# Patient Record
Sex: Female | Born: 1985
Health system: Southern US, Community
[De-identification: ages and names within clinical notes are randomized; demographics above are authoritative.]

## PROBLEM LIST (undated history)

## (undated) ENCOUNTER — Inpatient Hospital Stay (HOSPITAL_COMMUNITY): Payer: Self-pay

## (undated) DIAGNOSIS — F1010 Alcohol abuse, uncomplicated: Secondary | ICD-10-CM

## (undated) DIAGNOSIS — I10 Essential (primary) hypertension: Secondary | ICD-10-CM

## (undated) DIAGNOSIS — F319 Bipolar disorder, unspecified: Secondary | ICD-10-CM

## (undated) DIAGNOSIS — W3400XA Accidental discharge from unspecified firearms or gun, initial encounter: Secondary | ICD-10-CM

## (undated) DIAGNOSIS — K219 Gastro-esophageal reflux disease without esophagitis: Secondary | ICD-10-CM

## (undated) DIAGNOSIS — R011 Cardiac murmur, unspecified: Secondary | ICD-10-CM

## (undated) DIAGNOSIS — K3184 Gastroparesis: Secondary | ICD-10-CM

## (undated) DIAGNOSIS — J302 Other seasonal allergic rhinitis: Secondary | ICD-10-CM

---
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
## 2005-07-15 ENCOUNTER — Emergency Department (HOSPITAL_COMMUNITY): Admission: EM | Admit: 2005-07-15 | Discharge: 2005-07-15 | Payer: Self-pay | Admitting: Emergency Medicine

## 2007-01-09 ENCOUNTER — Emergency Department (HOSPITAL_COMMUNITY): Admission: EM | Admit: 2007-01-09 | Discharge: 2007-01-09 | Payer: Self-pay | Admitting: Family Medicine

## 2008-03-22 ENCOUNTER — Emergency Department (HOSPITAL_COMMUNITY): Admission: EM | Admit: 2008-03-22 | Discharge: 2008-03-22 | Payer: Self-pay | Admitting: Emergency Medicine

## 2009-04-09 ENCOUNTER — Emergency Department (HOSPITAL_COMMUNITY): Admission: EM | Admit: 2009-04-09 | Discharge: 2009-04-09 | Payer: Self-pay | Admitting: Family Medicine

## 2009-04-09 ENCOUNTER — Emergency Department (HOSPITAL_COMMUNITY): Admission: EM | Admit: 2009-04-09 | Discharge: 2009-04-09 | Payer: Self-pay | Admitting: Emergency Medicine

## 2009-04-11 ENCOUNTER — Emergency Department (HOSPITAL_COMMUNITY): Admission: EM | Admit: 2009-04-11 | Discharge: 2009-04-11 | Payer: Self-pay | Admitting: Emergency Medicine

## 2010-05-25 ENCOUNTER — Emergency Department (HOSPITAL_COMMUNITY): Admission: EM | Admit: 2010-05-25 | Discharge: 2010-05-25 | Payer: Self-pay | Admitting: Family Medicine

## 2011-03-02 LAB — URINALYSIS, ROUTINE W REFLEX MICROSCOPIC
Bilirubin Urine: NEGATIVE
Glucose, UA: NEGATIVE mg/dL
Ketones, ur: 15 mg/dL — AB
Nitrite: NEGATIVE
Protein, ur: NEGATIVE mg/dL
Specific Gravity, Urine: 1.02 (ref 1.005–1.030)
Urobilinogen, UA: 0.2 mg/dL (ref 0.0–1.0)
pH: 5.5 (ref 5.0–8.0)

## 2011-03-02 LAB — COMPREHENSIVE METABOLIC PANEL
Albumin: 4.1 g/dL (ref 3.5–5.2)
CO2: 22 mEq/L (ref 19–32)
CO2: 26 mEq/L (ref 19–32)
Calcium: 9.6 mg/dL (ref 8.4–10.5)
Chloride: 105 mEq/L (ref 96–112)
Creatinine, Ser: 0.84 mg/dL (ref 0.4–1.2)
Creatinine, Ser: 0.84 mg/dL (ref 0.4–1.2)
GFR calc Af Amer: >60 mL/min (ref >60)
GFR calc non Af Amer: >60 mL/min (ref >60)
Glucose, Bld: 120 mg/dL — ABNORMAL HIGH (ref 70–99)
Glucose, Bld: 103 mg/dL — ABNORMAL HIGH (ref 70–99)
Potassium: 3.9 mEq/L (ref 3.5–5.1)
Potassium: 3.5 mEq/L (ref 3.5–5.1)
Sodium: 140 mEq/L (ref 135–145)
Total Protein: 7.8 g/dL (ref 6.0–8.3)

## 2011-03-02 LAB — DIFFERENTIAL
Basophils Absolute: 0 10*3/uL (ref 0.0–0.1)
Basophils Relative: 0 % (ref 0–1)
Eosinophils Absolute: 0 10*3/uL (ref 0.0–0.7)
Eosinophils Relative: 0 % (ref 0–5)
Lymphocytes Relative: 15 % (ref 12–46)
Lymphs Abs: 0.8 10*3/uL (ref 0.7–4.0)
Monocytes Absolute: 0.1 10*3/uL (ref 0.1–1.0)
Monocytes Relative: 1 % — ABNORMAL LOW (ref 3–12)
Monocytes Relative: 5 % (ref 3–12)
Neutrophils Relative %: 80 % — ABNORMAL HIGH (ref 43–77)

## 2011-03-02 LAB — CBC
Platelets: 122 10*3/uL — ABNORMAL LOW (ref 150–400)
Platelets: 126 10*3/uL — ABNORMAL LOW (ref 150–400)
RBC: 4.08 MIL/uL (ref 3.87–5.11)
RDW: 13.6 % (ref 11.5–15.5)
WBC: 10.3 10*3/uL (ref 4.0–10.5)
WBC: 9.2 10*3/uL (ref 4.0–10.5)

## 2011-03-02 LAB — POCT URINALYSIS DIP (DEVICE)
Hgb urine dipstick: NEGATIVE
Protein, ur: 30 mg/dL — AB
Urobilinogen, UA: 1 mg/dL (ref 0.0–1.0)

## 2011-03-02 LAB — WET PREP, GENITAL: Trich, Wet Prep: NONE SEEN

## 2011-03-02 LAB — LIPASE, BLOOD: Lipase: 26 U/L (ref 11–59)

## 2011-03-27 ENCOUNTER — Ambulatory Visit (INDEPENDENT_AMBULATORY_CARE_PROVIDER_SITE_OTHER): Payer: Self-pay

## 2011-03-27 ENCOUNTER — Inpatient Hospital Stay (INDEPENDENT_AMBULATORY_CARE_PROVIDER_SITE_OTHER)
Admission: RE | Admit: 2011-03-27 | Discharge: 2011-03-27 | Disposition: A | Discharge status: Home / Self Care | Payer: Self-pay | Source: Ambulatory Visit | Attending: Family Medicine | Admitting: Family Medicine

## 2011-03-27 DIAGNOSIS — R0789 Other chest pain: Secondary | ICD-10-CM

## 2012-03-31 ENCOUNTER — Emergency Department (INDEPENDENT_AMBULATORY_CARE_PROVIDER_SITE_OTHER)
Admission: EM | Admit: 2012-03-31 | Discharge: 2012-03-31 | Disposition: A | Discharge status: Home / Self Care | Payer: Commercial Indemnity | Source: Home / Self Care | Attending: Emergency Medicine | Admitting: Emergency Medicine

## 2012-03-31 ENCOUNTER — Encounter (HOSPITAL_COMMUNITY): Payer: Self-pay

## 2012-03-31 DIAGNOSIS — K5289 Other specified noninfective gastroenteritis and colitis: Secondary | ICD-10-CM

## 2012-03-31 DIAGNOSIS — K529 Noninfective gastroenteritis and colitis, unspecified: Secondary | ICD-10-CM

## 2012-03-31 HISTORY — DX: Other seasonal allergic rhinitis: J30.2

## 2012-03-31 LAB — POCT URINALYSIS DIP (DEVICE)
Glucose, UA: NEGATIVE mg/dL
Protein, ur: 100 mg/dL — AB

## 2012-03-31 LAB — POCT PREGNANCY, URINE: Preg Test, Ur: NEGATIVE

## 2012-03-31 MED ORDER — ACIDOPHILUS PROBIOTIC BLEND PO CAPS: 2.0000 | ORAL_CAPSULE | Freq: Two times a day (BID) | ORAL | Status: DC

## 2012-03-31 MED ORDER — ONDANSETRON 4 MG PO TBDP
4.0000 mg | ORAL_TABLET | Freq: Once | ORAL | Status: AC
  Administered 2012-03-31: 4 mg via ORAL

## 2012-03-31 MED ORDER — ONDANSETRON HCL 4 MG/2ML IJ SOLN
INTRAMUSCULAR | Status: AC
  Filled 2012-03-31: qty 2

## 2012-03-31 MED ORDER — ONDANSETRON 8 MG PO TBDP: ORAL_TABLET | ORAL | Status: AC

## 2012-03-31 MED ORDER — ONDANSETRON 4 MG PO TBDP
ORAL_TABLET | ORAL | Status: AC
  Filled 2012-03-31: qty 1

## 2012-03-31 MED ORDER — ONDANSETRON HCL 4 MG/2ML IJ SOLN
4.0000 mg | Freq: Once | INTRAMUSCULAR | Status: AC
  Administered 2012-03-31: 4 mg via INTRAMUSCULAR

## 2012-03-31 NOTE — Discharge Instructions (Signed)
Return to the ED for the symptoms we discussed.

## 2012-03-31 NOTE — ED Notes (Signed)
C/o upper abdominal area pain and vomiting since yesterday; some ETOH use (last yesterday)

## 2012-03-31 NOTE — ED Provider Notes (Signed)
History     CSN: [id number]  Arrival date & time 03/31/12  1420   First MD Initiated Contact with Patient 03/31/12 1453      Chief Complaint  Patient presents with  . Abdominal Pain    (Consider location/radiation/quality/duration/timing/severity/associated sxs/prior treatment) HPI Comments: Patient reports having one alcoholic beverage, and eating some questionable potato salad last night . Reports multiple episodes of NBNB emesis about 6 hours later, and some upper crampy abdominal pain.  Not sure if others are ill as well. About 2 episodes of watery, nonbloody diarrhea. No abdominal pain. States that she is unable to tolerate any by mouth. No change in urine output. No dizziness, lightheadedness, presyncope, syncope. No chest pain, shortness of breath. No urinary complaints. No back pain. No aggravating or alleviating factors. She's not tried anything for her symptoms. Had some blood flecked emesis while in department. No history of alcohol abuse, pancreatitis, PUD.  ROS as noted in HPI. All other ROS negative.   Patient is a 26 y.o. female presenting with vomiting. The history is provided by the patient. No language interpreter was used.  Emesis  This is a new problem. The current episode started 6 to 12 hours ago. The problem occurs 5 to 10 times per day. The problem has not changed since onset.The emesis has an appearance of bright red blood and stomach contents. There has been no fever. Associated symptoms include abdominal pain. Pertinent negatives include no arthralgias, no cough, no diarrhea, no fever, no headaches, no myalgias and no URI. Risk factors include suspect food intake.    Past Medical History  Diagnosis Date  . Seasonal allergies     History reviewed. No pertinent past surgical history.  History reviewed. No pertinent family history.  History  Substance Use Topics  . Smoking status: Current Everyday Smoker  . Smokeless tobacco: Not on file  . Alcohol Use:  Yes    OB History    Grav Para Term Preterm Abortions TAB SAB Ect Mult Living                  Review of Systems  Constitutional: Negative for fever.  Respiratory: Negative for cough.   Gastrointestinal: Positive for vomiting and abdominal pain. Negative for diarrhea.  Musculoskeletal: Negative for myalgias and arthralgias.  Neurological: Negative for headaches.    Allergies  Review of patient's allergies indicates no known allergies.  Home Medications   Current Outpatient Rx  Name Route Sig Dispense Refill  . ONDANSETRON 8 MG PO TBDP  1/2- 1 tablet q 8 hr prn nausea, vomiting 20 tablet 0  . ACIDOPHILUS PROBIOTIC BLEND PO CAPS Oral Take 2 capsules by mouth 2 (two) times daily. 60 capsule 0    BP 138/98  Pulse 92  Temp(Src) 98 F (36.7 C) (Oral)  Resp 20  SpO2 97%  LMP 03/19/2012  Filed Vitals:   03/31/12 1600 03/31/12 1625 03/31/12 1626 03/31/12 1627  BP: 146/112 140/90 147/100 138/98  Pulse: 84 65 80 92  Temp:      TempSrc:      Resp:      SpO2:         Physical Exam  Nursing note and vitals reviewed. Constitutional: She is oriented to person, place, and time. She appears well-developed and well-nourished.  HENT:  Head: Normocephalic and atraumatic.  Eyes: Conjunctivae and EOM are normal.  Neck: Normal range of motion.  Cardiovascular: Normal rate, regular rhythm, normal heart sounds and intact distal pulses.   Pulmonary/Chest:  Effort normal and breath sounds normal. She exhibits no tenderness.  Abdominal: Soft. Normal appearance and bowel sounds are normal. She exhibits no distension. There is tenderness. There is no rebound, no guarding and no CVA tenderness.    Musculoskeletal: Normal range of motion. She exhibits no edema and no tenderness.  Neurological: She is alert and oriented to person, place, and time.  Skin: Skin is warm and dry.  Psychiatric: She has a normal mood and affect. Her behavior is normal. Judgment and thought content normal.     ED Course  Procedures (including critical care time)  Labs Reviewed  POCT URINALYSIS DIP (DEVICE) - Abnormal; Notable for the following:    Hgb urine dipstick SMALL (*)    Protein, ur 100 (*)    Leukocytes, UA SMALL (*) Biochemical Testing Only. Please order routine urinalysis from main lab if confirmatory testing is needed.   All other components within normal limits  POCT PREGNANCY, URINE   No results found.   1. Gastroenteritis     Results for orders placed during the hospital encounter of 03/31/12  POCT URINALYSIS DIP (DEVICE)      Component Value Range   Glucose, UA NEGATIVE  NEGATIVE (mg/dL)   Bilirubin Urine NEGATIVE  NEGATIVE    Ketones, ur NEGATIVE  NEGATIVE (mg/dL)   Specific Gravity, Urine >=1.030  1.005 - 1.030    Hgb urine dipstick SMALL (*) NEGATIVE    pH 6.0  5.0 - 8.0    Protein, ur 100 (*) NEGATIVE (mg/dL)   Urobilinogen, UA 1.0  0.0 - 1.0 (mg/dL)   Nitrite NEGATIVE  NEGATIVE    Leukocytes, UA SMALL (*) NEGATIVE   POCT PREGNANCY, URINE      Component Value Range   Preg Test, Ur NEGATIVE  NEGATIVE      MDM   patient much improved after the Zofran IM.  Has received 8 mg of Zofran total. Mild abdominal tenderness left upper quadrant, consistent with a gastritis. Abdomen is otherwise benign. She is currently pain-free. Small amount of blood in emesis while in department. This is most likely from repeated vomiting. Doubt pancreatitis, cholelithiasis, diverticulitis, Mallory-Weiss tear, significant UGI bleed at this time. Will check orthostatics, by mouth challenge. If she is able to tolerate this, will send her home with oral rehydration, and Zofran. Otherwise, transferring to the ED for IV fluids. Patient agrees with plan..   Pt orthostatic, urine concentrated. She drank entire can of ginger ale wile in dept. Repeat abd exam benign. Wants to try oral rehydration at home. Discussed signs and symptoms that should prompt patient's return to the ED. Patient  agrees with plan.  Luiz Blare, MD 03/31/12 2239

## 2012-03-31 NOTE — ED Notes (Signed)
Soon after PO zofran given, pt still vomiting

## 2012-07-31 ENCOUNTER — Emergency Department (HOSPITAL_COMMUNITY)
Admission: EM | Admit: 2012-07-31 | Discharge: 2012-07-31 | Disposition: A | Discharge status: Home / Self Care | Payer: Self-pay | Attending: Emergency Medicine | Admitting: Emergency Medicine

## 2012-07-31 ENCOUNTER — Encounter (HOSPITAL_COMMUNITY): Payer: Self-pay | Admitting: Physical Medicine and Rehabilitation

## 2012-07-31 DIAGNOSIS — Z9109 Other allergy status, other than to drugs and biological substances: Secondary | ICD-10-CM | POA: Insufficient documentation

## 2012-07-31 DIAGNOSIS — F1020 Alcohol dependence, uncomplicated: Secondary | ICD-10-CM | POA: Insufficient documentation

## 2012-07-31 DIAGNOSIS — F17200 Nicotine dependence, unspecified, uncomplicated: Secondary | ICD-10-CM | POA: Insufficient documentation

## 2012-07-31 DIAGNOSIS — F1920 Other psychoactive substance dependence, uncomplicated: Secondary | ICD-10-CM | POA: Insufficient documentation

## 2012-07-31 DIAGNOSIS — F1910 Other psychoactive substance abuse, uncomplicated: Secondary | ICD-10-CM

## 2012-07-31 DIAGNOSIS — R030 Elevated blood-pressure reading, without diagnosis of hypertension: Secondary | ICD-10-CM | POA: Insufficient documentation

## 2012-07-31 LAB — COMPREHENSIVE METABOLIC PANEL
ALT: 13 U/L (ref 0–35)
Alkaline Phosphatase: 74 U/L (ref 39–117)
BUN: 12 mg/dL (ref 6–23)
CO2: 26 mEq/L (ref 19–32)
Calcium: 10.3 mg/dL (ref 8.4–10.5)
GFR calc Af Amer: >90 mL/min (ref >90)
GFR calc non Af Amer: >90 mL/min (ref >90)
Glucose, Bld: 89 mg/dL (ref 70–99)
Sodium: 136 mEq/L (ref 135–145)

## 2012-07-31 LAB — CBC WITH DIFFERENTIAL/PLATELET
Eosinophils Relative: 3 % (ref 0–5)
HCT: 41.3 % (ref 36.0–46.0)
Hemoglobin: 14 g/dL (ref 12.0–15.0)
Lymphocytes Relative: 32 % (ref 12–46)
Lymphs Abs: 3 10*3/uL (ref 0.7–4.0)
MCV: 97.9 fL (ref 78.0–100.0)
Monocytes Relative: 8 % (ref 3–12)
Platelets: 170 10*3/uL (ref 150–400)
RBC: 4.22 MIL/uL (ref 3.87–5.11)
WBC: 9.5 10*3/uL (ref 4.0–10.5)

## 2012-07-31 LAB — RAPID URINE DRUG SCREEN, HOSP PERFORMED: Amphetamines: NOT DETECTED

## 2012-07-31 NOTE — ED Notes (Addendum)
Pt presents to department for evaluation of medical clearance. States she started treatment at [facility] today for detox from ETOH and marijuana. Sent here for possible withdrawal symptoms, states she became nauseated and began vomiting after lunch. Upon arrival pt is alert and oriented x4, skin warm and dry. No tremors noted. Denies SI/HI.

## 2012-07-31 NOTE — ED Provider Notes (Signed)
History  This chart was scribed for Doug Sou, MD by Bennett Scrape. This patient was seen in room TR10C/TR10C and the patient's care was started at 5:44PM.  CSN: [id number]  Arrival date & time 07/31/12  1407   First MD Initiated Contact with Patient 07/31/12 1744      Chief Complaint  Patient presents with  . Medical Clearance     The history is provided by the patient. No language interpreter was used.    Taylor Bartlett is a 26 y.o. female who presents to the Emergency Department from [facility] for medical clearance. She reports that she is being seen at [facility] for EtOH and marijuana. She states that she was sent here from [facility] after vomiting after eating a food that typical upsets her stomach which staff thought was a sign of withdrawal. She denies SI, HI and any other symptoms currently. She does not have a h/o chronic medical conditions. She is a current everyday smoker and occasional alcohol user.  Past Medical History  Diagnosis Date  . Seasonal allergies     No past surgical history on file.  No family history on file.  History  Substance Use Topics  . Smoking status: Current Everyday Smoker  . Smokeless tobacco: Not on file  . Alcohol Use: Yes    No OB history provided.  Review of Systems  Constitutional: Negative.   HENT: Negative.   Respiratory: Negative.   Cardiovascular: Negative.   Gastrointestinal: Negative.   Musculoskeletal: Negative.   Skin: Negative.   Neurological: Negative.   Hematological: Negative.   Psychiatric/Behavioral: Negative.     Allergies  Review of patient's allergies indicates no known allergies.  Home Medications  No current outpatient prescriptions on file.  Triage Vitals: BP 139/96  Pulse 78  Temp 98.5 F (36.9 C) (Oral)  Resp 14  SpO2 100%  Physical Exam  Nursing note and vitals reviewed. Constitutional: She appears well-developed and well-nourished.  HENT:  Head: Normocephalic and atraumatic.    Eyes: Conjunctivae are normal. Pupils are equal, round, and reactive to light.  Neck: Neck supple. No tracheal deviation present. No thyromegaly present.  Cardiovascular: Normal rate and regular rhythm.   No murmur heard. Pulmonary/Chest: Effort normal and breath sounds normal.  Abdominal: Soft. Bowel sounds are normal. She exhibits no distension. There is no tenderness.  Musculoskeletal: Normal range of motion. She exhibits no edema and no tenderness.  Neurological: She is alert. Coordination normal.  Skin: Skin is warm and dry. No rash noted.  Psychiatric: She has a normal mood and affect.    ED Course  Procedures (including critical care time)  DIAGNOSTIC STUDIES: Oxygen Saturation is 100% on room air, normal by my interpretation.    COORDINATION OF CARE: 6:00:PM-Discussed treatment plan with pt at bedside and pt agreed to plan.   Labs Reviewed  URINE RAPID DRUG SCREEN (HOSP PERFORMED) - Abnormal; Notable for the following:    Tetrahydrocannabinol POSITIVE (*)     All other components within normal limits  COMPREHENSIVE METABOLIC PANEL - Abnormal; Notable for the following:    Total Protein 8.5 (*)     All other components within normal limits  CBC WITH DIFFERENTIAL  ETHANOL   No results found.   No diagnosis found.  Results for orders placed during the hospital encounter of 07/31/12  URINE RAPID DRUG SCREEN (HOSP PERFORMED)      Component Value Range   Opiates NONE DETECTED  NONE DETECTED   Cocaine NONE DETECTED  NONE DETECTED  Benzodiazepines NONE DETECTED  NONE DETECTED   Amphetamines NONE DETECTED  NONE DETECTED   Tetrahydrocannabinol POSITIVE (*) NONE DETECTED   Barbiturates NONE DETECTED  NONE DETECTED  CBC WITH DIFFERENTIAL      Component Value Range   WBC 9.5  4.0 - 10.5 K/uL   RBC 4.22  3.87 - 5.11 MIL/uL   Hemoglobin 14.0  12.0 - 15.0 g/dL   HCT 16.1  09.6 - 04.5 %   MCV 97.9  78.0 - 100.0 fL   MCH 33.2  26.0 - 34.0 pg   MCHC 33.9  30.0 - 36.0  g/dL   RDW 40.9  81.1 - 91.4 %   Platelets 170  150 - 400 K/uL   Neutrophils Relative 58  43 - 77 %   Neutro Abs 5.5  1.7 - 7.7 K/uL   Lymphocytes Relative 32  12 - 46 %   Lymphs Abs 3.0  0.7 - 4.0 K/uL   Monocytes Relative 8  3 - 12 %   Monocytes Absolute 0.8  0.1 - 1.0 K/uL   Eosinophils Relative 3  0 - 5 %   Eosinophils Absolute 0.3  0.0 - 0.7 K/uL   Basophils Relative 0  0 - 1 %   Basophils Absolute 0.0  0.0 - 0.1 K/uL  COMPREHENSIVE METABOLIC PANEL      Component Value Range   Sodium 136  135 - 145 mEq/L   Potassium 4.0  3.5 - 5.1 mEq/L   Chloride 98  96 - 112 mEq/L   CO2 26  19 - 32 mEq/L   Glucose, Bld 89  70 - 99 mg/dL   BUN 12  6 - 23 mg/dL   Creatinine, Ser 7.82  0.50 - 1.10 mg/dL   Calcium 95.6  8.4 - 21.3 mg/dL   Total Protein 8.5 (*) 6.0 - 8.3 g/dL   Albumin 4.6  3.5 - 5.2 g/dL   AST 23  0 - 37 U/L   ALT 13  0 - 35 U/L   Alkaline Phosphatase 74  39 - 117 U/L   Total Bilirubin 0.4  0.3 - 1.2 mg/dL   GFR calc non Af Amer >90  >90 mL/min   GFR calc Af Amer >90  >90 mL/min  ETHANOL      Component Value Range   Alcohol, Ethyl (B) <11  0 - 11 mg/dL   No results found.   MDM  Denies any abdominal pain states certain types of food makes her vomit she does not feel that she is in withdrawal from substances and she does not appear to be withdrawn from substances and feels well presently She is stable for treatment at [place]. BP should be rechecked in 3 weeks Diagnosis  #1polysubstance abuse #2 elevated blood pressure       I personally performed the services described in this documentation, which was scribed in my presence. The recorded information has been reviewed and considered.   Doug Sou, MD 07/31/12 [phone]

## 2012-08-04 ENCOUNTER — Encounter (HOSPITAL_BASED_OUTPATIENT_CLINIC_OR_DEPARTMENT_OTHER): Payer: Self-pay | Admitting: Emergency Medicine

## 2012-08-04 ENCOUNTER — Emergency Department (HOSPITAL_BASED_OUTPATIENT_CLINIC_OR_DEPARTMENT_OTHER)
Admission: EM | Admit: 2012-08-04 | Discharge: 2012-08-04 | Disposition: A | Discharge status: Home / Self Care | Payer: Self-pay | Attending: Emergency Medicine | Admitting: Emergency Medicine

## 2012-08-04 DIAGNOSIS — F17200 Nicotine dependence, unspecified, uncomplicated: Secondary | ICD-10-CM | POA: Insufficient documentation

## 2012-08-04 DIAGNOSIS — I10 Essential (primary) hypertension: Secondary | ICD-10-CM | POA: Insufficient documentation

## 2012-08-04 MED ORDER — HYDROCHLOROTHIAZIDE 25 MG PO TABS: 12.5000 mg | ORAL_TABLET | Freq: Every day | ORAL | Status: DC

## 2012-08-04 NOTE — ED Provider Notes (Signed)
History     CSN: [id number]  Arrival date & time 08/04/12  1842   First MD Initiated Contact with Patient 08/04/12 2046      Chief Complaint  Patient presents with  . Hypertension    (Consider location/radiation/quality/duration/timing/severity/associated sxs/prior treatment) Patient is a 26 y.o. female presenting with hypertension. The history is provided by the patient. No language interpreter was used.  Hypertension This is a new problem. The current episode started 1 to 4 weeks ago. The problem occurs constantly. The problem has been unchanged. Nothing aggravates the symptoms. She has tried nothing for the symptoms. The treatment provided no relief.  Pt here from [facility].  Pt has had multiple elevated blood pressures. Pt was seen at [facility] 4 days ago.  Pt was seen at [facility] today and sent here for continued elevated blood pressure  Past Medical History  Diagnosis Date  . Seasonal allergies     History reviewed. No pertinent past surgical history.  No family history on file.  History  Substance Use Topics  . Smoking status: Current Every Day Smoker  . Smokeless tobacco: Not on file  . Alcohol Use: Yes    OB History    Grav Para Term Preterm Abortions TAB SAB Ect Mult Living                  Review of Systems  All other systems reviewed and are negative.    Allergies  Tomato  Home Medications   Current Outpatient Rx  Name Route Sig Dispense Refill  . FLUOXETINE HCL 20 MG PO TABS Oral Take 20 mg by mouth daily.    . TRAZODONE HCL 100 MG PO TABS Oral Take 100 mg by mouth at bedtime.      BP 131/87  Pulse 80  Temp 97.9 F (36.6 C) (Oral)  Resp 18  Ht 5\' 2"  (1.575 m)  Wt 104 lb (47.174 kg)  BMI 19.02 kg/m2  SpO2 100%  Physical Exam  Nursing note and vitals reviewed. Constitutional: She appears well-developed and well-nourished.  HENT:  Head: Normocephalic and atraumatic.  Right Ear: External ear normal.  Left Ear: External ear normal.    Nose: Nose normal.  Mouth/Throat: Oropharynx is clear and moist.  Eyes: Conjunctivae normal and EOM are normal. Pupils are equal, round, and reactive to light.  Neck: Normal range of motion. Neck supple.  Cardiovascular: Normal rate.   Pulmonary/Chest: Effort normal and breath sounds normal.  Abdominal: Soft.  Musculoskeletal: Normal range of motion.  Neurological: She is alert.  Skin: Skin is warm.  Psychiatric: She has a normal mood and affect.    ED Course  Procedures (including critical care time)  Labs Reviewed - No data to display No results found.   1. Hypertension       MDM  hctz        Lonia Skinner [place], [place] 08/04/12 2118

## 2012-08-04 NOTE — ED Notes (Signed)
Pt here for high blood pressure x 1 week. Pt is currently in treatment at [facility] for polysubstance abuse. Last pressure taken at [facility] was 156/112

## 2012-08-04 NOTE — ED Notes (Signed)
Patient ambulates to restroom without difficulty or assistance.  

## 2012-08-05 NOTE — ED Provider Notes (Signed)
Medical screening examination/treatment/procedure(s) were performed by non-physician practitioner and as supervising physician I was immediately available for consultation/collaboration.  Doug Sou, MD 08/05/12 [phone]

## 2012-08-07 ENCOUNTER — Encounter (HOSPITAL_BASED_OUTPATIENT_CLINIC_OR_DEPARTMENT_OTHER): Payer: Self-pay | Admitting: *Deleted

## 2012-08-07 ENCOUNTER — Emergency Department (HOSPITAL_BASED_OUTPATIENT_CLINIC_OR_DEPARTMENT_OTHER)
Admission: EM | Admit: 2012-08-07 | Discharge: 2012-08-07 | Disposition: A | Discharge status: Rehabilitation Facility | Payer: Self-pay | Attending: Emergency Medicine | Admitting: Emergency Medicine

## 2012-08-07 DIAGNOSIS — I10 Essential (primary) hypertension: Secondary | ICD-10-CM | POA: Insufficient documentation

## 2012-08-07 DIAGNOSIS — F17200 Nicotine dependence, unspecified, uncomplicated: Secondary | ICD-10-CM | POA: Insufficient documentation

## 2012-08-07 DIAGNOSIS — K219 Gastro-esophageal reflux disease without esophagitis: Secondary | ICD-10-CM | POA: Insufficient documentation

## 2012-08-07 DIAGNOSIS — N390 Urinary tract infection, site not specified: Secondary | ICD-10-CM

## 2012-08-07 DIAGNOSIS — N949 Unspecified condition associated with female genital organs and menstrual cycle: Secondary | ICD-10-CM | POA: Insufficient documentation

## 2012-08-07 HISTORY — DX: Essential (primary) hypertension: I10

## 2012-08-07 HISTORY — DX: Alcohol abuse, uncomplicated: F10.10

## 2012-08-07 LAB — CBC WITH DIFFERENTIAL/PLATELET
Basophils Absolute: 0 10*3/uL (ref 0.0–0.1)
Basophils Relative: 0 % (ref 0–1)
Eosinophils Relative: 1 % (ref 0–5)
HCT: 42.1 % (ref 36.0–46.0)
MCH: 33 pg (ref 26.0–34.0)
MCHC: 35.4 g/dL (ref 30.0–36.0)
MCV: 93.1 fL (ref 78.0–100.0)
Monocytes Absolute: 0.8 10*3/uL (ref 0.1–1.0)
RDW: 11.6 % (ref 11.5–15.5)

## 2012-08-07 LAB — BASIC METABOLIC PANEL
CO2: 27 mEq/L (ref 19–32)
Calcium: 10.3 mg/dL (ref 8.4–10.5)
Creatinine, Ser: 0.8 mg/dL (ref 0.50–1.10)
GFR calc Af Amer: >90 mL/min (ref >90)

## 2012-08-07 LAB — URINALYSIS, ROUTINE W REFLEX MICROSCOPIC
Glucose, UA: NEGATIVE mg/dL
Hgb urine dipstick: NEGATIVE
Ketones, ur: NEGATIVE mg/dL
Protein, ur: 100 mg/dL — AB

## 2012-08-07 LAB — URINE MICROSCOPIC-ADD ON

## 2012-08-07 MED ORDER — PHENAZOPYRIDINE HCL 200 MG PO TABS: 200.0000 mg | ORAL_TABLET | Freq: Three times a day (TID) | ORAL | Status: DC | PRN

## 2012-08-07 MED ORDER — SULFAMETHOXAZOLE-TRIMETHOPRIM 800-160 MG PO TABS: 1.0000 | ORAL_TABLET | Freq: Two times a day (BID) | ORAL | Status: DC

## 2012-08-07 NOTE — ED Notes (Signed)
Patient states she developed bilateral lower pelvic pain today at 0600.  States she developed nausea and vomiting x 3 with some bloody secretions.  Hx of alcoholism and is currently at [facility].  States she has had bloody emesis when she was drinking heavy.

## 2012-08-07 NOTE — ED Provider Notes (Signed)
History     CSN: [id number]  Arrival date & time 08/07/12  1004   First MD Initiated Contact with Patient 08/07/12 1107      Chief Complaint  Patient presents with  . Pelvic Pain    (Consider location/radiation/quality/duration/timing/severity/associated sxs/prior treatment) HPI Comments: Patient is currently at [facility] for inpatient treatment of alcoholism.  She started this morning with lower abdominal pains.  She tried to go to the bathroom and it seemed to worsen.  She denies any fevers or chills.  She denies urinary complaints.  No vaginal bleeding or discharge.  She is sexually active but with females.    Patient is a 26 y.o. female presenting with pelvic pain. The history is provided by the patient.  Pelvic Pain This is a new problem. Episode onset: this morning. The problem occurs constantly. The problem has been gradually worsening. Associated symptoms include abdominal pain. Nothing aggravates the symptoms. Nothing relieves the symptoms. She has tried nothing for the symptoms.    Past Medical History  Diagnosis Date  . Seasonal allergies   . Alcohol abuse   . Hypertension   . Reflux   . Seizures   . Anxiety     History reviewed. No pertinent past surgical history.  No family history on file.  History  Substance Use Topics  . Smoking status: Current Every Day Smoker    Types: Cigarettes  . Smokeless tobacco: Not on file  . Alcohol Use: Yes     clean 07/31/12    OB History    Grav Para Term Preterm Abortions TAB SAB Ect Mult Living                  Review of Systems  Gastrointestinal: Positive for abdominal pain.  Genitourinary: Positive for pelvic pain.  All other systems reviewed and are negative.    Allergies  Tomato  Home Medications   Current Outpatient Rx  Name Route Sig Dispense Refill  . DIPHENHYDRAMINE HCL (SLEEP) 25 MG PO TABS Oral Take 25 mg by mouth every 6 (six) hours.    . GUAIFENESIN ER 600 MG PO TB12 Oral Take 1,200 mg by mouth 2  (two) times daily.    Marland Kitchen OMEPRAZOLE 20 MG PO CPDR Oral Take 20 mg by mouth daily.    Marland Kitchen FLUOXETINE HCL 20 MG PO TABS Oral Take 20 mg by mouth daily.    Marland Kitchen HYDROCHLOROTHIAZIDE 25 MG PO TABS Oral Take 0.5 tablets (12.5 mg total) by mouth daily. 30 tablet 1  . TRAZODONE HCL 100 MG PO TABS Oral Take 100 mg by mouth at bedtime.      BP 168/103  Pulse 84  Temp 98.3 F (36.8 C) (Oral)  Resp 18  Ht 5\' 2"  (1.575 m)  Wt 103 lb (46.72 kg)  BMI 18.84 kg/m2  SpO2 100%  LMP 07/11/2012  Physical Exam  Nursing note and vitals reviewed. Constitutional: She is oriented to person, place, and time. She appears well-developed and well-nourished. No distress.  HENT:  Head: Normocephalic and atraumatic.  Mouth/Throat: Oropharynx is clear and moist.  Neck: Normal range of motion.  Cardiovascular: Normal rate and regular rhythm.   No murmur heard. Pulmonary/Chest: Effort normal and breath sounds normal.  Abdominal: Soft. Bowel sounds are normal. She exhibits no distension.       There is ttp in the suprapubic region and right lower quadrant with no rebound or guarding.  Bowel sounds are present.  Musculoskeletal: Normal range of motion. She exhibits no edema.  Neurological: She is alert and oriented to person, place, and time.  Skin: Skin is warm and dry. She is not diaphoretic.    ED Course  Procedures (including critical care time)   Labs Reviewed  PREGNANCY, URINE  URINALYSIS, ROUTINE W REFLEX MICROSCOPIC  CBC WITH DIFFERENTIAL  BASIC METABOLIC PANEL   No results found.   No diagnosis found.    MDM  The patient presents here with suprapubic discomfort and a ua that suggests a uti.  There is no elevation of wbc to suggest appendicitis and she denies any vaginal discharge or bleeding.  She will be treated with bactrim and pyridium, to return prn if her symptoms worsen.        Geoffery Lyons, MD 08/07/12 1236

## 2013-03-29 ENCOUNTER — Emergency Department (HOSPITAL_COMMUNITY)
Admission: EM | Admit: 2013-03-29 | Discharge: 2013-03-30 | Disposition: A | Discharge status: Home / Self Care | Payer: 59 | Attending: Emergency Medicine | Admitting: Emergency Medicine

## 2013-03-29 ENCOUNTER — Encounter (HOSPITAL_COMMUNITY): Payer: Self-pay | Admitting: Emergency Medicine

## 2013-03-29 ENCOUNTER — Emergency Department (HOSPITAL_COMMUNITY)
Admission: EM | Admit: 2013-03-29 | Discharge: 2013-03-29 | Disposition: A | Discharge status: Nursing Home (Includes ICF) | Payer: 59 | Source: Home / Self Care | Attending: Family Medicine | Admitting: Family Medicine

## 2013-03-29 DIAGNOSIS — F17200 Nicotine dependence, unspecified, uncomplicated: Secondary | ICD-10-CM | POA: Insufficient documentation

## 2013-03-29 DIAGNOSIS — R112 Nausea with vomiting, unspecified: Secondary | ICD-10-CM | POA: Insufficient documentation

## 2013-03-29 DIAGNOSIS — Z8719 Personal history of other diseases of the digestive system: Secondary | ICD-10-CM | POA: Insufficient documentation

## 2013-03-29 DIAGNOSIS — N390 Urinary tract infection, site not specified: Secondary | ICD-10-CM | POA: Insufficient documentation

## 2013-03-29 DIAGNOSIS — R197 Diarrhea, unspecified: Secondary | ICD-10-CM

## 2013-03-29 DIAGNOSIS — R1084 Generalized abdominal pain: Secondary | ICD-10-CM | POA: Insufficient documentation

## 2013-03-29 DIAGNOSIS — I10 Essential (primary) hypertension: Secondary | ICD-10-CM | POA: Insufficient documentation

## 2013-03-29 DIAGNOSIS — Z8659 Personal history of other mental and behavioral disorders: Secondary | ICD-10-CM | POA: Insufficient documentation

## 2013-03-29 DIAGNOSIS — A0811 Acute gastroenteropathy due to Norwalk agent: Secondary | ICD-10-CM

## 2013-03-29 DIAGNOSIS — Z8669 Personal history of other diseases of the nervous system and sense organs: Secondary | ICD-10-CM | POA: Insufficient documentation

## 2013-03-29 LAB — POCT I-STAT, CHEM 8
BUN: 12 mg/dL (ref 6–23)
Chloride: 109 mEq/L (ref 96–112)
Creatinine, Ser: 0.6 mg/dL (ref 0.50–1.10)
Glucose, Bld: 109 mg/dL — ABNORMAL HIGH (ref 70–99)
Hemoglobin: 13.9 g/dL (ref 12.0–15.0)
Potassium: 3.6 mEq/L (ref 3.5–5.1)

## 2013-03-29 LAB — CBC WITH DIFFERENTIAL/PLATELET
Basophils Relative: 0 % (ref 0–1)
HCT: 36.7 % (ref 36.0–46.0)
Hemoglobin: 13 g/dL (ref 12.0–15.0)
Lymphs Abs: 1.4 10*3/uL (ref 0.7–4.0)
MCHC: 35.4 g/dL (ref 30.0–36.0)
Monocytes Absolute: 0.2 10*3/uL (ref 0.1–1.0)
Monocytes Relative: 2 % — ABNORMAL LOW (ref 3–12)
Neutro Abs: 6.4 10*3/uL (ref 1.7–7.7)

## 2013-03-29 LAB — URINE MICROSCOPIC-ADD ON

## 2013-03-29 LAB — URINALYSIS, ROUTINE W REFLEX MICROSCOPIC
Bilirubin Urine: NEGATIVE
Glucose, UA: NEGATIVE mg/dL
Hgb urine dipstick: NEGATIVE
Specific Gravity, Urine: 1.029 (ref 1.005–1.030)
pH: 6.5 (ref 5.0–8.0)

## 2013-03-29 LAB — PREGNANCY, URINE: Preg Test, Ur: NEGATIVE

## 2013-03-29 MED ORDER — MORPHINE SULFATE 4 MG/ML IJ SOLN
4.0000 mg | Freq: Once | INTRAMUSCULAR | Status: AC
  Administered 2013-03-29: 4 mg via INTRAVENOUS
  Filled 2013-03-29: qty 1

## 2013-03-29 MED ORDER — ONDANSETRON HCL 4 MG/2ML IJ SOLN
4.0000 mg | Freq: Once | INTRAMUSCULAR | Status: AC
  Administered 2013-03-29: 4 mg via INTRAVENOUS
  Filled 2013-03-29: qty 2

## 2013-03-29 MED ORDER — SODIUM CHLORIDE 0.9 % IV BOLUS (SEPSIS)
2000.0000 mL | Freq: Once | INTRAVENOUS | Status: AC
  Administered 2013-03-29: 2000 mL via INTRAVENOUS

## 2013-03-29 MED ORDER — ONDANSETRON HCL 4 MG/2ML IJ SOLN: 4.0000 mg | Freq: Once | INTRAMUSCULAR | Status: DC

## 2013-03-29 MED ORDER — CEPHALEXIN 250 MG PO CAPS
500.0000 mg | ORAL_CAPSULE | Freq: Once | ORAL | Status: AC
  Administered 2013-03-30: 500 mg via ORAL
  Filled 2013-03-29: qty 2

## 2013-03-29 NOTE — ED Notes (Signed)
Tolerating ice chips without problems.

## 2013-03-29 NOTE — ED Notes (Signed)
Pt reports nausea and vomiting since this morning. Chills. Pt denies fever and diarrhea. Pt has not tried any otc meds for symptoms.

## 2013-03-29 NOTE — ED Notes (Signed)
Pt advised that urine sample needed. States she does not feel like she can go to the [facility] right now

## 2013-03-29 NOTE — ED Provider Notes (Signed)
History     CSN: [id number]  Arrival date & time 03/29/13  1945   First MD Initiated Contact with Patient 03/29/13 2008      Chief Complaint  Patient presents with  . Nausea  . Emesis    (Consider location/radiation/quality/duration/timing/severity/associated sxs/prior treatment) HPI Taylor Bartlett is a 27 y.o. female who presents to ED with complaint of nausea, vomiting, diarrhea onset this morning. States started throwing up this morning, not improving. Stats now diffuse abdominal pain. Pain is "crampy" constant, worsened with vomiting. No fever. No urinary symptoms. No vaginal discharge or bleeding. Denies pregnancy. Has not tried any medications for this. Went to [facility] sent here. Denies drugs or alcohol recently. States hx of both.   Past Medical History  Diagnosis Date  . Seasonal allergies   . Alcohol abuse   . Hypertension   . Reflux   . Seizures   . Anxiety     History reviewed. No pertinent past surgical history.  No family history on file.  History  Substance Use Topics  . Smoking status: Current Every Day Smoker    Types: Cigarettes  . Smokeless tobacco: Not on file  . Alcohol Use: Yes     Comment: clean 07/31/12    OB History   Grav Para Term Preterm Abortions TAB SAB Ect Mult Living                  Review of Systems  Constitutional: Negative for fever and chills.  Respiratory: Negative.   Cardiovascular: Negative.   Gastrointestinal: Positive for nausea, vomiting, abdominal pain and diarrhea. Negative for constipation and blood in stool.  Genitourinary: Negative for urgency, frequency, flank pain, vaginal bleeding, vaginal discharge and vaginal pain.  Neurological: Negative for seizures and weakness.  All other systems reviewed and are negative.    Allergies  Tomato  Home Medications  No current outpatient prescriptions on file.  BP 164/81  Pulse 58  Temp(Src) 97.7 F (36.5 C) (Oral)  Ht 5\' 2"  (1.575 m)  Wt 115 lb (52.164 kg)  BMI  21.03 kg/m2  SpO2 100%  LMP 02/27/2013  Physical Exam  Nursing note and vitals reviewed. Constitutional: She appears well-developed and well-nourished. No distress.  Eyes: Conjunctivae are normal.  Neck: Neck supple.  Cardiovascular: Normal rate, regular rhythm and normal heart sounds.   Pulmonary/Chest: Effort normal and breath sounds normal. No respiratory distress. She has no wheezes. She has no rales.  Abdominal: Soft. Bowel sounds are normal. She exhibits no distension. There is tenderness. There is no rebound and no guarding.  Diffuse tenderness over abdomen. No cva tenderness  Neurological: She is alert.  Skin: Skin is warm and dry.    ED Course  Procedures (including critical care time)  Pt with hx of alcohol abuse, here with nausea, vomiting, diarrhea. Diffuse abdominal pain. Labs pending. Will add LFTs and UA, not ordered up front. Fluids ordered. zofran and mrophine for pain. VS normal at this time other than slight htn at 151/88.   Results for orders placed during the hospital encounter of 03/29/13  CBC WITH DIFFERENTIAL      Result Value Range   WBC 7.9  4.0 - 10.5 K/uL   RBC 3.97  3.87 - 5.11 MIL/uL   Hemoglobin 13.0  12.0 - 15.0 g/dL   HCT 40.9  81.1 - 91.4 %   MCV 92.4  78.0 - 100.0 fL   MCH 32.7  26.0 - 34.0 pg   MCHC 35.4  30.0 -  36.0 g/dL   RDW 40.9  81.1 - 91.4 %   Platelets 168  150 - 400 K/uL   Neutrophils Relative 81 (*) 43 - 77 %   Neutro Abs 6.4  1.7 - 7.7 K/uL   Lymphocytes Relative 17  12 - 46 %   Lymphs Abs 1.4  0.7 - 4.0 K/uL   Monocytes Relative 2 (*) 3 - 12 %   Monocytes Absolute 0.2  0.1 - 1.0 K/uL   Eosinophils Relative 0  0 - 5 %   Eosinophils Absolute 0.0  0.0 - 0.7 K/uL   Basophils Relative 0  0 - 1 %   Basophils Absolute 0.0  0.0 - 0.1 K/uL  LIPASE, BLOOD      Result Value Range   Lipase 13  11 - 59 U/L  URINALYSIS, ROUTINE W REFLEX MICROSCOPIC      Result Value Range   Color, Urine YELLOW  YELLOW   APPearance CLOUDY (*) CLEAR    Specific Gravity, Urine 1.029  1.005 - 1.030   pH 6.5  5.0 - 8.0   Glucose, UA NEGATIVE  NEGATIVE mg/dL   Hgb urine dipstick NEGATIVE  NEGATIVE   Bilirubin Urine NEGATIVE  NEGATIVE   Ketones, ur >80 (*) NEGATIVE mg/dL   Protein, ur NEGATIVE  NEGATIVE mg/dL   Urobilinogen, UA 0.2  0.0 - 1.0 mg/dL   Nitrite NEGATIVE  NEGATIVE   Leukocytes, UA MODERATE (*) NEGATIVE  PREGNANCY, URINE      Result Value Range   Preg Test, Ur NEGATIVE  NEGATIVE  HEPATIC FUNCTION PANEL      Result Value Range   Total Protein 6.8  6.0 - 8.3 g/dL   Albumin 3.8  3.5 - 5.2 g/dL   AST 22  0 - 37 U/L   ALT 12  0 - 35 U/L   Alkaline Phosphatase 53  39 - 117 U/L   Total Bilirubin 0.3  0.3 - 1.2 mg/dL   Bilirubin, Direct <7.8  0.0 - 0.3 mg/dL   Indirect Bilirubin NOT CALCULATED  0.3 - 0.9 mg/dL  URINE MICROSCOPIC-ADD ON      Result Value Range   Squamous Epithelial / LPF RARE  RARE   WBC, UA 7-10  <3 WBC/hpf   RBC / HPF 0-2  <3 RBC/hpf   Bacteria, UA FEW (*) RARE   Urine-Other MUCOUS PRESENT    POCT I-STAT, CHEM 8      Result Value Range   Sodium 141  135 - 145 mEq/L   Potassium 3.6  3.5 - 5.1 mEq/L   Chloride 109  96 - 112 mEq/L   BUN 12  6 - 23 mg/dL   Creatinine, Ser 2.95  0.50 - 1.10 mg/dL   Glucose, Bld 621 (*) 70 - 99 mg/dL   Calcium, Ion 3.08  6.57 - 1.23 mmol/L   TCO2 19  0 - 100 mmol/L   Hemoglobin 13.9  12.0 - 15.0 g/dL   HCT 84.6  96.2 - 95.2 %   No results found.   1. Nausea vomiting and diarrhea   2. UTI (lower urinary tract infection)       MDM  Pt with n/v/d, diffuse abdominal pain. Pain resolved with 4mg  morphine IV. Her nausea improved with zofran. She feels much better. Pt given 2L of NS. Her labs did indicate dehydration with >80 ketones. Her CBC and LFTs normal. Her ua shows infection. Pt treated with keflex 500mg  PO in ED. She will be discharged home with  keflex and phenergan for nausea. Her abdomen reassessed and is non tender. She is tolerating PO fluids in ED. She has no  complaints. Suspect most likely viral gastroenteritis. Plan to d/c home with follow up. Return precautions given.    Filed Vitals:   03/29/13 1950 03/29/13 2100  BP: 151/88 164/81  Pulse: 63 58  Temp: 97.7 F (36.5 C)   TempSrc: Oral   Height: 5\' 2"  (1.575 m)   Weight: 115 lb (52.164 kg)   SpO2: 100% 100%        Lottie Mussel, PA-C 03/30/13 0459

## 2013-03-29 NOTE — ED Notes (Signed)
Pt reporting feeling better and  hungry. Per Osie Bond, PA pt can have a few ice chips. Pt given a few ice chips.

## 2013-03-29 NOTE — ED Notes (Addendum)
Patient with nausea and vomiting with diarrhea since this morning.  Patient seen at [facility].  Patient states she feels like she is going to pass out.  Patient actively vomiting in triage.  Patient states that she has been unable to keep food down and vomiting yellow emesis.

## 2013-03-29 NOTE — ED Provider Notes (Signed)
History     CSN: [id number]  Arrival date & time 03/29/13  1816   First MD Initiated Contact with Patient 03/29/13 1930      Chief Complaint  Patient presents with  . Emesis    nausea and vomiting since this morning.     (Consider location/radiation/quality/duration/timing/severity/associated sxs/prior treatment) Patient is a 27 y.o. female presenting with vomiting. The history is provided by the patient.  Emesis Severity:  Moderate Duration:  10 hours Timing:  Constant Quality:  Bilious material Progression:  Unchanged Chronicity:  New Associated symptoms: abdominal pain, chills and diarrhea     Past Medical History  Diagnosis Date  . Seasonal allergies   . Alcohol abuse   . Hypertension   . Reflux   . Seizures   . Anxiety     History reviewed. No pertinent past surgical history.  History reviewed. No pertinent family history.  History  Substance Use Topics  . Smoking status: Current Every Day Smoker    Types: Cigarettes  . Smokeless tobacco: Not on file  . Alcohol Use: Yes     Comment: clean 07/31/12    OB History   Grav Para Term Preterm Abortions TAB SAB Ect Mult Living                  Review of Systems  Constitutional: Positive for chills.  Gastrointestinal: Positive for nausea, vomiting, abdominal pain and diarrhea. Negative for blood in stool.  Skin: Negative.     Allergies  Tomato  Home Medications   Current Outpatient Rx  Name  Route  Sig  Dispense  Refill  . diphenhydrAMINE (SOMINEX) 25 MG tablet   Oral   Take 25 mg by mouth every 6 (six) hours.         Marland Kitchen FLUoxetine (PROZAC) 20 MG tablet   Oral   Take 20 mg by mouth daily.         Marland Kitchen guaiFENesin (MUCINEX) 600 MG 12 hr tablet   Oral   Take 1,200 mg by mouth 2 (two) times daily.         . hydrochlorothiazide (HYDRODIURIL) 25 MG tablet   Oral   Take 0.5 tablets (12.5 mg total) by mouth daily.   30 tablet   1   . omeprazole (PRILOSEC) 20 MG capsule   Oral   Take 20 mg  by mouth daily.         . phenazopyridine (PYRIDIUM) 200 MG tablet   Oral   Take 1 tablet (200 mg total) by mouth 3 (three) times daily as needed for pain.   6 tablet   0   . sulfamethoxazole-trimethoprim (SEPTRA DS) 800-160 MG per tablet   Oral   Take 1 tablet by mouth every 12 (twelve) hours.   10 tablet   0   . traZODone (DESYREL) 100 MG tablet   Oral   Take 100 mg by mouth at bedtime.           BP 169/91  Pulse 55  Temp(Src) 97.4 F (36.3 C) (Oral)  Resp 16  SpO2 100%  LMP 02/27/2013  Physical Exam  Nursing note and vitals reviewed. Constitutional: She is oriented to person, place, and time. She appears well-developed and well-nourished.  HENT:  Head: Normocephalic.  Right Ear: External ear normal.  Left Ear: External ear normal.  Mouth/Throat: Oropharynx is clear and moist.  Abdominal: Soft. Bowel sounds are normal. She exhibits no distension and no mass. There is no hepatosplenomegaly. There is tenderness  in the epigastric area. There is no rebound, no guarding and no CVA tenderness.  Neurological: She is alert and oriented to person, place, and time.  Skin: Skin is warm and dry.    ED Course  Procedures (including critical care time)  Labs Reviewed - No data to display No results found.   1. Gastroenteritis due to norovirus       MDM  Sent for ivf and meds, protracted n/v/d Today.        Linna Hoff, MD 03/29/13 [phone]

## 2013-03-30 LAB — HEPATIC FUNCTION PANEL
ALT: 12 U/L (ref 0–35)
AST: 22 U/L (ref 0–37)
Albumin: 3.8 g/dL (ref 3.5–5.2)
Bilirubin, Direct: <0.1 mg/dL (ref 0.0–0.3)
Total Bilirubin: 0.3 mg/dL (ref 0.3–1.2)

## 2013-03-30 MED ORDER — CEPHALEXIN 500 MG PO CAPS: 500.0000 mg | ORAL_CAPSULE | Freq: Two times a day (BID) | ORAL | Status: DC

## 2013-03-30 MED ORDER — PROMETHAZINE HCL 25 MG PO TABS: 25.0000 mg | ORAL_TABLET | Freq: Four times a day (QID) | ORAL | Status: DC | PRN

## 2013-03-30 NOTE — ED Provider Notes (Signed)
Medical screening examination/treatment/procedure(s) were performed by non-physician practitioner and as supervising physician I was immediately available for consultation/collaboration.   Mina Babula, MD 03/30/13 1729 

## 2013-03-31 LAB — URINE CULTURE

## 2013-05-09 ENCOUNTER — Encounter (HOSPITAL_COMMUNITY): Payer: Self-pay | Admitting: Emergency Medicine

## 2013-05-09 ENCOUNTER — Emergency Department (HOSPITAL_COMMUNITY)
Admission: EM | Admit: 2013-05-09 | Discharge: 2013-05-09 | Disposition: A | Discharge status: Home / Self Care | Payer: 59 | Source: Home / Self Care | Attending: Emergency Medicine | Admitting: Emergency Medicine

## 2013-05-09 ENCOUNTER — Emergency Department (HOSPITAL_COMMUNITY)
Admission: EM | Admit: 2013-05-09 | Discharge: 2013-05-09 | Disposition: A | Discharge status: Home / Self Care | Payer: 59 | Attending: Emergency Medicine | Admitting: Emergency Medicine

## 2013-05-09 DIAGNOSIS — R109 Unspecified abdominal pain: Secondary | ICD-10-CM

## 2013-05-09 DIAGNOSIS — I10 Essential (primary) hypertension: Secondary | ICD-10-CM | POA: Insufficient documentation

## 2013-05-09 DIAGNOSIS — K219 Gastro-esophageal reflux disease without esophagitis: Secondary | ICD-10-CM | POA: Insufficient documentation

## 2013-05-09 DIAGNOSIS — F411 Generalized anxiety disorder: Secondary | ICD-10-CM | POA: Insufficient documentation

## 2013-05-09 DIAGNOSIS — K5289 Other specified noninfective gastroenteritis and colitis: Secondary | ICD-10-CM | POA: Insufficient documentation

## 2013-05-09 DIAGNOSIS — F17200 Nicotine dependence, unspecified, uncomplicated: Secondary | ICD-10-CM | POA: Insufficient documentation

## 2013-05-09 DIAGNOSIS — G40909 Epilepsy, unspecified, not intractable, without status epilepticus: Secondary | ICD-10-CM | POA: Insufficient documentation

## 2013-05-09 DIAGNOSIS — E860 Dehydration: Secondary | ICD-10-CM | POA: Insufficient documentation

## 2013-05-09 DIAGNOSIS — R197 Diarrhea, unspecified: Secondary | ICD-10-CM | POA: Insufficient documentation

## 2013-05-09 DIAGNOSIS — R112 Nausea with vomiting, unspecified: Secondary | ICD-10-CM

## 2013-05-09 DIAGNOSIS — F1010 Alcohol abuse, uncomplicated: Secondary | ICD-10-CM | POA: Insufficient documentation

## 2013-05-09 DIAGNOSIS — K529 Noninfective gastroenteritis and colitis, unspecified: Secondary | ICD-10-CM

## 2013-05-09 LAB — POCT URINALYSIS DIP (DEVICE)
Bilirubin Urine: NEGATIVE
Ketones, ur: NEGATIVE mg/dL
Leukocytes, UA: NEGATIVE
Protein, ur: 30 mg/dL — AB
pH: 6.5 (ref 5.0–8.0)

## 2013-05-09 MED ORDER — SODIUM CHLORIDE 0.9 % IV BOLUS (SEPSIS)
1000.0000 mL | Freq: Once | INTRAVENOUS | Status: AC
  Administered 2013-05-09: 1000 mL via INTRAVENOUS

## 2013-05-09 MED ORDER — FENTANYL CITRATE 0.05 MG/ML IJ SOLN
50.0000 ug | Freq: Once | INTRAMUSCULAR | Status: AC
  Administered 2013-05-09: 50 ug via INTRAVENOUS
  Filled 2013-05-09: qty 2

## 2013-05-09 MED ORDER — ONDANSETRON HCL 4 MG/2ML IJ SOLN
4.0000 mg | Freq: Once | INTRAMUSCULAR | Status: AC
  Administered 2013-05-09: 4 mg via INTRAVENOUS
  Filled 2013-05-09: qty 2

## 2013-05-09 MED ORDER — ONDANSETRON HCL 4 MG/2ML IJ SOLN
4.0000 mg | Freq: Once | INTRAMUSCULAR | Status: AC
  Administered 2013-05-09: 4 mg via INTRAMUSCULAR

## 2013-05-09 MED ORDER — ONDANSETRON HCL 4 MG/2ML IJ SOLN
INTRAMUSCULAR | Status: AC
  Filled 2013-05-09: qty 2

## 2013-05-09 MED ORDER — PROMETHAZINE HCL 25 MG PO TABS: 25.0000 mg | ORAL_TABLET | Freq: Four times a day (QID) | ORAL | Status: DC | PRN

## 2013-05-09 NOTE — ED Provider Notes (Signed)
History     CSN: [id number]  Arrival date & time 05/09/13  1018   First MD Initiated Contact with Patient 05/09/13 1036      Chief Complaint  Patient presents with  . Abdominal Pain    (Consider location/radiation/quality/duration/timing/severity/associated sxs/prior treatment) HPI Comments: Patient presents urgent care complaining of severe abdominal pain nausea vomiting and diarrhea. describes that she has vomited about 5-7  times since this early morning and have had about the same amount of liquidy diarrhea is denies any blood in her stools. Pain is worse ( points to epigastric and bilateral upper abdomen). She is unable to drink any fluids. When asked she describes her last drink was 3 days ago. She described herself as a social drinker t weeks without drinking. Does admit that she smokes Marijuana Patient denies any fevers, recent international trips.  Patient describes that last night she ate a hamburger and suspect that she might have gotten food poisoning      Patient is a 27 y.o. female presenting with abdominal pain. The history is provided by the patient.  Abdominal Pain This is a recurrent problem. The current episode started 6 to 12 hours ago. The problem occurs constantly. The problem has not changed since onset.Associated symptoms include abdominal pain. Pertinent negatives include no headaches and no shortness of breath. Nothing relieves the symptoms. The treatment provided no relief.    Past Medical History  Diagnosis Date  . Seasonal allergies   . Alcohol abuse   . Hypertension   . Reflux   . Seizures   . Anxiety     History reviewed. No pertinent past surgical history.  History reviewed. No pertinent family history.  History  Substance Use Topics  . Smoking status: Current Every Day Smoker    Types: Cigarettes  . Smokeless tobacco: Not on file  . Alcohol Use: Yes     Comment: clean 07/31/12    OB History   Grav Para Term Preterm Abortions TAB SAB  Ect Mult Living                  Review of Systems  Constitutional: Positive for appetite change. Negative for fever, diaphoresis and fatigue.  Respiratory: Negative for cough and shortness of breath.   Gastrointestinal: Positive for nausea, vomiting, abdominal pain and diarrhea. Negative for abdominal distention and rectal pain.  Endocrine: Negative for polydipsia, polyphagia and polyuria.  Genitourinary: Negative for dysuria, flank pain and pelvic pain.  Skin: Negative for color change and pallor.  Neurological: Negative for headaches.    Allergies  Tomato  Home Medications   Current Outpatient Rx  Name  Route  Sig  Dispense  Refill  . cephALEXin (KEFLEX) 500 MG capsule   Oral   Take 1 capsule (500 mg total) by mouth 2 (two) times daily.   14 capsule   0   . promethazine (PHENERGAN) 25 MG tablet   Oral   Take 1 tablet (25 mg total) by mouth every 6 (six) hours as needed for nausea.   20 tablet   0     BP 161/126  Pulse 73  Temp(Src) 97.9 F (36.6 C) (Oral)  Resp 20  SpO2 100%  LMP 05/09/2013  Physical Exam  Nursing note and vitals reviewed. Constitutional: She has a sickly appearance. She appears distressed.  HENT:  Mouth/Throat: No oropharyngeal exudate.  Eyes: Scleral icterus is present.  Neck: Neck supple.  Cardiovascular: Normal rate.  Exam reveals no gallop and no friction rub.  Abdominal: Normal appearance and bowel sounds are normal. She exhibits no mass. There is no hepatosplenomegaly, splenomegaly or hepatomegaly. There is tenderness in the right upper quadrant, right lower quadrant and epigastric area. There is guarding and positive Murphy's sign. There is no rigidity, no rebound, no CVA tenderness and no tenderness at McBurney's point. No hernia. Hernia confirmed negative in the ventral area.    Neurological: She is alert.  Skin: No erythema.    ED Course  Procedures (including critical care time)  Labs Reviewed  POCT URINALYSIS DIP  (DEVICE) - Abnormal; Notable for the following:    Hgb urine dipstick MODERATE (*)    Protein, ur 30 (*)    All other components within normal limits  URINALYSIS, DIPSTICK ONLY  POCT PREGNANCY, URINE   No results found.   1. Abdominal pain   2. Nausea, vomiting, and diarrhea       MDM  Patient with sudden onset of gastrointestinal symptoms. Not tolerating oral fluids. Exam somewhat concerning to her right upper abdomen. We'll transfer patient to the emergency department for IV support symptom management and concentration home on differential diagnoses would include pancreatitis.       Jimmie Molly, MD 05/09/13 [phone]

## 2013-05-09 NOTE — ED Provider Notes (Signed)
History     CSN: [id number]  Arrival date & time 05/09/13  1137   First MD Initiated Contact with Patient 05/09/13 1149      Chief Complaint  Patient presents with  . Nausea  . Diarrhea    (Consider location/radiation/quality/duration/timing/severity/associated sxs/prior treatment) HPI..... nausea, vomiting, diarrhea since 0500 today.  Patient reports approximately 5 episodes of each.  Nothing makes symptoms better or worse. Severity is mild to moderate. No blood or mucus in stool. She feels slightly dehydrated. No abdominal pain, fever, chills, dysuria  Past Medical History  Diagnosis Date  . Seasonal allergies   . Alcohol abuse   . Hypertension   . Reflux   . Seizures   . Anxiety     History reviewed. No pertinent past surgical history.  No family history on file.  History  Substance Use Topics  . Smoking status: Current Every Day Smoker    Types: Cigarettes  . Smokeless tobacco: Not on file  . Alcohol Use: Yes     Comment: clean 07/31/12    OB History   Grav Para Term Preterm Abortions TAB SAB Ect Mult Living                  Review of Systems  All other systems reviewed and are negative.    Allergies  Tomato  Home Medications   Current Outpatient Rx  Name  Route  Sig  Dispense  Refill  . promethazine (PHENERGAN) 25 MG tablet   Oral   Take 1 tablet (25 mg total) by mouth every 6 (six) hours as needed for nausea.   15 tablet   0     BP 119/67  Pulse 54  Temp(Src) 97.7 F (36.5 C) (Oral)  Resp 18  SpO2 100%  LMP 05/06/2013  Physical Exam  Nursing note and vitals reviewed. Constitutional: She is oriented to person, place, and time.  Slightly dehydrated  HENT:  Head: Normocephalic and atraumatic.  Eyes: Conjunctivae and EOM are normal. Pupils are equal, round, and reactive to light.  Neck: Normal range of motion. Neck supple.  Cardiovascular: Normal rate, regular rhythm and normal heart sounds.   Pulmonary/Chest: Effort normal and  breath sounds normal.  Abdominal: Soft. Bowel sounds are normal.  Musculoskeletal: Normal range of motion.  Neurological: She is alert and oriented to person, place, and time.  Skin: Skin is warm and dry.  Psychiatric: She has a normal mood and affect.    ED Course  Procedures (including critical care time)  Labs Reviewed - No data to display No results found.   1. Gastroenteritis       MDM  Patient feels much better after IV fluids and Zofran. No acute abdomen. Discharge meds Phenergan 25 mg #15        Donnetta Hutching, MD 05/09/13 1657

## 2013-05-09 NOTE — ED Notes (Addendum)
Pt c/o abd pain onset 0500 today... Reports the pain woke her up and since then she has not stopped vomiting Also c/o diarrhea, chills... Reports she was seen here about a month ago and sent to [facility] ED for similar sxs... Dx also w/UTI She believes it might have been caused by [employer] meat that she ate last night. No PCP... BP today is 161/126... Does not remember the name of her BP meds.  She is alert and oriented w/no signs of acute distress.

## 2013-05-09 NOTE — ED Notes (Signed)
Pt. Stated, I'm having Nausea vomiting and diarrhea since 0500 this morning

## 2013-05-09 NOTE — ED Notes (Signed)
Pt has vomited x4 in the exam room.

## 2013-05-09 NOTE — ED Notes (Signed)
MD at bedside. 

## 2013-05-15 ENCOUNTER — Emergency Department (INDEPENDENT_AMBULATORY_CARE_PROVIDER_SITE_OTHER)
Admission: EM | Admit: 2013-05-15 | Discharge: 2013-05-15 | Disposition: A | Discharge status: Nursing Home (Includes ICF) | Payer: 59 | Source: Home / Self Care

## 2013-05-15 ENCOUNTER — Emergency Department (HOSPITAL_COMMUNITY)
Admission: EM | Admit: 2013-05-15 | Discharge: 2013-05-16 | Disposition: A | Discharge status: Home / Self Care | Payer: 59 | Attending: Emergency Medicine | Admitting: Emergency Medicine

## 2013-05-15 ENCOUNTER — Encounter (HOSPITAL_COMMUNITY): Payer: Self-pay | Admitting: Emergency Medicine

## 2013-05-15 ENCOUNTER — Encounter (HOSPITAL_COMMUNITY): Payer: Self-pay | Admitting: *Deleted

## 2013-05-15 DIAGNOSIS — R1084 Generalized abdominal pain: Secondary | ICD-10-CM

## 2013-05-15 DIAGNOSIS — I10 Essential (primary) hypertension: Secondary | ICD-10-CM | POA: Insufficient documentation

## 2013-05-15 DIAGNOSIS — R197 Diarrhea, unspecified: Secondary | ICD-10-CM | POA: Insufficient documentation

## 2013-05-15 DIAGNOSIS — F17200 Nicotine dependence, unspecified, uncomplicated: Secondary | ICD-10-CM | POA: Insufficient documentation

## 2013-05-15 DIAGNOSIS — R6883 Chills (without fever): Secondary | ICD-10-CM | POA: Insufficient documentation

## 2013-05-15 DIAGNOSIS — Z8669 Personal history of other diseases of the nervous system and sense organs: Secondary | ICD-10-CM | POA: Insufficient documentation

## 2013-05-15 DIAGNOSIS — R112 Nausea with vomiting, unspecified: Secondary | ICD-10-CM | POA: Insufficient documentation

## 2013-05-15 DIAGNOSIS — R52 Pain, unspecified: Secondary | ICD-10-CM

## 2013-05-15 DIAGNOSIS — Z8659 Personal history of other mental and behavioral disorders: Secondary | ICD-10-CM | POA: Insufficient documentation

## 2013-05-15 DIAGNOSIS — Z3202 Encounter for pregnancy test, result negative: Secondary | ICD-10-CM | POA: Insufficient documentation

## 2013-05-15 DIAGNOSIS — E860 Dehydration: Secondary | ICD-10-CM

## 2013-05-15 DIAGNOSIS — F1010 Alcohol abuse, uncomplicated: Secondary | ICD-10-CM | POA: Insufficient documentation

## 2013-05-15 DIAGNOSIS — R109 Unspecified abdominal pain: Secondary | ICD-10-CM

## 2013-05-15 DIAGNOSIS — R1110 Vomiting, unspecified: Secondary | ICD-10-CM

## 2013-05-15 DIAGNOSIS — Z8719 Personal history of other diseases of the digestive system: Secondary | ICD-10-CM | POA: Insufficient documentation

## 2013-05-15 DIAGNOSIS — Z8709 Personal history of other diseases of the respiratory system: Secondary | ICD-10-CM | POA: Insufficient documentation

## 2013-05-15 LAB — CBC WITH DIFFERENTIAL/PLATELET
Basophils Relative: 0 % (ref 0–1)
Eosinophils Absolute: 0 10*3/uL (ref 0.0–0.7)
Eosinophils Relative: 0 % (ref 0–5)
HCT: 39.6 % (ref 36.0–46.0)
Hemoglobin: 13.7 g/dL (ref 12.0–15.0)
Lymphs Abs: 1.7 10*3/uL (ref 0.7–4.0)
MCH: 32.9 pg (ref 26.0–34.0)
MCHC: 34.6 g/dL (ref 30.0–36.0)
MCV: 95 fL (ref 78.0–100.0)
Monocytes Absolute: 0.3 10*3/uL (ref 0.1–1.0)
Monocytes Relative: 3 % (ref 3–12)
Neutrophils Relative %: 80 % — ABNORMAL HIGH (ref 43–77)
RBC: 4.17 MIL/uL (ref 3.87–5.11)

## 2013-05-15 LAB — COMPREHENSIVE METABOLIC PANEL
Albumin: 4.8 g/dL (ref 3.5–5.2)
Alkaline Phosphatase: 75 U/L (ref 39–117)
BUN: 8 mg/dL (ref 6–23)
Creatinine, Ser: 0.71 mg/dL (ref 0.50–1.10)
GFR calc Af Amer: >90 mL/min (ref >90)
Glucose, Bld: 129 mg/dL — ABNORMAL HIGH (ref 70–99)
Potassium: 4.2 mEq/L (ref 3.5–5.1)
Total Protein: 8.4 g/dL — ABNORMAL HIGH (ref 6.0–8.3)

## 2013-05-15 LAB — URINALYSIS, ROUTINE W REFLEX MICROSCOPIC
Bilirubin Urine: NEGATIVE
Ketones, ur: 40 mg/dL — AB
Nitrite: NEGATIVE
Specific Gravity, Urine: 1.022 (ref 1.005–1.030)
Urobilinogen, UA: 1 mg/dL (ref 0.0–1.0)

## 2013-05-15 LAB — URINE MICROSCOPIC-ADD ON

## 2013-05-15 LAB — LIPASE, BLOOD: Lipase: 25 U/L (ref 11–59)

## 2013-05-15 MED ORDER — GI COCKTAIL ~~LOC~~
30.0000 mL | Freq: Once | ORAL | Status: AC
  Administered 2013-05-15: 30 mL via ORAL
  Filled 2013-05-15: qty 30

## 2013-05-15 MED ORDER — ONDANSETRON HCL 4 MG/2ML IJ SOLN
4.0000 mg | Freq: Once | INTRAMUSCULAR | Status: AC
  Administered 2013-05-15: 4 mg via INTRAVENOUS
  Filled 2013-05-15: qty 2

## 2013-05-15 MED ORDER — METOCLOPRAMIDE HCL 10 MG PO TABS: 10.0000 mg | ORAL_TABLET | Freq: Four times a day (QID) | ORAL | Status: DC

## 2013-05-15 MED ORDER — MORPHINE SULFATE 4 MG/ML IJ SOLN
4.0000 mg | Freq: Once | INTRAMUSCULAR | Status: AC
  Administered 2013-05-15: 4 mg via INTRAVENOUS
  Filled 2013-05-15: qty 1

## 2013-05-15 MED ORDER — SODIUM CHLORIDE 0.9 % IV BOLUS (SEPSIS)
2000.0000 mL | Freq: Once | INTRAVENOUS | Status: AC
  Administered 2013-05-15: 2000 mL via INTRAVENOUS

## 2013-05-15 MED ORDER — METOCLOPRAMIDE HCL 5 MG/ML IJ SOLN
10.0000 mg | Freq: Once | INTRAMUSCULAR | Status: AC
  Administered 2013-05-15: 10 mg via INTRAVENOUS
  Filled 2013-05-15: qty 2

## 2013-05-15 MED ORDER — ONDANSETRON 4 MG PO TBDP
ORAL_TABLET | ORAL | Status: AC
  Filled 2013-05-15: qty 2

## 2013-05-15 MED ORDER — ONDANSETRON 4 MG PO TBDP
8.0000 mg | ORAL_TABLET | Freq: Once | ORAL | Status: AC
  Administered 2013-05-15: 8 mg via ORAL

## 2013-05-15 NOTE — ED Notes (Signed)
Pt sent from urgent care with lower abdominal pain with vomiting and diarrhea today and has had this before and did not follow-up

## 2013-05-15 NOTE — ED Provider Notes (Signed)
   History    CSN: [id number] Arrival date & time 05/15/13  1624  First MD Initiated Contact with Patient 05/15/13 1701     Chief Complaint  Patient presents with  . Emesis   (Consider location/radiation/quality/duration/timing/severity/associated sxs/prior Treatment) HPI Comments: 27 year old female presents with  moderate to severe generalized abdominal pain that began this morning. She is persistently vomiting during the exam his facies had nonstop vomiting all day today as well as diarrhea. She states that at least 12 or more stools and episodes of vomiting today. She has not taken any medication for this. She does have Phenergan at home and she never filled her prescription for Phenergan given to her by the emergency department on June 18.she states she has had emesis with blood today. Has been diaphoretic feeling cold and hot.   Past Medical History  Diagnosis Date  . Seasonal allergies   . Alcohol abuse   . Hypertension   . Reflux   . Seizures   . Anxiety    History reviewed. No pertinent past surgical history. No family history on file. History  Substance Use Topics  . Smoking status: Current Every Day Smoker    Types: Cigarettes  . Smokeless tobacco: Not on file  . Alcohol Use: Yes     Comment: clean 07/31/12   OB History   Grav Para Term Preterm Abortions TAB SAB Ect Mult Living                 Review of Systems  Constitutional: Positive for diaphoresis, activity change and fatigue. Negative for fever.  HENT: Negative.   Respiratory: Negative.   Gastrointestinal: Positive for nausea, vomiting, abdominal pain and diarrhea. Negative for blood in stool.  Genitourinary: Negative.   Skin: Negative.   Neurological: Positive for dizziness and weakness.    Allergies  Tomato  Home Medications   Current Outpatient Rx  Name  Route  Sig  Dispense  Refill  . promethazine (PHENERGAN) 25 MG tablet   Oral   Take 1 tablet (25 mg total) by mouth every 6 (six) hours as  needed for nausea.   15 tablet   0    BP 161/109  Pulse 64  Temp(Src) 98 F (36.7 C) (Oral)  Resp 18  SpO2 100%  LMP 05/06/2013 Physical Exam  Nursing note and vitals reviewed. Constitutional: She is oriented to person, place, and time.  Appears moderately ill. She is vomiting during most of the H&P. Diaphoretic.  Neck: Normal range of motion.  Cardiovascular: Normal rate and regular rhythm.   Pulmonary/Chest: Effort normal. No respiratory distress.  Abdominal: She exhibits no distension. There is tenderness. There is guarding.  Neurological: She is alert and oriented to person, place, and time.  Skin:  Diaphoretic    ED Course  Procedures (including critical care time) Labs Reviewed - No data to display No results found. 1. Abdominal pain, acute, generalized   2. Vomiting   3. Diarrhea   4. Dehydration     MDM  Transfer to the ED via shuttle for persistent, unrelenting moderate to severe abdominal pain associated with vomiting and diarrhea >12 times today.   It is noted she has had 2-3 similar episodes recently, referred to PCP and given information re getting an appointment ASAP. She has not followed instructions, never attempted to obtain an appointment.   Hayden Rasmussen, NP 05/15/13 1744  Hayden Rasmussen, NP 05/15/13 1745

## 2013-05-15 NOTE — ED Notes (Signed)
LMP Last week. LBM: today

## 2013-05-15 NOTE — ED Notes (Signed)
Seen June 18 for the same.  Patient reports this episode of vomiting started today and relates vomiting episodes with each vomiting episode.  Patient admits to drinking a beer yesterday.  Patient has not made calls to refferals as instructed.

## 2013-05-15 NOTE — ED Provider Notes (Signed)
History    CSN: [id number] Arrival date & time 05/15/13  1810  First MD Initiated Contact with Patient 05/15/13 2006     Chief Complaint  Patient presents with  . Abdominal Pain  . Diarrhea  . Emesis   (Consider location/radiation/quality/duration/timing/severity/associated sxs/prior Treatment) Patient is a 27 y.o. female presenting with abdominal pain. The history is provided by the patient.  Abdominal Pain This is a recurrent problem. The current episode started today. The problem occurs constantly. The problem has been unchanged. Associated symptoms include abdominal pain, chills, nausea and vomiting. Pertinent negatives include no chest pain, congestion, fever, numbness, rash or weakness. Nothing aggravates the symptoms. Treatments tried: phenergan at home.  The treatment provided no relief.   Past Medical History  Diagnosis Date  . Seasonal allergies   . Alcohol abuse   . Hypertension   . Reflux   . Seizures   . Anxiety    History reviewed. No pertinent past surgical history. No family history on file. History  Substance Use Topics  . Smoking status: Current Every Day Smoker    Types: Cigarettes  . Smokeless tobacco: Not on file  . Alcohol Use: Yes     Comment: clean 07/31/12   OB History   Grav Para Term Preterm Abortions TAB SAB Ect Mult Living                 Review of Systems  Constitutional: Positive for chills. Negative for fever.  HENT: Negative for congestion and rhinorrhea.   Respiratory: Negative for chest tightness and shortness of breath.   Cardiovascular: Negative for chest pain.  Gastrointestinal: Positive for nausea, vomiting, abdominal pain and diarrhea. Negative for blood in stool.  Genitourinary: Negative for dysuria.  Skin: Negative for rash.  Neurological: Negative for dizziness, weakness and numbness.  All other systems reviewed and are negative.    Allergies  Tomato  Home Medications   Current Outpatient Rx  Name  Route  Sig   Dispense  Refill  . promethazine (PHENERGAN) 25 MG tablet   Oral   Take 1 tablet (25 mg total) by mouth every 6 (six) hours as needed for nausea.   15 tablet   0    BP 165/105  Pulse 56  Temp(Src) 97.6 F (36.4 C) (Oral)  Resp 18  SpO2 100%  LMP 05/06/2013 Physical Exam  Nursing note and vitals reviewed. Constitutional: She is oriented to person, place, and time. She appears well-developed and well-nourished. No distress.  HENT:  Head: Normocephalic and atraumatic.  Mouth/Throat: Oropharynx is clear and moist.  Eyes: EOM are normal. Pupils are equal, round, and reactive to light.  Neck: Normal range of motion. Neck supple.  Cardiovascular: Normal rate, regular rhythm and normal heart sounds.  Exam reveals no friction rub.   No murmur heard. Pulmonary/Chest: Effort normal and breath sounds normal. No respiratory distress. She has no wheezes. She has no rales.  Abdominal: Soft. There is tenderness (mild diffuse TTP worse over LUQ. , abdomen soft, no periotneal signs.  ). There is no rebound and no guarding.  Musculoskeletal: Normal range of motion. She exhibits no edema and no tenderness.  Lymphadenopathy:    She has no cervical adenopathy.  Neurological: She is alert and oriented to person, place, and time.  Skin: Skin is warm and dry. No rash noted.  Psychiatric: She has a normal mood and affect. Her behavior is normal.    ED Course  Procedures (including critical care time) Labs Reviewed  CBC  WITH DIFFERENTIAL - Abnormal; Notable for the following:    Neutrophils Relative % 80 (*)    Neutro Abs 8.1 (*)    All other components within normal limits  COMPREHENSIVE METABOLIC PANEL - Abnormal; Notable for the following:    Glucose, Bld 129 (*)    Total Protein 8.4 (*)    All other components within normal limits  URINALYSIS, ROUTINE W REFLEX MICROSCOPIC - Abnormal; Notable for the following:    APPearance CLOUDY (*)    Ketones, ur 40 (*)    Protein, ur 30 (*)     Leukocytes, UA SMALL (*)    All other components within normal limits  LIPASE, BLOOD  URINE MICROSCOPIC-ADD ON  POCT PREGNANCY, URINE   No results found. 1. Abdominal pain   2. Nausea and vomiting     MDM  13:50 PM 27 year old female with a history of past alcohol abuse, hypertension anxiety presenting with abdominal pain, nausea, vomiting and diarrhea that started earlier today. Patient was seen in the ED on June 18 with similar symptoms and discharged with followup. She failed to call to establish with a primary physician due to the fact that she states she doesn't have time. She states for several years she has had intermittent episodes very similar to this that initially started when she stopped using alcohol. She appears uncomfortable on exam but vital signs are stable heart rate is around 60. Abdomen is soft. Labs unremarkable including negative lipase. There is not appear to be an acute abdominal process given this is something recurrent and chronic with a benign abdominal exam. We'll give fluids, Zofran, urine pregnancy test and GI cocktail.  11:52 PM labs show no significant abnormality. The patient received 2 rounds of zofran, fluids and GI cocktail with mild improvement and 2 doses of morphine. After Reglan she had much improvement in her symptoms. She appears to respond well to this. Will discharge with a prescription for Reglan. Had long discussion with patient. Emphasized importance of calling numbers provided to establish with primary physician. It was explained that the ED is not the best place for her to come for her recurrent, chronic abdominal pain and it is important for her to establish with a primary physician for further evaluation. She voiced understanding and was discharged in stable condition.  Caren Hazy, MD 05/15/13 2356

## 2013-05-16 NOTE — ED Notes (Signed)
Pt ambulating independently w/ steady gait on d/c in no acute distress, A&Ox4. D/c instructions reviewed w/ pt and family - pt and family deny any further questions or concerns at present. Rx given x1  

## 2013-05-17 NOTE — ED Provider Notes (Signed)
I saw and evaluated the patient, reviewed the resident's note and I agree with the findings and plan.  Chronic abdominal pain for years with minimal diffuse tenderness without rebound  Hurman Horn, MD 05/17/13 [phone]

## 2013-05-18 ENCOUNTER — Encounter (HOSPITAL_COMMUNITY): Payer: Self-pay | Admitting: Family Medicine

## 2013-05-18 ENCOUNTER — Emergency Department (HOSPITAL_COMMUNITY)
Admission: EM | Admit: 2013-05-18 | Discharge: 2013-05-18 | Disposition: A | Discharge status: Home / Self Care | Payer: 59 | Attending: Emergency Medicine | Admitting: Emergency Medicine

## 2013-05-18 DIAGNOSIS — Z8709 Personal history of other diseases of the respiratory system: Secondary | ICD-10-CM | POA: Insufficient documentation

## 2013-05-18 DIAGNOSIS — R197 Diarrhea, unspecified: Secondary | ICD-10-CM | POA: Insufficient documentation

## 2013-05-18 DIAGNOSIS — F1010 Alcohol abuse, uncomplicated: Secondary | ICD-10-CM | POA: Insufficient documentation

## 2013-05-18 DIAGNOSIS — F17200 Nicotine dependence, unspecified, uncomplicated: Secondary | ICD-10-CM | POA: Insufficient documentation

## 2013-05-18 DIAGNOSIS — Z8659 Personal history of other mental and behavioral disorders: Secondary | ICD-10-CM | POA: Insufficient documentation

## 2013-05-18 DIAGNOSIS — Z8719 Personal history of other diseases of the digestive system: Secondary | ICD-10-CM | POA: Insufficient documentation

## 2013-05-18 DIAGNOSIS — A599 Trichomoniasis, unspecified: Secondary | ICD-10-CM

## 2013-05-18 DIAGNOSIS — A5901 Trichomonal vulvovaginitis: Secondary | ICD-10-CM | POA: Insufficient documentation

## 2013-05-18 DIAGNOSIS — I10 Essential (primary) hypertension: Secondary | ICD-10-CM | POA: Insufficient documentation

## 2013-05-18 DIAGNOSIS — F1220 Cannabis dependence, uncomplicated: Secondary | ICD-10-CM

## 2013-05-18 DIAGNOSIS — R112 Nausea with vomiting, unspecified: Secondary | ICD-10-CM | POA: Insufficient documentation

## 2013-05-18 DIAGNOSIS — Z8669 Personal history of other diseases of the nervous system and sense organs: Secondary | ICD-10-CM | POA: Insufficient documentation

## 2013-05-18 DIAGNOSIS — Z3202 Encounter for pregnancy test, result negative: Secondary | ICD-10-CM | POA: Insufficient documentation

## 2013-05-18 LAB — CBC WITH DIFFERENTIAL/PLATELET
Basophils Absolute: 0 10*3/uL (ref 0.0–0.1)
Basophils Relative: 0 % (ref 0–1)
Eosinophils Absolute: 0 10*3/uL (ref 0.0–0.7)
Eosinophils Relative: 0 % (ref 0–5)
MCH: 33.6 pg (ref 26.0–34.0)
MCHC: 35.3 g/dL (ref 30.0–36.0)
MCV: 95.2 fL (ref 78.0–100.0)
Neutrophils Relative %: 73 % (ref 43–77)
Platelets: 192 10*3/uL (ref 150–400)
RBC: 4.4 MIL/uL (ref 3.87–5.11)
RDW: 13.2 % (ref 11.5–15.5)

## 2013-05-18 LAB — COMPREHENSIVE METABOLIC PANEL
ALT: 22 U/L (ref 0–35)
Albumin: 5.1 g/dL (ref 3.5–5.2)
Alkaline Phosphatase: 82 U/L (ref 39–117)
Calcium: 10.2 mg/dL (ref 8.4–10.5)
GFR calc Af Amer: >90 mL/min (ref >90)
Glucose, Bld: 108 mg/dL — ABNORMAL HIGH (ref 70–99)
Potassium: 3.4 mEq/L — ABNORMAL LOW (ref 3.5–5.1)
Sodium: 141 mEq/L (ref 135–145)
Total Protein: 8.9 g/dL — ABNORMAL HIGH (ref 6.0–8.3)

## 2013-05-18 LAB — URINALYSIS W MICROSCOPIC + REFLEX CULTURE
Glucose, UA: NEGATIVE mg/dL
Ketones, ur: NEGATIVE mg/dL
Nitrite: NEGATIVE
Specific Gravity, Urine: 1.022 (ref 1.005–1.030)
pH: 8 (ref 5.0–8.0)

## 2013-05-18 LAB — WET PREP, GENITAL: Yeast Wet Prep HPF POC: NONE SEEN

## 2013-05-18 MED ORDER — SODIUM CHLORIDE 0.9 % IV BOLUS (SEPSIS)
1000.0000 mL | Freq: Once | INTRAVENOUS | Status: AC
  Administered 2013-05-18: 1000 mL via INTRAVENOUS

## 2013-05-18 MED ORDER — METRONIDAZOLE 500 MG PO TABS
2000.0000 mg | ORAL_TABLET | Freq: Once | ORAL | Status: AC
  Administered 2013-05-18: 2000 mg via ORAL
  Filled 2013-05-18: qty 4

## 2013-05-18 MED ORDER — ONDANSETRON HCL 4 MG/2ML IJ SOLN
4.0000 mg | Freq: Once | INTRAMUSCULAR | Status: AC
  Administered 2013-05-18: 4 mg via INTRAVENOUS
  Filled 2013-05-18: qty 2

## 2013-05-18 MED ORDER — LORAZEPAM 2 MG/ML IJ SOLN
1.0000 mg | Freq: Once | INTRAMUSCULAR | Status: AC
  Administered 2013-05-18: 1 mg via INTRAVENOUS
  Filled 2013-05-18: qty 1

## 2013-05-18 MED ORDER — PROMETHAZINE HCL 25 MG PO TABS: 25.0000 mg | ORAL_TABLET | Freq: Four times a day (QID) | ORAL | Status: DC | PRN

## 2013-05-18 MED ORDER — MORPHINE SULFATE 4 MG/ML IJ SOLN
4.0000 mg | Freq: Once | INTRAMUSCULAR | Status: AC
  Administered 2013-05-18: 4 mg via INTRAVENOUS
  Filled 2013-05-18: qty 1

## 2013-05-18 MED ORDER — METOCLOPRAMIDE HCL 10 MG PO TABS: 10.0000 mg | ORAL_TABLET | Freq: Four times a day (QID) | ORAL | Status: DC

## 2013-05-18 NOTE — ED Notes (Signed)
tatiyana at bedside sts pt to take tylenol OTC per bottle instructions- written in discharge instructions

## 2013-05-18 NOTE — ED Notes (Signed)
Per pt having abdominal pain x 2 weeks. sts N,V,D. sts some blood in vomit.was recently here.

## 2013-05-18 NOTE — ED Provider Notes (Signed)
Medical screening examination/treatment/procedure(s) were performed by non-physician practitioner and as supervising physician I was immediately available for consultation/collaboration.   Glynn Octave, MD 05/18/13 2034

## 2013-05-18 NOTE — ED Notes (Signed)
Pt. Given bed pan- sts will attempt to void.

## 2013-05-18 NOTE — ED Provider Notes (Signed)
History    CSN: [id number] Arrival date & time 05/18/13  1019  First MD Initiated Contact with Patient 05/18/13 1106     Chief Complaint  Patient presents with  . Abdominal Pain   (Consider location/radiation/quality/duration/timing/severity/associated sxs/prior Treatment) HPI Taylor Bartlett is a 27 y.o. female who presents to ED with recurrent nausea, vomiting, abdominal pain. States crampy abdominal pain began this morning, stats woke her up from sleep, followed by persistent vomiting, and one episode of watery diarrhea. States saw blood in emesis. Did not take any medications. Nothing making pain better or worse. States this is 5th episode in the last several months.  Denies any medical problems. No recent travel. No one in household sick with the same. No prior abdominal surgeries. Denies pregnancy. NO vaginal or urinary complains.   Past Medical History  Diagnosis Date  . Seasonal allergies   . Alcohol abuse   . Hypertension   . Reflux   . Seizures   . Anxiety    History reviewed. No pertinent past surgical history. History reviewed. No pertinent family history. History  Substance Use Topics  . Smoking status: Current Every Day Smoker    Types: Cigarettes  . Smokeless tobacco: Not on file  . Alcohol Use: Yes     Comment: clean 07/31/12   OB History   Grav Para Term Preterm Abortions TAB SAB Ect Mult Living                 Review of Systems  Constitutional: Negative for fever and chills.  Gastrointestinal: Positive for nausea, vomiting, abdominal pain and diarrhea.  Genitourinary: Negative for dysuria, vaginal bleeding and vaginal discharge.  Neurological: Negative for dizziness, weakness and headaches.  All other systems reviewed and are negative.    Allergies  Tomato  Home Medications   Current Outpatient Rx  Name  Route  Sig  Dispense  Refill  . metoCLOPramide (REGLAN) 10 MG tablet   Oral   Take 1 tablet (10 mg total) by mouth every 6 (six)  hours.   30 tablet   0   . promethazine (PHENERGAN) 25 MG tablet   Oral   Take 1 tablet (25 mg total) by mouth every 6 (six) hours as needed for nausea.   15 tablet   0    BP 167/110  Pulse 65  Temp(Src) 97.9 F (36.6 C) (Oral)  Resp 15  SpO2 100%  LMP 05/06/2013 Physical Exam  Nursing note and vitals reviewed. Constitutional: She appears well-developed and well-nourished.  Actively vomiting.  Eyes: No scleral icterus.  Cardiovascular: Normal rate, regular rhythm and normal heart sounds.   Pulmonary/Chest: Effort normal and breath sounds normal. No respiratory distress. She has no wheezes. She has no rales.  Abdominal: Soft. Bowel sounds are normal. She exhibits no distension. There is no rebound and no guarding.  Diffuse abdominal tenderness  Genitourinary: Vagina normal.  External genitalia normal. No cmt. No adnexal tenderness  Musculoskeletal: She exhibits no edema.  Neurological: She is alert.  Skin: Skin is warm and dry.    ED Course  Procedures (including critical care time)  11:23 AM pt seen and examined. Multiple visits for the same in the last few months.   Results for orders placed during the hospital encounter of 05/18/13  WET PREP, GENITAL      Result Value Range   Yeast Wet Prep HPF POC NONE SEEN  NONE SEEN   Trich, Wet Prep FEW (*) NONE SEEN   Clue Cells  Wet Prep HPF POC NONE SEEN  NONE SEEN   WBC, Wet Prep HPF POC FEW (*) NONE SEEN  URINALYSIS W MICROSCOPIC + REFLEX CULTURE      Result Value Range   Color, Urine AMBER (*) YELLOW   APPearance CLOUDY (*) CLEAR   Specific Gravity, Urine 1.022  1.005 - 1.030   pH 8.0  5.0 - 8.0   Glucose, UA NEGATIVE  NEGATIVE mg/dL   Hgb urine dipstick NEGATIVE  NEGATIVE   Bilirubin Urine NEGATIVE  NEGATIVE   Ketones, ur NEGATIVE  NEGATIVE mg/dL   Protein, ur NEGATIVE  NEGATIVE mg/dL   Urobilinogen, UA 1.0  0.0 - 1.0 mg/dL   Nitrite NEGATIVE  NEGATIVE   Leukocytes, UA SMALL (*) NEGATIVE   WBC, UA 7-10  <3  WBC/hpf   RBC / HPF 0-2  <3 RBC/hpf   Bacteria, UA FEW (*) RARE   Squamous Epithelial / LPF RARE  RARE   Urine-Other AMORPHOUS URATES/PHOSPHATES    CBC WITH DIFFERENTIAL      Result Value Range   WBC 8.9  4.0 - 10.5 K/uL   RBC 4.40  3.87 - 5.11 MIL/uL   Hemoglobin 14.8  12.0 - 15.0 g/dL   HCT 62.1  30.8 - 65.7 %   MCV 95.2  78.0 - 100.0 fL   MCH 33.6  26.0 - 34.0 pg   MCHC 35.3  30.0 - 36.0 g/dL   RDW 84.6  96.2 - 95.2 %   Platelets 192  150 - 400 K/uL   Neutrophils Relative % 73  43 - 77 %   Neutro Abs 6.4  1.7 - 7.7 K/uL   Lymphocytes Relative 22  12 - 46 %   Lymphs Abs 2.0  0.7 - 4.0 K/uL   Monocytes Relative 5  3 - 12 %   Monocytes Absolute 0.4  0.1 - 1.0 K/uL   Eosinophils Relative 0  0 - 5 %   Eosinophils Absolute 0.0  0.0 - 0.7 K/uL   Basophils Relative 0  0 - 1 %   Basophils Absolute 0.0  0.0 - 0.1 K/uL  COMPREHENSIVE METABOLIC PANEL      Result Value Range   Sodium 141  135 - 145 mEq/L   Potassium 3.4 (*) 3.5 - 5.1 mEq/L   Chloride 102  96 - 112 mEq/L   CO2 26  19 - 32 mEq/L   Glucose, Bld 108 (*) 70 - 99 mg/dL   BUN 6  6 - 23 mg/dL   Creatinine, Ser 8.41  0.50 - 1.10 mg/dL   Calcium 32.4  8.4 - 40.1 mg/dL   Total Protein 8.9 (*) 6.0 - 8.3 g/dL   Albumin 5.1  3.5 - 5.2 g/dL   AST 26  0 - 37 U/L   ALT 22  0 - 35 U/L   Alkaline Phosphatase 82  39 - 117 U/L   Total Bilirubin 0.7  0.3 - 1.2 mg/dL   GFR calc non Af Amer >90  >90 mL/min   GFR calc Af Amer >90  >90 mL/min  LIPASE, BLOOD      Result Value Range   Lipase 21  11 - 59 U/L  OCCULT BLOOD GASTRIC / DUODENUM (SPECIMEN CUP)      Result Value Range   pH, Gastric NOT DONE     Occult Blood, Gastric POSITIVE (*) NEGATIVE  POCT PREGNANCY, URINE      Result Value Range   Preg Test, Ur NEGATIVE  NEGATIVE  1. Nausea vomiting and diarrhea   2. Cannabis dependence   3. Trichomonal infection     MDM  Pt with nausea, vomiting, only 1 episode of diarrhea. This is a recurrent problem. States it improves  with treatment in ED, but keeps reoccurring. Pt admits to daily marijuana use. Lab work today unremarkable. Pt treated with IV NS, zofran, morphine, ativan. Pt feels better with treatments. Treated for trichomonas infection in ED. Her gastric occult is positive, however, no active hemorrhaging. Vomiting resolved. H&H normal. Suspect mallory Weis tear from vomiting. Abdomen reassessed. Soft, no acute abdomen on exam. Question whether this could be due to her cannabis use, cannabinoid hyperemesis syndrome. Instructed to stop smoking. Will d/c home with reglan, and phenergan with close follow up.   Filed Vitals:   05/18/13 1230 05/18/13 1245 05/18/13 1300 05/18/13 1315  BP: 117/83 138/80 149/95 159/104  Pulse: 81 51 53 52  Temp:      TempSrc:      Resp:      SpO2: 99% 100% 100% 100%     Lottie Mussel, PA-C 05/18/13 1401  Neelie Welshans A Ariannah Arenson, PA-C 05/18/13 1534

## 2013-05-18 NOTE — ED Notes (Signed)
Pt requesting pain medications. tatiyana made aware- sts will not prescribe pt can take OTC ibuprofen and tylenol. Pt made aware.

## 2013-05-19 LAB — URINE CULTURE: Colony Count: >=100000

## 2013-05-19 LAB — GC/CHLAMYDIA PROBE AMP: GC Probe RNA: NEGATIVE

## 2013-05-20 ENCOUNTER — Telehealth (HOSPITAL_COMMUNITY): Payer: Self-pay | Admitting: Emergency Medicine

## 2013-05-20 NOTE — ED Notes (Signed)
Post ED Visit - Positive Culture Follow-up  Culture report reviewed by antimicrobial stewardship pharmacist: []  Wes Dulaney, Pharm.D., BCPS []  Celedonio Miyamoto, Pharm.D., BCPS []  Georgina Pillion, Pharm.D., BCPS []  Hardy, [address].D., BCPS, AAHIVP []  Estella Husk, Pharm.D., BCPS, AAHIVP [x]  Okey Regal, Pharm.D., BCPS  Positive urine culture Likely contaminant, no treatment needed and no further patient follow-up is required at this time.  Kylie A Holland 05/20/2013, 5:53 PM

## 2013-05-29 NOTE — ED Provider Notes (Signed)
Medical screening examination/treatment/procedure(s) were performed by resident physician or non-physician practitioner and as supervising physician I was immediately available for consultation/collaboration.   KINDL,JAMES DOUGLAS MD.   James D Kindl, MD 05/29/13 0845 

## 2015-04-01 ENCOUNTER — Emergency Department (HOSPITAL_COMMUNITY): Payer: Self-pay

## 2015-04-01 ENCOUNTER — Emergency Department (HOSPITAL_COMMUNITY)
Admission: EM | Admit: 2015-04-01 | Discharge: 2015-04-01 | Disposition: A | Discharge status: Home / Self Care | Payer: Self-pay | Attending: Emergency Medicine | Admitting: Emergency Medicine

## 2015-04-01 DIAGNOSIS — R1084 Generalized abdominal pain: Secondary | ICD-10-CM | POA: Insufficient documentation

## 2015-04-01 DIAGNOSIS — R197 Diarrhea, unspecified: Secondary | ICD-10-CM | POA: Insufficient documentation

## 2015-04-01 DIAGNOSIS — Z79899 Other long term (current) drug therapy: Secondary | ICD-10-CM | POA: Insufficient documentation

## 2015-04-01 DIAGNOSIS — R109 Unspecified abdominal pain: Secondary | ICD-10-CM

## 2015-04-01 DIAGNOSIS — R112 Nausea with vomiting, unspecified: Secondary | ICD-10-CM | POA: Insufficient documentation

## 2015-04-01 DIAGNOSIS — Z8669 Personal history of other diseases of the nervous system and sense organs: Secondary | ICD-10-CM | POA: Insufficient documentation

## 2015-04-01 DIAGNOSIS — R1111 Vomiting without nausea: Secondary | ICD-10-CM

## 2015-04-01 DIAGNOSIS — F329 Major depressive disorder, single episode, unspecified: Secondary | ICD-10-CM | POA: Insufficient documentation

## 2015-04-01 DIAGNOSIS — Z8719 Personal history of other diseases of the digestive system: Secondary | ICD-10-CM | POA: Insufficient documentation

## 2015-04-01 DIAGNOSIS — O26899 Other specified pregnancy related conditions, unspecified trimester: Secondary | ICD-10-CM

## 2015-04-01 DIAGNOSIS — Z720 Tobacco use: Secondary | ICD-10-CM | POA: Insufficient documentation

## 2015-04-01 DIAGNOSIS — I10 Essential (primary) hypertension: Secondary | ICD-10-CM | POA: Insufficient documentation

## 2015-04-01 LAB — URINALYSIS, ROUTINE W REFLEX MICROSCOPIC
BILIRUBIN URINE: NEGATIVE
Glucose, UA: NEGATIVE mg/dL
Hgb urine dipstick: NEGATIVE
KETONES UR: 15 mg/dL — AB
Nitrite: NEGATIVE
PH: 7 (ref 5.0–8.0)
PROTEIN: 30 mg/dL — AB
Specific Gravity, Urine: 1.036 — ABNORMAL HIGH (ref 1.005–1.030)
UROBILINOGEN UA: 1 mg/dL (ref 0.0–1.0)

## 2015-04-01 LAB — COMPREHENSIVE METABOLIC PANEL
ALBUMIN: 4.3 g/dL (ref 3.5–5.0)
ALK PHOS: 64 U/L (ref 38–126)
ALT: 21 U/L (ref 14–54)
AST: 28 U/L (ref 15–41)
Anion gap: 13 (ref 5–15)
BILIRUBIN TOTAL: 0.7 mg/dL (ref 0.3–1.2)
BUN: 10 mg/dL (ref 6–20)
CHLORIDE: 100 mmol/L — AB (ref 101–111)
CO2: 24 mmol/L (ref 22–32)
CREATININE: 0.93 mg/dL (ref 0.44–1.00)
Calcium: 9.7 mg/dL (ref 8.9–10.3)
GFR calc Af Amer: >60 mL/min (ref >60)
Glucose, Bld: 120 mg/dL — ABNORMAL HIGH (ref 70–99)
POTASSIUM: 3.6 mmol/L (ref 3.5–5.1)
SODIUM: 137 mmol/L (ref 135–145)
Total Protein: 8.2 g/dL — ABNORMAL HIGH (ref 6.5–8.1)

## 2015-04-01 LAB — LIPASE, BLOOD: Lipase: 31 U/L (ref 22–51)

## 2015-04-01 LAB — CBC WITH DIFFERENTIAL/PLATELET
Basophils Absolute: 0 10*3/uL (ref 0.0–0.1)
Basophils Relative: 0 % (ref 0–1)
Eosinophils Absolute: 0 10*3/uL (ref 0.0–0.7)
Eosinophils Relative: 0 % (ref 0–5)
HEMATOCRIT: 37.7 % (ref 36.0–46.0)
HEMOGLOBIN: 12.8 g/dL (ref 12.0–15.0)
LYMPHS ABS: 2.2 10*3/uL (ref 0.7–4.0)
LYMPHS PCT: 22 % (ref 12–46)
MCH: 32.7 pg (ref 26.0–34.0)
MCHC: 34 g/dL (ref 30.0–36.0)
MCV: 96.4 fL (ref 78.0–100.0)
MONOS PCT: 4 % (ref 3–12)
Monocytes Absolute: 0.4 10*3/uL (ref 0.1–1.0)
NEUTROS ABS: 7.2 10*3/uL (ref 1.7–7.7)
Neutrophils Relative %: 74 % (ref 43–77)
Platelets: 214 10*3/uL (ref 150–400)
RBC: 3.91 MIL/uL (ref 3.87–5.11)
RDW: 12.7 % (ref 11.5–15.5)
WBC: 9.9 10*3/uL (ref 4.0–10.5)

## 2015-04-01 LAB — POC URINE PREG, ED: PREG TEST UR: NEGATIVE

## 2015-04-01 LAB — URINE MICROSCOPIC-ADD ON

## 2015-04-01 MED ORDER — IOHEXOL 300 MG/ML  SOLN
100.0000 mL | Freq: Once | INTRAMUSCULAR | Status: AC | PRN
  Administered 2015-04-01: 100 mL via INTRAVENOUS

## 2015-04-01 MED ORDER — ONDANSETRON 4 MG PO TBDP: 4.0000 mg | ORAL_TABLET | Freq: Three times a day (TID) | ORAL | Status: DC | PRN

## 2015-04-01 MED ORDER — IOHEXOL 300 MG/ML  SOLN: 25.0000 mL | Freq: Once | INTRAMUSCULAR | Status: DC | PRN

## 2015-04-01 MED ORDER — HYDROMORPHONE HCL 1 MG/ML IJ SOLN
1.0000 mg | Freq: Once | INTRAMUSCULAR | Status: AC
  Administered 2015-04-01: 1 mg via INTRAVENOUS
  Filled 2015-04-01: qty 1

## 2015-04-01 MED ORDER — FAMOTIDINE IN NACL 20-0.9 MG/50ML-% IV SOLN
20.0000 mg | Freq: Once | INTRAVENOUS | Status: AC
  Administered 2015-04-01: 20 mg via INTRAVENOUS
  Filled 2015-04-01: qty 50

## 2015-04-01 MED ORDER — ONDANSETRON HCL 4 MG/2ML IJ SOLN
4.0000 mg | Freq: Once | INTRAMUSCULAR | Status: AC
  Administered 2015-04-01: 4 mg via INTRAVENOUS
  Filled 2015-04-01: qty 2

## 2015-04-01 NOTE — ED Notes (Signed)
CT notified patient is ready for transport.   

## 2015-04-01 NOTE — ED Notes (Signed)
Pt had acute onset of lower abd pain approx 5 hours ago. Pt was sent home from work for N&V. Last BM was approx 1 hr ago green and soft.

## 2015-04-01 NOTE — ED Provider Notes (Signed)
CSN: [id number]     Arrival date & time 04/01/15  0539 History   First MD Initiated Contact with Patient 04/01/15 0617     Chief Complaint  Patient presents with  . Abdominal Pain     (Consider location/radiation/quality/duration/timing/severity/associated sxs/prior Treatment) Patient is a 29 y.o. female presenting with abdominal pain. The history is provided by the patient. No language interpreter was used.  Abdominal Pain Pain location:  Generalized Pain quality: aching   Pain radiates to:  Does not radiate Pain severity:  Moderate Onset quality:  Gradual Duration:  1 day Timing:  Constant Progression:  Worsening Chronicity:  New Relieved by:  Nothing Worsened by:  Nothing tried Ineffective treatments:  None tried Associated symptoms: diarrhea and vomiting   Risk factors: no aspirin use     Past Medical History  Diagnosis Date  . Seasonal allergies   . Alcohol abuse   . Hypertension   . Reflux   . Seizures   . Anxiety    No past surgical history on file. No family history on file. History  Substance Use Topics  . Smoking status: Current Every Day Smoker    Types: Cigarettes  . Smokeless tobacco: Not on file  . Alcohol Use: Yes     Comment: clean 07/31/12   OB History    No data available     Review of Systems  Gastrointestinal: Positive for vomiting, abdominal pain and diarrhea.  All other systems reviewed and are negative.     Allergies  Tomato  Home Medications   Prior to Admission medications   Medication Sig Start Date End Date Taking? Authorizing Provider  OLANZapine (ZYPREXA) 15 MG tablet Take 15 mg by mouth at bedtime.   Yes Historical Provider, MD   BP 156/95 mmHg  Pulse 64  Temp(Src) 98.2 F (36.8 C) (Oral)  Ht 5' 2.5" (1.588 m)  Wt 138 lb (62.596 kg)  BMI 24.82 kg/m2  SpO2 100% Physical Exam  Constitutional: She is oriented to person, place, and time. She appears well-developed and well-nourished.  HENT:  Head: Normocephalic and  atraumatic.  Right Ear: External ear normal.  Nose: Nose normal.  Mouth/Throat: Oropharynx is clear and moist.  Eyes: Conjunctivae and EOM are normal. Pupils are equal, round, and reactive to light.  Neck: Normal range of motion.  Cardiovascular: Normal rate and normal heart sounds.   Pulmonary/Chest: Effort normal.  Abdominal: Soft. She exhibits no distension.  Musculoskeletal: Normal range of motion.  Neurological: She is alert and oriented to person, place, and time.  Skin: Skin is warm.  Psychiatric: She has a normal mood and affect.  Nursing note and vitals reviewed.   ED Course  Procedures (including critical care time) Labs Review Labs Reviewed  COMPREHENSIVE METABOLIC PANEL - Abnormal; Notable for the following:    Chloride 100 (*)    Glucose, Bld 120 (*)    Total Protein 8.2 (*)    All other components within normal limits  CBC WITH DIFFERENTIAL/PLATELET  LIPASE, BLOOD  URINALYSIS, ROUTINE W REFLEX MICROSCOPIC  PREGNANCY, URINE    Imaging Review Ct Abdomen Pelvis W Contrast  04/01/2015   CLINICAL DATA:  29 year old female with abdominal pain nausea vomiting and diarrhea for 1 day.  EXAM: CT ABDOMEN AND PELVIS WITH CONTRAST  TECHNIQUE: Multidetector CT imaging of the abdomen and pelvis was performed using the standard protocol following bolus administration of intravenous contrast.  CONTRAST:  100mL OMNIPAQUE IOHEXOL 300 MG/ML  SOLN  COMPARISON:  CT Abdomen and Pelvis  04/11/2009.  FINDINGS: Negative lung bases.  No pericardial or pleural effusion.  No osseous abnormality identified.  No definite pelvic free fluid. Uterus and adnexa appear within normal limits. The bladder is decompressed. The distal colon is decompressed.  Sigmoid and left colon are decompressed. Transverse colon is decompressed. Right colon is mostly decompressed. The cecum and appendix are normal. The terminal ileum is within normal limits. No dilated small bowel. No inflamed bowel loops are identified.  There is oral contrast in the stomach, duodenum, and proximal jejunum which appear normal.  Liver, gallbladder, spleen, pancreas and adrenal glands are normal. Portal venous system is patent. Major arterial structures are normal. No abdominal free fluid. Both kidneys are normally enhancing. No hydronephrosis or hydroureter. No lymphadenopathy.  IMPRESSION: Normal appendix. No acute or inflammatory process identified in the abdomen or pelvis.   Electronically Signed   By: Odessa FlemingH  Hall M.D.   On: 04/01/2015 09:53     EKG Interpretation None      MDM   Final diagnoses:  Abdominal pain complicating pregnancy  Abdominal pain in female  Non-intractable vomiting without nausea, vomiting of unspecified type    Pt feels better after Iv fluids and zofran.     Lonia SkinnerLeslie K BullheadSofia, PA-C 04/01/15 1243  Bethann BerkshireJoseph Zammit, MD 04/01/15 1425

## 2015-04-01 NOTE — Discharge Instructions (Signed)
Abdominal Pain °Many things can cause abdominal pain. Usually, abdominal pain is not caused by a disease and will improve without treatment. It can often be observed and treated at home. Your health care provider will do a physical exam and possibly order blood tests and X-rays to help determine the seriousness of your pain. However, in many cases, more time must pass before a clear cause of the pain can be found. Before that point, your health care provider may not know if you need more testing or further treatment. °HOME CARE INSTRUCTIONS  °Monitor your abdominal pain for any changes. The following actions may help to alleviate any discomfort you are experiencing: °· Only take over-the-counter or prescription medicines as directed by your health care provider. °· Do not take laxatives unless directed to do so by your health care provider. °· Try a clear liquid diet (broth, tea, or water) as directed by your health care provider. Slowly move to a bland diet as tolerated. °SEEK MEDICAL CARE IF: °· You have unexplained abdominal pain. °· You have abdominal pain associated with nausea or diarrhea. °· You have pain when you urinate or have a bowel movement. °· You experience abdominal pain that wakes you in the night. °· You have abdominal pain that is worsened or improved by eating food. °· You have abdominal pain that is worsened with eating fatty foods. °· You have a fever. °SEEK IMMEDIATE MEDICAL CARE IF:  °· Your pain does not go away within 2 hours. °· You keep throwing up (vomiting). °· Your pain is felt only in portions of the abdomen, such as the right side or the left lower portion of the abdomen. °· You pass bloody or black tarry stools. °MAKE SURE YOU: °· Understand these instructions.   °· Will watch your condition.   °· Will get help right away if you are not doing well or get worse.   °Document Released: 08/18/2005 Document Revised: 11/13/2013 Document Reviewed: 07/18/2013 °[employer]® Patient Information  ©2015 [employer]. This information is not intended to replace advice given to you by your health care provider. Make sure you discuss any questions you have with your health care provider. ° °Nausea and Vomiting °Nausea is a sick feeling that often comes before throwing up (vomiting). Vomiting is a reflex where stomach contents come out of your mouth. Vomiting can cause severe loss of body fluids (dehydration). Children and elderly adults can become dehydrated quickly, especially if they also have diarrhea. Nausea and vomiting are symptoms of a condition or disease. It is important to find the cause of your symptoms. °CAUSES  °· Direct irritation of the stomach lining. This irritation can result from increased acid production (gastroesophageal reflux disease), infection, food poisoning, taking certain medicines (such as nonsteroidal anti-inflammatory drugs), alcohol use, or tobacco use. °· Signals from the brain. These signals could be caused by a headache, heat exposure, an inner ear disturbance, increased pressure in the brain from injury, infection, a tumor, or a concussion, pain, emotional stimulus, or metabolic problems. °· An obstruction in the gastrointestinal tract (bowel obstruction). °· Illnesses such as diabetes, hepatitis, gallbladder problems, appendicitis, kidney problems, cancer, sepsis, atypical symptoms of a heart attack, or eating disorders. °· Medical treatments such as chemotherapy and radiation. °· Receiving medicine that makes you sleep (general anesthetic) during surgery. °DIAGNOSIS °Your caregiver may ask for tests to be done if the problems do not improve after a few days. Tests may also be done if symptoms are severe or if the reason for the nausea   and vomiting is not clear. Tests may include: °· Urine tests. °· Blood tests. °· Stool tests. °· Cultures (to look for evidence of infection). °· X-rays or other imaging studies. °Test results can help your caregiver make decisions about  treatment or the need for additional tests. °TREATMENT °You need to stay well hydrated. Drink frequently but in small amounts. You may wish to drink water, sports drinks, clear broth, or eat frozen ice pops or gelatin dessert to help stay hydrated. When you eat, eating slowly may help prevent nausea. There are also some antinausea medicines that may help prevent nausea. °HOME CARE INSTRUCTIONS  °· Take all medicine as directed by your caregiver. °· If you do not have an appetite, do not force yourself to eat. However, you must continue to drink fluids. °· If you have an appetite, eat a normal diet unless your caregiver tells you differently. °¨ Eat a variety of complex carbohydrates (rice, wheat, potatoes, bread), lean meats, yogurt, fruits, and vegetables. °¨ Avoid high-fat foods because they are more difficult to digest. °· Drink enough water and fluids to keep your urine clear or pale yellow. °· If you are dehydrated, ask your caregiver for specific rehydration instructions. Signs of dehydration may include: °¨ Severe thirst. °¨ Dry lips and mouth. °¨ Dizziness. °¨ Dark urine. °¨ Decreasing urine frequency and amount. °¨ Confusion. °¨ Rapid breathing or pulse. °SEEK IMMEDIATE MEDICAL CARE IF:  °· You have blood or brown flecks (like coffee grounds) in your vomit. °· You have black or bloody stools. °· You have a severe headache or stiff neck. °· You are confused. °· You have severe abdominal pain. °· You have chest pain or trouble breathing. °· You do not urinate at least once every 8 hours. °· You develop cold or clammy skin. °· You continue to vomit for longer than 24 to 48 hours. °· You have a fever. °MAKE SURE YOU:  °· Understand these instructions. °· Will watch your condition. °· Will get help right away if you are not doing well or get worse. °Document Released: 11/08/2005 Document Revised: 01/31/2012 Document Reviewed: 04/07/2011 °ExitCare® Patient Information ©2015 [employer]. This information is not  intended to replace advice given to you by your health care provider. Make sure you discuss any questions you have with your health care provider. ° °

## 2015-04-02 ENCOUNTER — Emergency Department (HOSPITAL_COMMUNITY)
Admission: EM | Admit: 2015-04-02 | Discharge: 2015-04-03 | Disposition: A | Discharge status: Home / Self Care | Payer: Self-pay | Attending: Emergency Medicine | Admitting: Emergency Medicine

## 2015-04-02 ENCOUNTER — Encounter (HOSPITAL_COMMUNITY): Payer: Self-pay | Admitting: Emergency Medicine

## 2015-04-02 ENCOUNTER — Emergency Department (HOSPITAL_COMMUNITY)
Admission: EM | Admit: 2015-04-02 | Discharge: 2015-04-02 | Discharge status: Against Medical Advice | Payer: Self-pay | Attending: Emergency Medicine | Admitting: Emergency Medicine

## 2015-04-02 DIAGNOSIS — I10 Essential (primary) hypertension: Secondary | ICD-10-CM | POA: Insufficient documentation

## 2015-04-02 DIAGNOSIS — Z720 Tobacco use: Secondary | ICD-10-CM | POA: Insufficient documentation

## 2015-04-02 DIAGNOSIS — R1013 Epigastric pain: Secondary | ICD-10-CM | POA: Insufficient documentation

## 2015-04-02 DIAGNOSIS — K920 Hematemesis: Secondary | ICD-10-CM | POA: Insufficient documentation

## 2015-04-02 DIAGNOSIS — R112 Nausea with vomiting, unspecified: Secondary | ICD-10-CM

## 2015-04-02 DIAGNOSIS — Z8659 Personal history of other mental and behavioral disorders: Secondary | ICD-10-CM | POA: Insufficient documentation

## 2015-04-02 LAB — CBC
HCT: 39.1 % (ref 36.0–46.0)
Hemoglobin: 12.9 g/dL (ref 12.0–15.0)
MCH: 31.8 pg (ref 26.0–34.0)
MCHC: 33 g/dL (ref 30.0–36.0)
MCV: 96.3 fL (ref 78.0–100.0)
PLATELETS: 187 10*3/uL (ref 150–400)
RBC: 4.06 MIL/uL (ref 3.87–5.11)
RDW: 12.6 % (ref 11.5–15.5)
WBC: 10.6 10*3/uL — ABNORMAL HIGH (ref 4.0–10.5)

## 2015-04-02 MED ORDER — HYDROMORPHONE HCL 1 MG/ML IJ SOLN
0.5000 mg | Freq: Once | INTRAMUSCULAR | Status: AC
  Administered 2015-04-02: 0.5 mg via INTRAVENOUS
  Filled 2015-04-02: qty 1

## 2015-04-02 MED ORDER — PROMETHAZINE HCL 25 MG/ML IJ SOLN
25.0000 mg | Freq: Once | INTRAMUSCULAR | Status: AC
  Administered 2015-04-02: 25 mg via INTRAVENOUS
  Filled 2015-04-02: qty 1

## 2015-04-02 MED ORDER — SODIUM CHLORIDE 0.9 % IV BOLUS (SEPSIS)
1000.0000 mL | Freq: Once | INTRAVENOUS | Status: AC
  Administered 2015-04-02: 1000 mL via INTRAVENOUS

## 2015-04-02 MED ORDER — ONDANSETRON HCL 4 MG/2ML IJ SOLN
4.0000 mg | Freq: Once | INTRAMUSCULAR | Status: AC
  Administered 2015-04-02: 4 mg via INTRAVENOUS
  Filled 2015-04-02: qty 2

## 2015-04-02 MED ORDER — LORAZEPAM 2 MG/ML IJ SOLN
0.5000 mg | Freq: Once | INTRAMUSCULAR | Status: AC
  Administered 2015-04-03: 0.5 mg via INTRAVENOUS
  Filled 2015-04-02: qty 1

## 2015-04-02 MED ORDER — HYDROMORPHONE HCL 1 MG/ML IJ SOLN
0.5000 mg | Freq: Once | INTRAMUSCULAR | Status: AC
  Administered 2015-04-03: 0.5 mg via INTRAVENOUS
  Filled 2015-04-02: qty 1

## 2015-04-02 NOTE — ED Provider Notes (Signed)
11:56 PM Care from Dr Hyacinth MeekerMiller. CT abd/pelvis yesterday wtihout pathology. Still with n/v. Symptom control in ER. Repeat abdominal exam wtihout tenderness. Still with mild nausea and crampy upper abdominal pain. Vitals normal  Taylor BilisKevin Najia Hurlbutt, MD 04/02/15 [phone]

## 2015-04-02 NOTE — ED Provider Notes (Signed)
CSN: [id number]     Arrival date & time 04/02/15  2035 History   First MD Initiated Contact with Patient 04/02/15 2142     Chief Complaint  Patient presents with  . Hematemesis    The patient was seen here for nausea and sent home with Ondasetron.  The patient is back becuase she has blood in her emesis.  She just got her medication and took one dose and she said it did not work.     (Consider location/radiation/quality/duration/timing/severity/associated sxs/prior Treatment) HPI Comments: 29 year old female, she has a history of recurrent nausea vomiting and occasional diarrhea, looking at the medical record she has one week every year where she develops persistent and recurrent nausea vomiting abdominal cramping. She denies any blood in her stools but has had some blood in her vomit from the multiple episodes of recurrent vomiting today. She was seen yesterday for the same complaint, had normal lab tests including negative pregnancy, negative urine, blood counts in conference of metabolic panel and lipase were all unremarkable. She has had persistent and unrelenting symptoms since that time despite taking Zofran. She denies any back pain, dysuria, vaginal bleeding or discharge, no swelling or rashes, no headache, sinusitis, nasal drainage, sore throat or any other complaints. She does smoke marijuana, she states that she "smoked two blunts a day".  The history is provided by the patient.    Past Medical History  Diagnosis Date  . Seasonal allergies   . Alcohol abuse   . Hypertension   . Reflux   . Seizures   . Anxiety    History reviewed. No pertinent past surgical history. History reviewed. No pertinent family history. History  Substance Use Topics  . Smoking status: Current Every Day Smoker    Types: Cigarettes  . Smokeless tobacco: Not on file  . Alcohol Use: Yes     Comment: clean 07/31/12   OB History    No data available     Review of Systems  All other systems reviewed  and are negative.     Allergies  Tomato  Home Medications   Prior to Admission medications   Medication Sig Start Date End Date Taking? Authorizing Provider  OLANZapine (ZYPREXA) 15 MG tablet Take 15 mg by mouth at bedtime.   Yes Historical Provider, MD  ondansetron (ZOFRAN ODT) 4 MG disintegrating tablet Take 1 tablet (4 mg total) by mouth every 8 (eight) hours as needed for nausea or vomiting. 04/01/15  Yes Lonia SkinnerLeslie K Sofia, PA-C   BP 167/117 mmHg  Pulse 71  Temp(Src) 98.5 F (36.9 C) (Oral)  Resp 16  SpO2 100%  LMP 03/18/2015 (Approximate) Physical Exam  Constitutional: She appears well-developed and well-nourished.  HENT:  Head: Normocephalic and atraumatic.  Mouth/Throat: Oropharynx is clear and moist. No oropharyngeal exudate.  Eyes: Conjunctivae and EOM are normal. Pupils are equal, round, and reactive to light. Right eye exhibits no discharge. Left eye exhibits no discharge. No scleral icterus.  Neck: Normal range of motion. Neck supple. No JVD present. No thyromegaly present.  Cardiovascular: Normal rate, regular rhythm, normal heart sounds and intact distal pulses.  Exam reveals no gallop and no friction rub.   No murmur heard. Pulmonary/Chest: Effort normal and breath sounds normal. No respiratory distress. She has no wheezes. She has no rales.  Abdominal: Soft. Bowel sounds are normal. She exhibits no distension ( No abdominal distention) and no mass. There is tenderness (mild epigastric tenderness, no guarding). There is no rebound and no guarding.  Musculoskeletal: Normal range of motion. She exhibits no edema or tenderness.  Lymphadenopathy:    She has no cervical adenopathy.  Neurological: She is alert. Coordination normal.  Skin: Skin is warm and dry. No rash noted. No erythema.  Psychiatric: She has a normal mood and affect. Her behavior is normal.  Nursing note and vitals reviewed.   ED Course  Procedures (including critical care time) Labs Review Labs  Reviewed - No data to display  Imaging Review Ct Abdomen Pelvis W Contrast  04/01/2015   CLINICAL DATA:  29 year old female with abdominal pain nausea vomiting and diarrhea for 1 day.  EXAM: CT ABDOMEN AND PELVIS WITH CONTRAST  TECHNIQUE: Multidetector CT imaging of the abdomen and pelvis was performed using the standard protocol following bolus administration of intravenous contrast.  CONTRAST:  100mL OMNIPAQUE IOHEXOL 300 MG/ML  SOLN  COMPARISON:  CT Abdomen and Pelvis 04/11/2009.  FINDINGS: Negative lung bases.  No pericardial or pleural effusion.  No osseous abnormality identified.  No definite pelvic free fluid. Uterus and adnexa appear within normal limits. The bladder is decompressed. The distal colon is decompressed.  Sigmoid and left colon are decompressed. Transverse colon is decompressed. Right colon is mostly decompressed. The cecum and appendix are normal. The terminal ileum is within normal limits. No dilated small bowel. No inflamed bowel loops are identified. There is oral contrast in the stomach, duodenum, and proximal jejunum which appear normal.  Liver, gallbladder, spleen, pancreas and adrenal glands are normal. Portal venous system is patent. Major arterial structures are normal. No abdominal free fluid. Both kidneys are normally enhancing. No hydronephrosis or hydroureter. No lymphadenopathy.  IMPRESSION: Normal appendix. No acute or inflammatory process identified in the abdomen or pelvis.   Electronically Signed   By: Odessa FlemingH  Hall M.D.   On: 04/01/2015 09:53     EKG Interpretation None      MDM   Final diagnoses:  None    The patient is actively vomiting during the exam, she has epigastric discomfort and tenderness, this is likely a combination of vomiting, possibly some alcoholic gastritis, possibly cyclic vomiting syndrome as the patient does smoke large amounts of marijuana for a long time. Her labs yesterday were reviewed, they were all negative, she had a CT abdomen and  pelvis as well, this was normal. The patient will be treated with IV fluids, IV antiemetics, I have placed an IV, see the procedure note below.  Angiocath insertion Performed by: Vida RollerMILLER,Curren Mohrmann D  Consent: Verbal consent obtained. Risks and benefits: risks, benefits and alternatives were discussed Time out: Immediately prior to procedure a "time out" was called to verify the correct patient, procedure, equipment, support staff and site/side marked as required.  Preparation: Patient was prepped and draped in the usual sterile fashion.  Vein Location: R AC  Not Ultrasound Guided  Gauge: 20  Normal blood return and flush without difficulty Patient tolerance: Patient tolerated the procedure well with no immediate complications.     At change of shift - care signed out to Dr. Alexander Mtampos  Mason Burleigh, MD 04/02/15 [phone]

## 2015-04-02 NOTE — ED Notes (Signed)
The patient was seen here for nausea and abdominal pain and sent home with Ondasetron.  The patient is back becuase she has blood in her emesis.  She just got her medication and took one dose and she said it did not work.  The patient rates her pain 10/10.

## 2015-04-02 NOTE — ED Notes (Addendum)
Pt. C/o episode of shortness of breath lasting appx 2-5 minutes. Patient sts painful to take deep inspiration. SpO2 99% and Respirations 24- fast and shallow. Pt instructed in deep breathing techniques to slow down respirations down. Pt. sts "I feel like my whole body wants to shake or move or something". Pt shaking whole body in bed for less than 1 minute, alert and responsive through episode.   Dr. Hyacinth MeekerMiller made aware.

## 2015-04-03 LAB — COMPREHENSIVE METABOLIC PANEL
ALT: 19 U/L (ref 14–54)
AST: 26 U/L (ref 15–41)
Albumin: 4.1 g/dL (ref 3.5–5.0)
Alkaline Phosphatase: 58 U/L (ref 38–126)
Anion gap: 11 (ref 5–15)
BILIRUBIN TOTAL: 0.5 mg/dL (ref 0.3–1.2)
BUN: 9 mg/dL (ref 6–20)
CHLORIDE: 104 mmol/L (ref 101–111)
CO2: 25 mmol/L (ref 22–32)
Calcium: 9.1 mg/dL (ref 8.9–10.3)
Creatinine, Ser: 0.89 mg/dL (ref 0.44–1.00)
GFR calc Af Amer: >60 mL/min (ref >60)
GFR calc non Af Amer: >60 mL/min (ref >60)
Glucose, Bld: 119 mg/dL — ABNORMAL HIGH (ref 70–99)
POTASSIUM: 3.3 mmol/L — AB (ref 3.5–5.1)
Sodium: 140 mmol/L (ref 135–145)
TOTAL PROTEIN: 7.8 g/dL (ref 6.5–8.1)

## 2015-04-03 MED ORDER — PROMETHAZINE HCL 25 MG RE SUPP: 25.0000 mg | Freq: Four times a day (QID) | RECTAL | Status: DC | PRN

## 2015-04-03 MED ORDER — OMEPRAZOLE 20 MG PO CPDR: 20.0000 mg | DELAYED_RELEASE_CAPSULE | Freq: Every day | ORAL | Status: DC

## 2015-04-03 MED ORDER — SUCRALFATE 1 G PO TABS: 1.0000 g | ORAL_TABLET | Freq: Three times a day (TID) | ORAL | Status: DC

## 2015-04-03 NOTE — ED Notes (Signed)
Discharge instructions and prescriptions given, voiced understanding.  

## 2015-04-03 NOTE — Discharge Instructions (Signed)

## 2015-05-17 ENCOUNTER — Encounter (HOSPITAL_COMMUNITY): Payer: Self-pay | Admitting: *Deleted

## 2015-05-17 ENCOUNTER — Emergency Department (HOSPITAL_COMMUNITY)
Admission: EM | Admit: 2015-05-17 | Discharge: 2015-05-18 | Disposition: A | Discharge status: Home / Self Care | Payer: Self-pay | Attending: Emergency Medicine | Admitting: Emergency Medicine

## 2015-05-17 DIAGNOSIS — K219 Gastro-esophageal reflux disease without esophagitis: Secondary | ICD-10-CM | POA: Insufficient documentation

## 2015-05-17 DIAGNOSIS — Z79899 Other long term (current) drug therapy: Secondary | ICD-10-CM | POA: Insufficient documentation

## 2015-05-17 DIAGNOSIS — Z8659 Personal history of other mental and behavioral disorders: Secondary | ICD-10-CM | POA: Insufficient documentation

## 2015-05-17 DIAGNOSIS — R109 Unspecified abdominal pain: Secondary | ICD-10-CM

## 2015-05-17 DIAGNOSIS — Z720 Tobacco use: Secondary | ICD-10-CM | POA: Insufficient documentation

## 2015-05-17 DIAGNOSIS — I10 Essential (primary) hypertension: Secondary | ICD-10-CM | POA: Insufficient documentation

## 2015-05-17 DIAGNOSIS — Z3202 Encounter for pregnancy test, result negative: Secondary | ICD-10-CM | POA: Insufficient documentation

## 2015-05-17 DIAGNOSIS — R6883 Chills (without fever): Secondary | ICD-10-CM | POA: Insufficient documentation

## 2015-05-17 DIAGNOSIS — R112 Nausea with vomiting, unspecified: Secondary | ICD-10-CM | POA: Insufficient documentation

## 2015-05-17 LAB — COMPREHENSIVE METABOLIC PANEL
ALT: 21 U/L (ref 14–54)
ANION GAP: 15 (ref 5–15)
AST: 36 U/L (ref 15–41)
Albumin: 4.7 g/dL (ref 3.5–5.0)
Alkaline Phosphatase: 59 U/L (ref 38–126)
BUN: 10 mg/dL (ref 6–20)
CHLORIDE: 93 mmol/L — AB (ref 101–111)
CO2: 24 mmol/L (ref 22–32)
CREATININE: 0.87 mg/dL (ref 0.44–1.00)
Calcium: 9.9 mg/dL (ref 8.9–10.3)
GLUCOSE: 123 mg/dL — AB (ref 65–99)
POTASSIUM: 3.2 mmol/L — AB (ref 3.5–5.1)
SODIUM: 132 mmol/L — AB (ref 135–145)
TOTAL PROTEIN: 8.5 g/dL — AB (ref 6.5–8.1)
Total Bilirubin: 0.9 mg/dL (ref 0.3–1.2)

## 2015-05-17 LAB — CBC WITH DIFFERENTIAL/PLATELET
BASOS PCT: 0 % (ref 0–1)
Basophils Absolute: 0 10*3/uL (ref 0.0–0.1)
EOS ABS: 0.1 10*3/uL (ref 0.0–0.7)
Eosinophils Relative: 1 % (ref 0–5)
HEMATOCRIT: 39.5 % (ref 36.0–46.0)
Hemoglobin: 13.6 g/dL (ref 12.0–15.0)
Lymphocytes Relative: 27 % (ref 12–46)
Lymphs Abs: 3.6 10*3/uL (ref 0.7–4.0)
MCH: 32.5 pg (ref 26.0–34.0)
MCHC: 34.4 g/dL (ref 30.0–36.0)
MCV: 94.3 fL (ref 78.0–100.0)
Monocytes Absolute: 0.9 10*3/uL (ref 0.1–1.0)
Monocytes Relative: 7 % (ref 3–12)
NEUTROS ABS: 8.7 10*3/uL — AB (ref 1.7–7.7)
Neutrophils Relative %: 65 % (ref 43–77)
PLATELETS: 212 10*3/uL (ref 150–400)
RBC: 4.19 MIL/uL (ref 3.87–5.11)
RDW: 12.8 % (ref 11.5–15.5)
WBC: 13.3 10*3/uL — ABNORMAL HIGH (ref 4.0–10.5)

## 2015-05-17 LAB — POC URINE PREG, ED: Preg Test, Ur: NEGATIVE

## 2015-05-17 LAB — LIPASE, BLOOD: LIPASE: 30 U/L (ref 22–51)

## 2015-05-17 NOTE — ED Notes (Signed)
The pt is c/o abd pain for 2 days with n v and dark colored stools.  lmp due now

## 2015-05-17 NOTE — ED Notes (Signed)
Lab called   Urine spilled unable to get  Urine  Results need a new speciman

## 2015-05-18 LAB — URINALYSIS, ROUTINE W REFLEX MICROSCOPIC
Glucose, UA: NEGATIVE mg/dL
Ketones, ur: 15 mg/dL — AB
NITRITE: NEGATIVE
PROTEIN: 100 mg/dL — AB
SPECIFIC GRAVITY, URINE: 1.033 — AB (ref 1.005–1.030)
Urobilinogen, UA: 1 mg/dL (ref 0.0–1.0)
pH: 6 (ref 5.0–8.0)

## 2015-05-18 LAB — URINE MICROSCOPIC-ADD ON

## 2015-05-18 LAB — POC OCCULT BLOOD, ED: FECAL OCCULT BLD: NEGATIVE

## 2015-05-18 MED ORDER — SODIUM CHLORIDE 0.9 % IV BOLUS (SEPSIS)
1000.0000 mL | Freq: Once | INTRAVENOUS | Status: AC
Start: 2015-05-18 — End: 2015-05-18
  Administered 2015-05-18: 1000 mL via INTRAVENOUS

## 2015-05-18 MED ORDER — ONDANSETRON 4 MG PO TBDP: 4.0000 mg | ORAL_TABLET | Freq: Three times a day (TID) | ORAL | Status: DC | PRN

## 2015-05-18 MED ORDER — MORPHINE SULFATE 4 MG/ML IJ SOLN
4.0000 mg | Freq: Once | INTRAMUSCULAR | Status: AC
Start: 2015-05-18 — End: 2015-05-18
  Administered 2015-05-18: 4 mg via INTRAVENOUS
  Filled 2015-05-18: qty 1

## 2015-05-18 MED ORDER — MORPHINE SULFATE 4 MG/ML IJ SOLN
4.0000 mg | Freq: Once | INTRAMUSCULAR | Status: AC
Start: 2015-05-18 — End: 2015-05-18

## 2015-05-18 MED ORDER — ONDANSETRON HCL 4 MG/2ML IJ SOLN
4.0000 mg | Freq: Once | INTRAMUSCULAR | Status: AC
  Administered 2015-05-18: 4 mg via INTRAVENOUS
  Filled 2015-05-18: qty 2

## 2015-05-18 MED ORDER — DICYCLOMINE HCL 10 MG PO CAPS
20.0000 mg | ORAL_CAPSULE | Freq: Once | ORAL | Status: AC
  Administered 2015-05-18: 20 mg via ORAL
  Filled 2015-05-18: qty 2

## 2015-05-18 NOTE — ED Provider Notes (Signed)
CSN: [id number]     Arrival date & time 05/17/15  2147 History   This chart was scribed for Taylor Crumble, MD by Freida Busman, ED Scribe. This patient was seen in room D35C/D35C and the patient's care was started 12:02 AM.    Chief Complaint  Patient presents with  . Abdominal Pain    The history is provided by the patient. No language interpreter was used.    HPI Comments:  Taylor Bartlett is a 29 y.o. female who presents to the Emergency Department complaining of intermittent diffuse abdominal pain for 2 days. She describes the pain as a ripping sensation. Her pain is exacerbated with deep breathe. Pt has a history of the same; states she has been evaluated multiple times for symptoms but has not yet received a diagnosis. She has not followed up with GI due to lack of insurance. She reports associated nausea, vomiting ~20 episodes and chills. She also notes blood in her emesis during last episodes of vomiting and black colored stool. Pt notes she has taken zofran without relief. She denies diarrhea and urinary symptoms.     Past Medical History  Diagnosis Date  . Seasonal allergies   . Alcohol abuse   . Hypertension   . Reflux   . Seizures   . Anxiety    History reviewed. No pertinent past surgical history. No family history on file. History  Substance Use Topics  . Smoking status: Current Every Day Smoker    Types: Cigarettes  . Smokeless tobacco: Not on file  . Alcohol Use: Yes     Comment: clean 07/31/12   OB History    No data available     Review of Systems  A complete 10 system review of systems was obtained and all systems are negative except as noted in the HPI and PMH.     Allergies  Tomato  Home Medications   Prior to Admission medications   Medication Sig Start Date End Date Taking? Authorizing Provider  OLANZapine (ZYPREXA) 15 MG tablet Take 15 mg by mouth at bedtime.    Historical Provider, MD  omeprazole (PRILOSEC) 20 MG capsule Take 1 capsule  (20 mg total) by mouth daily. 04/03/15   Azalia Bilis, MD  ondansetron (ZOFRAN ODT) 4 MG disintegrating tablet Take 1 tablet (4 mg total) by mouth every 8 (eight) hours as needed for nausea or vomiting. 04/01/15   Elson Areas, PA-C  promethazine (PHENERGAN) 25 MG suppository Place 1 suppository (25 mg total) rectally every 6 (six) hours as needed for nausea or vomiting. 04/03/15   Azalia Bilis, MD  sucralfate (CARAFATE) 1 G tablet Take 1 tablet (1 g total) by mouth 4 (four) times daily -  with meals and at bedtime. 04/03/15   Azalia Bilis, MD   BP 163/103 mmHg  Pulse 99  Temp(Src) 98.1 F (36.7 C) (Oral)  Resp 16  SpO2 98%  LMP 04/16/2015 Physical Exam  Constitutional: She is oriented to person, place, and time. She appears well-developed and well-nourished. No distress.  HENT:  Head: Normocephalic and atraumatic.  Nose: Nose normal.  Mouth/Throat: Oropharynx is clear and moist. No oropharyngeal exudate.  Eyes: Conjunctivae and EOM are normal. Pupils are equal, round, and reactive to light. No scleral icterus.  Neck: Normal range of motion. Neck supple. No JVD present. No tracheal deviation present. No thyromegaly present.  Cardiovascular: Normal rate, regular rhythm and normal heart sounds.  Exam reveals no gallop and no friction rub.   No  murmur heard. Pulmonary/Chest: Effort normal and breath sounds normal. No respiratory distress. She has no wheezes. She exhibits no tenderness.  Abdominal: Soft. Bowel sounds are normal. She exhibits no distension and no mass. There is no tenderness. There is no rebound and no guarding.  Musculoskeletal: Normal range of motion. She exhibits no edema or tenderness.  Lymphadenopathy:    She has no cervical adenopathy.  Neurological: She is alert and oriented to person, place, and time. No cranial nerve deficit. She exhibits normal muscle tone.  Skin: Skin is warm and dry. No rash noted. No erythema. No pallor.  Nursing note and vitals reviewed.   ED  Course  Procedures   DIAGNOSTIC STUDIES:  Oxygen Saturation is 98% on RA, normal by my interpretation.    COORDINATION OF CARE:  12:06 AM Will order IV fluids, pain and nausea meds. Advised pt to follow up with GI. Discussed treatment plan with pt at bedside and pt agreed to plan.  Labs Review Labs Reviewed  CBC WITH DIFFERENTIAL/PLATELET - Abnormal; Notable for the following:    WBC 13.3 (*)    Neutro Abs 8.7 (*)    All other components within normal limits  COMPREHENSIVE METABOLIC PANEL - Abnormal; Notable for the following:    Sodium 132 (*)    Potassium 3.2 (*)    Chloride 93 (*)    Glucose, Bld 123 (*)    Total Protein 8.5 (*)    All other components within normal limits  URINALYSIS, ROUTINE W REFLEX MICROSCOPIC (NOT AT [facility]) - Abnormal; Notable for the following:    Color, Urine AMBER (*)    APPearance TURBID (*)    Specific Gravity, Urine 1.033 (*)    Hgb urine dipstick SMALL (*)    Bilirubin Urine SMALL (*)    Ketones, ur 15 (*)    Protein, ur 100 (*)    Leukocytes, UA LARGE (*)    All other components within normal limits  URINE MICROSCOPIC-ADD ON - Abnormal; Notable for the following:    Squamous Epithelial / LPF FEW (*)    Bacteria, UA FEW (*)    Casts GRANULAR CAST (*)    All other components within normal limits  LIPASE, BLOOD  POC URINE PREG, ED  POC OCCULT BLOOD, ED    Imaging Review No results found.   EKG Interpretation None      MDM   Final diagnoses:  None   Patient since emergency department for abdominal pain. She has 4 visits in the last 6 months for similar complaints. CT scan was performed May 31 which was negative. She is coming back with the same pain as well as nausea and vomiting. I do not have high concern for serious intra-abdominal pathology. Abdominal exam is completely normal. Patient was given morphine, fluids, Zofran for pain control. She has been unable to follow-up with GI due to lack of insurance. Laboratory studies  remained at her baseline. Will perform Hemoccults for history of dark stool.  Hemoccult was negative.  She otherwise appears well and in no acute distress. Her vital signs were within her normal limits and she is safe for discharge.   I personally performed the services described in this documentation, which was scribed in my presence. The recorded information has been reviewed and is accurate.    Taylor Crumble, MD 05/18/15 [phone]

## 2015-05-18 NOTE — Discharge Instructions (Signed)
Abdominal Pain, Women Taylor Bartlett, you blood work today is normal. Take Tylenol as needed for pain control. See gastroenterologist within 3 days for close follow-up. If symptoms worsen come back to the emergency department immediately. Thank you. Abdominal (stomach, pelvic, or belly) pain can be caused by many things. It is important to tell your doctor:  The location of the pain.  Does it come and go or is it present all the time?  Are there things that start the pain (eating certain foods, exercise)?  Are there other symptoms associated with the pain (fever, nausea, vomiting, diarrhea)? All of this is helpful to know when trying to find the cause of the pain. CAUSES   Stomach: virus or bacteria infection, or ulcer.  Intestine: appendicitis (inflamed appendix), regional ileitis (Crohn's disease), ulcerative colitis (inflamed colon), irritable bowel syndrome, diverticulitis (inflamed diverticulum of the colon), or cancer of the stomach or intestine.  Gallbladder disease or stones in the gallbladder.  Kidney disease, kidney stones, or infection.  Pancreas infection or cancer.  Fibromyalgia (pain disorder).  Diseases of the female organs:  Uterus: fibroid (non-cancerous) tumors or infection.  Fallopian tubes: infection or tubal pregnancy.  Ovary: cysts or tumors.  Pelvic adhesions (scar tissue).  Endometriosis (uterus lining tissue growing in the pelvis and on the pelvic organs).  Pelvic congestion syndrome (female organs filling up with blood just before the menstrual period).  Pain with the menstrual period.  Pain with ovulation (producing an egg).  Pain with an IUD (intrauterine device, birth control) in the uterus.  Cancer of the female organs.  Functional pain (pain not caused by a disease, may improve without treatment).  Psychological pain.  Depression. DIAGNOSIS  Your doctor will decide the seriousness of your pain by doing an examination.  Blood  tests.  X-rays.  Ultrasound.  CT scan (computed tomography, special type of X-ray).  MRI (magnetic resonance imaging).  Cultures, for infection.  Barium enema (dye inserted in the large intestine, to better view it with X-rays).  Colonoscopy (looking in intestine with a lighted tube).  Laparoscopy (minor surgery, looking in abdomen with a lighted tube).  Major abdominal exploratory surgery (looking in abdomen with a large incision). TREATMENT  The treatment will depend on the cause of the pain.   Many cases can be observed and treated at home.  Over-the-counter medicines recommended by your caregiver.  Prescription medicine.  Antibiotics, for infection.  Birth control pills, for painful periods or for ovulation pain.  Hormone treatment, for endometriosis.  Nerve blocking injections.  Physical therapy.  Antidepressants.  Counseling with a psychologist or psychiatrist.  Minor or major surgery. HOME CARE INSTRUCTIONS   Do not take laxatives, unless directed by your caregiver.  Take over-the-counter pain medicine only if ordered by your caregiver. Do not take aspirin because it can cause an upset stomach or bleeding.  Try a clear liquid diet (broth or water) as ordered by your caregiver. Slowly move to a bland diet, as tolerated, if the pain is related to the stomach or intestine.  Have a thermometer and take your temperature several times a day, and record it.  Bed rest and sleep, if it helps the pain.  Avoid sexual intercourse, if it causes pain.  Avoid stressful situations.  Keep your follow-up appointments and tests, as your caregiver orders.  If the pain does not go away with medicine or surgery, you may try:  Acupuncture.  Relaxation exercises (yoga, meditation).  Group therapy.  Counseling. SEEK MEDICAL CARE IF:  You notice certain foods cause stomach pain.  Your home care treatment is not helping your pain.  You need stronger pain  medicine.  You want your IUD removed.  You feel faint or lightheaded.  You develop nausea and vomiting.  You develop a rash.  You are having side effects or an allergy to your medicine. SEEK IMMEDIATE MEDICAL CARE IF:   Your pain does not go away or gets worse.  You have a fever.  Your pain is felt only in portions of the abdomen. The right side could possibly be appendicitis. The left lower portion of the abdomen could be colitis or diverticulitis.  You are passing blood in your stools (bright red or black tarry stools, with or without vomiting).  You have blood in your urine.  You develop chills, with or without a fever.  You pass out. MAKE SURE YOU:   Understand these instructions.  Will watch your condition.  Will get help right away if you are not doing well or get worse. Document Released: 09/05/2007 Document Revised: 03/25/2014 Document Reviewed: 09/25/2009 [facility] Patient Information 2015 [place], [place]. This information is not intended to replace advice given to you by your health care provider. Make sure you discuss any questions you have with your health care provider.

## 2015-05-19 ENCOUNTER — Encounter (HOSPITAL_COMMUNITY): Payer: Self-pay | Admitting: Family Medicine

## 2015-05-19 ENCOUNTER — Emergency Department (HOSPITAL_COMMUNITY)
Admission: EM | Admit: 2015-05-19 | Discharge: 2015-05-19 | Disposition: A | Discharge status: Home / Self Care | Payer: Self-pay | Attending: Emergency Medicine | Admitting: Emergency Medicine

## 2015-05-19 DIAGNOSIS — K219 Gastro-esophageal reflux disease without esophagitis: Secondary | ICD-10-CM | POA: Insufficient documentation

## 2015-05-19 DIAGNOSIS — R112 Nausea with vomiting, unspecified: Secondary | ICD-10-CM | POA: Insufficient documentation

## 2015-05-19 DIAGNOSIS — Z79899 Other long term (current) drug therapy: Secondary | ICD-10-CM | POA: Insufficient documentation

## 2015-05-19 DIAGNOSIS — R1013 Epigastric pain: Secondary | ICD-10-CM | POA: Insufficient documentation

## 2015-05-19 DIAGNOSIS — Z8659 Personal history of other mental and behavioral disorders: Secondary | ICD-10-CM | POA: Insufficient documentation

## 2015-05-19 DIAGNOSIS — Z720 Tobacco use: Secondary | ICD-10-CM | POA: Insufficient documentation

## 2015-05-19 DIAGNOSIS — I10 Essential (primary) hypertension: Secondary | ICD-10-CM | POA: Insufficient documentation

## 2015-05-19 DIAGNOSIS — R197 Diarrhea, unspecified: Secondary | ICD-10-CM | POA: Insufficient documentation

## 2015-05-19 DIAGNOSIS — E876 Hypokalemia: Secondary | ICD-10-CM | POA: Insufficient documentation

## 2015-05-19 LAB — COMPREHENSIVE METABOLIC PANEL
ALK PHOS: 65 U/L (ref 38–126)
ALT: 23 U/L (ref 14–54)
ANION GAP: 12 (ref 5–15)
AST: 34 U/L (ref 15–41)
Albumin: 4.8 g/dL (ref 3.5–5.0)
BUN: 9 mg/dL (ref 6–20)
CO2: 27 mmol/L (ref 22–32)
CREATININE: 0.92 mg/dL (ref 0.44–1.00)
Calcium: 10 mg/dL (ref 8.9–10.3)
Chloride: 97 mmol/L — ABNORMAL LOW (ref 101–111)
GFR calc non Af Amer: >60 mL/min (ref >60)
Glucose, Bld: 116 mg/dL — ABNORMAL HIGH (ref 65–99)
Potassium: 2.9 mmol/L — ABNORMAL LOW (ref 3.5–5.1)
Sodium: 136 mmol/L (ref 135–145)
Total Bilirubin: 0.6 mg/dL (ref 0.3–1.2)
Total Protein: 8.6 g/dL — ABNORMAL HIGH (ref 6.5–8.1)

## 2015-05-19 LAB — CBC WITH DIFFERENTIAL/PLATELET
BASOS ABS: 0 10*3/uL (ref 0.0–0.1)
Basophils Relative: 0 % (ref 0–1)
Eosinophils Absolute: 0 10*3/uL (ref 0.0–0.7)
Eosinophils Relative: 0 % (ref 0–5)
HCT: 40.3 % (ref 36.0–46.0)
Hemoglobin: 13.8 g/dL (ref 12.0–15.0)
LYMPHS PCT: 13 % (ref 12–46)
Lymphs Abs: 1.7 10*3/uL (ref 0.7–4.0)
MCH: 32.6 pg (ref 26.0–34.0)
MCHC: 34.2 g/dL (ref 30.0–36.0)
MCV: 95.3 fL (ref 78.0–100.0)
Monocytes Absolute: 0.6 10*3/uL (ref 0.1–1.0)
Monocytes Relative: 5 % (ref 3–12)
NEUTROS ABS: 10.8 10*3/uL — AB (ref 1.7–7.7)
NEUTROS PCT: 82 % — AB (ref 43–77)
Platelets: 233 10*3/uL (ref 150–400)
RBC: 4.23 MIL/uL (ref 3.87–5.11)
RDW: 12.6 % (ref 11.5–15.5)
WBC: 13.1 10*3/uL — AB (ref 4.0–10.5)

## 2015-05-19 LAB — LIPASE, BLOOD: Lipase: 20 U/L — ABNORMAL LOW (ref 22–51)

## 2015-05-19 MED ORDER — FENTANYL CITRATE (PF) 100 MCG/2ML IJ SOLN
12.5000 ug | Freq: Once | INTRAMUSCULAR | Status: AC
  Administered 2015-05-19: 12.5 ug via INTRAVENOUS
  Filled 2015-05-19: qty 2

## 2015-05-19 MED ORDER — ONDANSETRON HCL 4 MG/2ML IJ SOLN
4.0000 mg | Freq: Once | INTRAMUSCULAR | Status: AC
  Administered 2015-05-19: 4 mg via INTRAVENOUS
  Filled 2015-05-19: qty 2

## 2015-05-19 MED ORDER — POTASSIUM CHLORIDE 10 MEQ/100ML IV SOLN
10.0000 meq | Freq: Once | INTRAVENOUS | Status: AC
  Administered 2015-05-19: 10 meq via INTRAVENOUS
  Filled 2015-05-19: qty 100

## 2015-05-19 MED ORDER — GI COCKTAIL ~~LOC~~
30.0000 mL | Freq: Once | ORAL | Status: AC
Start: 2015-05-19 — End: 2015-05-19
  Administered 2015-05-19: 30 mL via ORAL
  Filled 2015-05-19: qty 30

## 2015-05-19 MED ORDER — ONDANSETRON 4 MG PO TBDP
4.0000 mg | ORAL_TABLET | Freq: Once | ORAL | Status: AC
  Administered 2015-05-19: 4 mg via ORAL
  Filled 2015-05-19: qty 1

## 2015-05-19 MED ORDER — SODIUM CHLORIDE 0.9 % IV BOLUS (SEPSIS)
1000.0000 mL | Freq: Once | INTRAVENOUS | Status: AC
  Administered 2015-05-19: 1000 mL via INTRAVENOUS

## 2015-05-19 NOTE — ED Provider Notes (Signed)
CSN: [id number]     Arrival date & time 05/19/15  1224 History   None    Chief Complaint  Patient presents with  . Nausea  . Emesis     (Consider location/radiation/quality/duration/timing/severity/associated sxs/prior Treatment) Patient is a 29 y.o. female presenting with vomiting. The history is provided by the patient. No language interpreter was used.  Emesis Taylor Bartlett is a 29 y.o female with a history of a hole abuse, hypertension, seizure, anxiety who presents for abdominal pain, nausea, several episodes of vomiting, diarrhea since yesterday morning. She states she vomited twice with blood today. She describes the pain as a tearing sensation. Nothing makes her pain better or worse. She has been evaluated for the same several times in the ED. She states she has not followed up with GI because she has not had a chance to. She was last seen in the ED on 05/17/2015 by Dr. Mora Bellman. She denies any fever, chills, chest pain, shortness of breath, dysuria, hematuria, hematochezia, vaginal discharge, vaginal bleeding. She denies any alcohol or drug use. She smokes one pack of cigarettes per day.  Past Medical History  Diagnosis Date  . Seasonal allergies   . Alcohol abuse   . Hypertension   . Reflux   . Anxiety    History reviewed. No pertinent past surgical history. History reviewed. No pertinent family history. History  Substance Use Topics  . Smoking status: Current Every Day Smoker    Types: Cigarettes  . Smokeless tobacco: Not on file  . Alcohol Use: Yes     Comment: clean 07/31/12   OB History    No data available     Review of Systems  Constitutional: Negative for fever.  Gastrointestinal: Positive for vomiting.  Genitourinary: Negative for hematuria.  Neurological: Negative for dizziness.  All other systems reviewed and are negative.     Allergies  Tomato  Home Medications   Prior to Admission medications   Medication Sig Start Date End Date Taking? Authorizing  Provider  Cholecalciferol (VITAMIN D PO) Take 3 tablets by mouth daily.   Yes Historical Provider, MD  OLANZapine (ZYPREXA) 15 MG tablet Take 15 mg by mouth at bedtime.   Yes Historical Provider, MD  omeprazole (PRILOSEC) 20 MG capsule Take 1 capsule (20 mg total) by mouth daily. 04/03/15  Yes Azalia Bilis, MD  ondansetron (ZOFRAN ODT) 4 MG disintegrating tablet Take 1 tablet (4 mg total) by mouth every 8 (eight) hours as needed for nausea or vomiting. 05/18/15  Yes [name], MD  OVER THE COUNTER MEDICATION Take 1 tablet by mouth. Take thirty minutes before eating for acid reflux   Yes Historical Provider, MD  promethazine (PHENERGAN) 25 MG suppository Place 1 suppository (25 mg total) rectally every 6 (six) hours as needed for nausea or vomiting. 04/03/15   Azalia Bilis, MD  sucralfate (CARAFATE) 1 G tablet Take 1 tablet (1 g total) by mouth 4 (four) times daily -  with meals and at bedtime. 04/03/15   Azalia Bilis, MD   BP 120/67 mmHg  Pulse 76  Temp(Src) 97.8 F (36.6 C)  Resp 16  SpO2 100%  LMP 04/16/2015 Physical Exam  Constitutional: She is oriented to person, place, and time. She appears well-developed and well-nourished.  HENT:  Head: Normocephalic and atraumatic.  Eyes: Conjunctivae are normal.  Neck: Normal range of motion. Neck supple.  Cardiovascular: Normal rate, regular rhythm and normal heart sounds.   Pulmonary/Chest: Effort normal and breath sounds normal.  Abdominal: Soft. Normal  appearance. She exhibits no distension and no mass. There is no splenomegaly or hepatomegaly. There is tenderness in the epigastric area. There is no rigidity, no rebound, no guarding and no CVA tenderness.    Musculoskeletal: Normal range of motion.  Neurological: She is alert and oriented to person, place, and time.  Skin: Skin is warm and dry.  Psychiatric: She has a normal mood and affect. Her behavior is normal.  Nursing note and vitals reviewed.   ED Course  Procedures (including  critical care time) Labs Review Labs Reviewed  CBC WITH DIFFERENTIAL/PLATELET - Abnormal; Notable for the following:    WBC 13.1 (*)    Neutrophils Relative % 82 (*)    Neutro Abs 10.8 (*)    All other components within normal limits  COMPREHENSIVE METABOLIC PANEL - Abnormal; Notable for the following:    Potassium 2.9 (*)    Chloride 97 (*)    Glucose, Bld 116 (*)    Total Protein 8.6 (*)    All other components within normal limits  LIPASE, BLOOD - Abnormal; Notable for the following:    Lipase 20 (*)    All other components within normal limits    Imaging Review No results found.   EKG Interpretation None      MDM   Final diagnoses:  Nausea vomiting and diarrhea  Epigastric pain  Patient is well-appearing and in no acute distress. Vitals are stable. She is hypokalemic. I will hydrate, give Zofran, give potassium She was seen by Dr. Mora Bellman on 05/17/2015. She had a negative pregnancy and normal labs at that time. Recheck: Patient states she feels no better after GI cocktail was given morphine yesterday for pain. Medications  ondansetron (ZOFRAN-ODT) disintegrating tablet 4 mg (4 mg Oral Given 05/19/15 1516)  potassium chloride 10 mEq in 100 mL IVPB (0 mEq Intravenous Stopped 05/19/15 1650)  sodium chloride 0.9 % bolus 1,000 mL (0 mLs Intravenous Stopped 05/19/15 1650)  gi cocktail (Maalox,Lidocaine,Donnatal) (30 mLs Oral Given 05/19/15 1521)  fentaNYL (SUBLIMAZE) injection 12.5 mcg (12.5 mcg Intravenous Given 05/19/15 1653)  ondansetron (ZOFRAN) injection 4 mg (4 mg Intravenous Given 05/19/15 1729)  fentaNYL (SUBLIMAZE) injection 12.5 mcg (12.5 mcg Intravenous Given 05/19/15 1729)   Patient requesting morphine but I explained that I did not have findings or reason to give her this narcotic. I discussed following up with GI. Patient agrees with the plan.    Catha Gosselin, PA-C 05/20/15 7425  Pricilla Loveless, MD 05/20/15 1451

## 2015-05-19 NOTE — ED Notes (Signed)
Pt here for continued N,V was seen here Saturday for the same and referred to GI doctor. sts abd pain

## 2015-05-19 NOTE — Discharge Instructions (Signed)
Abdominal Pain Follow up with gastroenterology. Many things can cause abdominal pain. Usually, abdominal pain is not caused by a disease and will improve without treatment. It can often be observed and treated at home. Your health care provider will do a physical exam and possibly order blood tests and X-rays to help determine the seriousness of your pain. However, in many cases, more time must pass before a clear cause of the pain can be found. Before that point, your health care provider may not know if you need more testing or further treatment. HOME CARE INSTRUCTIONS  Monitor your abdominal pain for any changes. The following actions may help to alleviate any discomfort you are experiencing:  Only take over-the-counter or prescription medicines as directed by your health care provider.  Do not take laxatives unless directed to do so by your health care provider.  Try a clear liquid diet (broth, tea, or water) as directed by your health care provider. Slowly move to a bland diet as tolerated. SEEK MEDICAL CARE IF:  You have unexplained abdominal pain.  You have abdominal pain associated with nausea or diarrhea.  You have pain when you urinate or have a bowel movement.  You experience abdominal pain that wakes you in the night.  You have abdominal pain that is worsened or improved by eating food.  You have abdominal pain that is worsened with eating fatty foods.  You have a fever. SEEK IMMEDIATE MEDICAL CARE IF:   Your pain does not go away within 2 hours.  You keep throwing up (vomiting). Nausea and Vomiting Nausea means you feel sick to your stomach. Throwing up (vomiting) is a reflex where stomach contents come out of your mouth. HOME CARE  Take medicine as told by your doctor. Do not force yourself to eat. However, you do need to drink fluids. If you feel like eating, eat a normal diet as told by your doctor. Eat rice, wheat, potatoes, bread, lean meats, yogurt, fruits, and  vegetables. Avoid high-fat foods. Drink enough fluids to keep your pee (urine) clear or pale yellow. Ask your doctor how to replace body fluid losses (rehydrate). Signs of body fluid loss (dehydration) include: Feeling very thirsty. Dry lips and mouth. Feeling dizzy. Dark pee. Peeing less than normal. Feeling confused. Fast breathing or heart rate. GET HELP RIGHT AWAY IF:  You have blood in your throw up. You have black or bloody poop (stool). You have a bad headache or stiff neck. You feel confused. You have bad belly (abdominal) pain. You have chest pain or trouble breathing. You do not pee at least once every 8 hours. You have cold, clammy skin. You keep throwing up after 24 to 48 hours. You have a fever. MAKE SURE YOU:  Understand these instructions. Will watch your condition. Will get help right away if you are not doing well or get worse. Document Released: 04/26/2008 Document Revised: 01/31/2012 Document Reviewed: 04/09/2011 [facility] Patient Information 2015 [place], [place]. This information is not intended to replace advice given to you by your health care provider. Make sure you discuss any questions you have with your health care provider.   Your pain is felt only in portions of the abdomen, such as the right side or the left lower portion of the abdomen.  You pass bloody or black tarry stools. MAKE SURE YOU:  Understand these instructions.   Will watch your condition.   Will get help right away if you are not doing well or get worse.  Document  Released: 08/18/2005 Document Revised: 11/13/2013 Document Reviewed: 07/18/2013 [facility] Patient Information 2015 [place], [place]. This information is not intended to replace advice given to you by your health care provider. Make sure you discuss any questions you have with your health care provider.

## 2015-05-19 NOTE — ED Notes (Signed)
Pt expressed that she was still in pain and needed something else.  I informed Hannah-PA

## 2015-05-21 ENCOUNTER — Encounter (HOSPITAL_COMMUNITY): Payer: Self-pay | Admitting: Emergency Medicine

## 2015-05-21 ENCOUNTER — Emergency Department (INDEPENDENT_AMBULATORY_CARE_PROVIDER_SITE_OTHER)
Admission: EM | Admit: 2015-05-21 | Discharge: 2015-05-21 | Disposition: A | Discharge status: Nursing Home (Includes ICF) | Payer: Self-pay | Source: Home / Self Care | Attending: Emergency Medicine | Admitting: Emergency Medicine

## 2015-05-21 ENCOUNTER — Emergency Department (HOSPITAL_COMMUNITY)
Admission: EM | Admit: 2015-05-21 | Discharge: 2015-05-21 | Disposition: A | Discharge status: Home / Self Care | Payer: Self-pay | Attending: Emergency Medicine | Admitting: Emergency Medicine

## 2015-05-21 ENCOUNTER — Encounter (HOSPITAL_COMMUNITY): Payer: Self-pay | Admitting: *Deleted

## 2015-05-21 DIAGNOSIS — R1084 Generalized abdominal pain: Secondary | ICD-10-CM

## 2015-05-21 DIAGNOSIS — Z720 Tobacco use: Secondary | ICD-10-CM | POA: Insufficient documentation

## 2015-05-21 DIAGNOSIS — G43A1 Cyclical vomiting, intractable: Secondary | ICD-10-CM

## 2015-05-21 DIAGNOSIS — Z79899 Other long term (current) drug therapy: Secondary | ICD-10-CM | POA: Insufficient documentation

## 2015-05-21 DIAGNOSIS — Z3202 Encounter for pregnancy test, result negative: Secondary | ICD-10-CM | POA: Insufficient documentation

## 2015-05-21 DIAGNOSIS — R1110 Vomiting, unspecified: Secondary | ICD-10-CM

## 2015-05-21 DIAGNOSIS — K219 Gastro-esophageal reflux disease without esophagitis: Secondary | ICD-10-CM | POA: Insufficient documentation

## 2015-05-21 DIAGNOSIS — R1115 Cyclical vomiting syndrome unrelated to migraine: Secondary | ICD-10-CM

## 2015-05-21 DIAGNOSIS — G43A0 Cyclical vomiting, not intractable: Secondary | ICD-10-CM | POA: Insufficient documentation

## 2015-05-21 DIAGNOSIS — Z8659 Personal history of other mental and behavioral disorders: Secondary | ICD-10-CM | POA: Insufficient documentation

## 2015-05-21 DIAGNOSIS — I10 Essential (primary) hypertension: Secondary | ICD-10-CM | POA: Insufficient documentation

## 2015-05-21 LAB — CBC WITH DIFFERENTIAL/PLATELET
Basophils Absolute: 0 10*3/uL (ref 0.0–0.1)
Basophils Relative: 0 % (ref 0–1)
EOS ABS: 0 10*3/uL (ref 0.0–0.7)
EOS PCT: 0 % (ref 0–5)
HEMATOCRIT: 39.9 % (ref 36.0–46.0)
HEMOGLOBIN: 13.6 g/dL (ref 12.0–15.0)
LYMPHS ABS: 1 10*3/uL (ref 0.7–4.0)
LYMPHS PCT: 9 % — AB (ref 12–46)
MCH: 32.8 pg (ref 26.0–34.0)
MCHC: 34.1 g/dL (ref 30.0–36.0)
MCV: 96.1 fL (ref 78.0–100.0)
MONOS PCT: 3 % (ref 3–12)
Monocytes Absolute: 0.3 10*3/uL (ref 0.1–1.0)
Neutro Abs: 9.9 10*3/uL — ABNORMAL HIGH (ref 1.7–7.7)
Neutrophils Relative %: 88 % — ABNORMAL HIGH (ref 43–77)
PLATELETS: 227 10*3/uL (ref 150–400)
RBC: 4.15 MIL/uL (ref 3.87–5.11)
RDW: 12.4 % (ref 11.5–15.5)
WBC: 11.2 10*3/uL — ABNORMAL HIGH (ref 4.0–10.5)

## 2015-05-21 LAB — LIPASE, BLOOD: LIPASE: 18 U/L — AB (ref 22–51)

## 2015-05-21 LAB — URINE MICROSCOPIC-ADD ON

## 2015-05-21 LAB — URINALYSIS, ROUTINE W REFLEX MICROSCOPIC
Bilirubin Urine: NEGATIVE
GLUCOSE, UA: NEGATIVE mg/dL
Ketones, ur: NEGATIVE mg/dL
Nitrite: NEGATIVE
Protein, ur: NEGATIVE mg/dL
Specific Gravity, Urine: 1.017 (ref 1.005–1.030)
Urobilinogen, UA: 1 mg/dL (ref 0.0–1.0)
pH: 7.5 (ref 5.0–8.0)

## 2015-05-21 LAB — COMPREHENSIVE METABOLIC PANEL
ALT: 20 U/L (ref 14–54)
ANION GAP: 10 (ref 5–15)
AST: 26 U/L (ref 15–41)
Albumin: 4.6 g/dL (ref 3.5–5.0)
Alkaline Phosphatase: 58 U/L (ref 38–126)
BUN: 6 mg/dL (ref 6–20)
CALCIUM: 9.9 mg/dL (ref 8.9–10.3)
CHLORIDE: 101 mmol/L (ref 101–111)
CO2: 27 mmol/L (ref 22–32)
Creatinine, Ser: 0.84 mg/dL (ref 0.44–1.00)
GFR calc Af Amer: >60 mL/min (ref >60)
GFR calc non Af Amer: >60 mL/min (ref >60)
Glucose, Bld: 134 mg/dL — ABNORMAL HIGH (ref 65–99)
Potassium: 3.4 mmol/L — ABNORMAL LOW (ref 3.5–5.1)
SODIUM: 138 mmol/L (ref 135–145)
Total Bilirubin: 0.5 mg/dL (ref 0.3–1.2)
Total Protein: 8.6 g/dL — ABNORMAL HIGH (ref 6.5–8.1)

## 2015-05-21 LAB — POC URINE PREG, ED: PREG TEST UR: NEGATIVE

## 2015-05-21 MED ORDER — DIPHENHYDRAMINE HCL 50 MG/ML IJ SOLN
25.0000 mg | Freq: Once | INTRAMUSCULAR | Status: AC
  Administered 2015-05-21: 25 mg via INTRAVENOUS
  Filled 2015-05-21: qty 1

## 2015-05-21 MED ORDER — LORAZEPAM 2 MG/ML IJ SOLN
1.0000 mg | Freq: Once | INTRAMUSCULAR | Status: AC
  Administered 2015-05-21: 1 mg via INTRAVENOUS
  Filled 2015-05-21: qty 1

## 2015-05-21 MED ORDER — METOCLOPRAMIDE HCL 10 MG PO TABS: 10.0000 mg | ORAL_TABLET | Freq: Four times a day (QID) | ORAL | Status: DC | PRN

## 2015-05-21 MED ORDER — DEXAMETHASONE SODIUM PHOSPHATE 10 MG/ML IJ SOLN
10.0000 mg | Freq: Once | INTRAMUSCULAR | Status: AC
  Administered 2015-05-21: 10 mg via INTRAVENOUS
  Filled 2015-05-21: qty 1

## 2015-05-21 MED ORDER — SODIUM CHLORIDE 0.9 % IV BOLUS (SEPSIS)
1000.0000 mL | Freq: Once | INTRAVENOUS | Status: AC
  Administered 2015-05-21: 1000 mL via INTRAVENOUS

## 2015-05-21 MED ORDER — HALOPERIDOL LACTATE 5 MG/ML IJ SOLN
2.0000 mg | Freq: Once | INTRAMUSCULAR | Status: AC
  Administered 2015-05-21: 2 mg via INTRAVENOUS
  Filled 2015-05-21: qty 1

## 2015-05-21 NOTE — Discharge Instructions (Signed)
Cyclic Vomiting Syndrome °Cyclic vomiting syndrome is a benign condition in which patients experience bouts or cycles of severe nausea and vomiting that last for hours or even days. The bouts of nausea and vomiting alternate with longer periods of no symptoms and generally good health. Cyclic vomiting syndrome occurs mostly in children, but can affect adults. °CAUSES  °[facility] has no known cause. Each episode is typically similar to the previous ones. The episodes tend to:  °· Start at about the same time of day. °· Last the same length of time. °· Present the same symptoms at the same level of intensity. °Cyclic vomiting syndrome can begin at any age in children and adults. Cyclic vomiting syndrome usually starts between the ages of 3 and 7 years. In adults, episodes tend to occur less often than they do in children, but they last longer. Furthermore, the events or situations that trigger episodes in adults cannot always be pinpointed as easily as they can in children. °There are 4 phases of cyclic vomiting syndrome: °1. Prodrome. The prodrome phase signals that an episode of nausea and vomiting is about to begin. This phase can last from just a few minutes to several hours. This phase is often marked by belly (abdominal) pain. Sometimes taking medicine early in the prodrome phase can stop an episode in progress. However, sometimes there is no warning. A person may simply wake up in the middle of the night or early morning and begin vomiting. °2. Episode. The episode phase consists of: °· Severe vomiting. °· Nausea. °· Gagging (retching). °3. Recovery. The recovery phase begins when the nausea and vomiting stop. Healthy color, appetite, and energy return. °4. Symptom-free interval. The symptom-free interval phase is the period between episodes when no symptoms are present. °TRIGGERS °Episodes can be triggered by an infection or event. Examples of triggers include: °· Infections. °· Colds, allergies, sinus problems, and  the flu. °· Eating certain foods such as chocolate or cheese. °· Foods with monosodium glutamate (MSG) or preservatives. °· Fast foods. °· Pre-packaged foods. °· Foods with low nutritional value (junk foods). °· Overeating. °· Eating just before going to bed. °· Hot weather. °· Dehydration. °· Not enough sleep or poor sleep quality. °· Physical exhaustion. °· Menstruation. °· Motion sickness. °· Emotional stress (school or home difficulties). °· Excitement or stress. °SYMPTOMS  °The main symptoms of cyclic vomiting syndrome are: °· Severe vomiting. °· Nausea. °· Gagging (retching). °Episodes usually begin at night or the first thing in the morning. Episodes may include vomiting or retching up to 5 or 6 times an hour during the worst of the episode. Episodes usually last anywhere from 1 to 4 days. Episodes can last for up to 10 days. Other symptoms include: °· Paleness. °· Exhaustion. °· Listlessness. °· Abdominal pain. °· Loose stools or diarrhea. °Sometimes the nausea and vomiting are so severe that a person appears to be almost unconscious. Sensitivity to light, headache, fever, dizziness, may also accompany an episode. In addition, the vomiting may cause drooling and excessive thirst. Drinking water usually leads to more vomiting, though the water can dilute the acid in the vomit, making the episode a little less painful. Continuous vomiting can lead to dehydration, which means that the body has lost excessive water and salts. °DIAGNOSIS  °Cyclic vomiting syndrome is hard to diagnose because there are no clear tests to identify it. A caregiver must diagnose cyclic vomiting syndrome by looking at symptoms and medical history. A caregiver must exclude more common diseases   or disorders that can also cause nausea and vomiting. Also, diagnosis takes time because caregivers need to identify a pattern or cycle to the vomiting. °TREATMENT  °Cyclic vomiting syndrome cannot be cured. Treatment varies, but people with  cyclic vomiting syndrome should get plenty of rest and sleep and take medications that prevent, stop, or lessen the vomiting episodes and other symptoms. °People whose episodes are frequent and long-lasting may be treated during the symptom-free intervals in an effort to prevent or ease future episodes. The symptom-free phase is a good time to eliminate anything known to trigger an episode. For example, if episodes are brought on by stress or excitement, this period is the time to find ways to reduce stress and stay calm. If sinus problems or allergies cause episodes, those conditions should be treated. The triggers listed above should be avoided or prevented. °Because of the similarities between migraine and cyclic vomiting syndrome, caregivers treat some people with severe cyclic vomiting syndrome with drugs that are also used for migraine headaches. The drugs are designed to: °· Prevent episodes. °· Reduce their frequency. °· Lessen their severity. °HOME CARE INSTRUCTIONS °Once a vomiting episode begins, treatment is supportive. It helps to stay in bed and sleep in a dark, quiet room. Severe nausea and vomiting may require hospitalization and intravenous (IV) fluids to prevent dehydration. Relaxing medications (sedatives) may help if the nausea continues. Sometimes, during the prodrome phase, it is possible to stop an episode from happening altogether. Only take over-the-counter or prescription medicines for pain, discomfort or fever as directed by your caregiver. Do not give aspirin to children. °During the recovery phase, drinking water and replacing lost electrolytes (salts in the blood) are very important. Electrolytes are salts that the body needs to function well and stay healthy. Symptoms during the recovery phase can vary. Some people find that their appetites return to normal immediately, while others need to begin by drinking clear liquids and then move slowly to solid food. °RELATED COMPLICATIONS °The  severe vomiting that defines cyclic vomiting syndrome is a risk factor for several complications: °· Dehydration--Vomiting causes the body to lose water quickly. °· Electrolyte imbalance--Vomiting also causes the body to lose the important salts it needs to keep working properly. °· Peptic esophagitis--The tube that connects the mouth to the stomach (esophagus) becomes injured from the stomach acid that comes up with the vomit. °· Hematemesis--The esophagus becomes irritated and bleeds, so blood mixes with the vomit. °· Mallory-Weiss tear--The lower end of the esophagus may tear open or the stomach may bruise from vomiting or retching. °· Tooth decay--The acid in the vomit can hurt the teeth by corroding the tooth enamel. °SEEK MEDICAL CARE IF: °You have questions or problems. °Document Released: 01/17/2002 Document Revised: 01/31/2012 Document Reviewed: 02/15/2011 °[employer]® Patient Information ©2015 [employer]. This information is not intended to replace advice given to you by your health care provider. Make sure you discuss any questions you have with your health care provider. ° °

## 2015-05-21 NOTE — ED Provider Notes (Signed)
CSN: [id number]     Arrival date & time 05/21/15  1527 History   First MD Initiated Contact with Patient 05/21/15 1711     Chief Complaint  Patient presents with  . Abdominal Pain  . Emesis     (Consider location/radiation/quality/duration/timing/severity/associated sxs/prior Treatment) HPI Comments: Patient presents to the emergency department for evaluation of abdominal pain with nausea and vomiting. Patient does report a history of chronic recurrent episodes of abdominal pain with nausea and vomiting similar to this. This is her third visit to the emergency department suite. Patient complaining of severe cramping in the upper abdomen associated with vomiting, cannot hold anything down.  Patient is a 29 y.o. female presenting with abdominal pain and vomiting.  Abdominal Pain Associated symptoms: nausea and vomiting   Emesis Associated symptoms: abdominal pain     Past Medical History  Diagnosis Date  . Seasonal allergies   . Alcohol abuse   . Hypertension   . Reflux   . Anxiety    History reviewed. No pertinent past surgical history. History reviewed. No pertinent family history. History  Substance Use Topics  . Smoking status: Current Every Day Smoker    Types: Cigarettes  . Smokeless tobacco: Not on file  . Alcohol Use: Yes     Comment: clean 07/31/12   OB History    No data available     Review of Systems  Gastrointestinal: Positive for nausea, vomiting and abdominal pain.  All other systems reviewed and are negative.     Allergies  Tomato  Home Medications   Prior to Admission medications   Medication Sig Start Date End Date Taking? Authorizing Provider  Cholecalciferol (VITAMIN D PO) Take 3 tablets by mouth daily.   Yes Historical Provider, MD  OLANZapine (ZYPREXA) 15 MG tablet Take 15 mg by mouth at bedtime.   Yes Historical Provider, MD  omeprazole (PRILOSEC) 20 MG capsule Take 1 capsule (20 mg total) by mouth daily. 04/03/15  Yes Azalia Bilis, MD   ondansetron (ZOFRAN ODT) 4 MG disintegrating tablet Take 1 tablet (4 mg total) by mouth every 8 (eight) hours as needed for nausea or vomiting. 05/18/15  Yes [name], MD  OVER THE COUNTER MEDICATION Take 1 tablet by mouth. Take thirty minutes before eating for acid reflux   Yes Historical Provider, MD  promethazine (PHENERGAN) 25 MG suppository Place 1 suppository (25 mg total) rectally every 6 (six) hours as needed for nausea or vomiting. 04/03/15  Yes Azalia Bilis, MD  sucralfate (CARAFATE) 1 G tablet Take 1 tablet (1 g total) by mouth 4 (four) times daily -  with meals and at bedtime. 04/03/15  Yes Azalia Bilis, MD  metoCLOPramide (REGLAN) 10 MG tablet Take 1 tablet (10 mg total) by mouth every 6 (six) hours as needed for nausea (nausea/headache). 05/21/15   Gilda Crease, MD   BP 153/100 mmHg  Pulse 84  Temp(Src) 97.8 F (36.6 C) (Oral)  Resp 18  SpO2 100%  LMP 04/16/2015 Physical Exam  Constitutional: She is oriented to person, place, and time. She appears well-developed and well-nourished. No distress.  HENT:  Head: Normocephalic and atraumatic.  Right Ear: Hearing normal.  Left Ear: Hearing normal.  Nose: Nose normal.  Mouth/Throat: Oropharynx is clear and moist and mucous membranes are normal.  Eyes: Conjunctivae and EOM are normal. Pupils are equal, round, and reactive to light.  Neck: Normal range of motion. Neck supple.  Cardiovascular: Regular rhythm, S1 normal and S2 normal.  Exam reveals no  gallop and no friction rub.   No murmur heard. Pulmonary/Chest: Effort normal and breath sounds normal. No respiratory distress. She exhibits no tenderness.  Abdominal: Soft. Normal appearance and bowel sounds are normal. There is no hepatosplenomegaly. There is generalized tenderness. There is no rebound, no guarding, no tenderness at McBurney's point and negative Murphy's sign. No hernia.  Musculoskeletal: Normal range of motion.  Neurological: She is alert and oriented to  person, place, and time. She has normal strength. No cranial nerve deficit or sensory deficit. Coordination normal. GCS eye subscore is 4. GCS verbal subscore is 5. GCS motor subscore is 6.  Skin: Skin is warm, dry and intact. No rash noted. No cyanosis.  Psychiatric: She has a normal mood and affect. Her speech is normal and behavior is normal. Thought content normal.  Nursing note and vitals reviewed.   ED Course  Procedures (including critical care time) Labs Review Labs Reviewed  CBC WITH DIFFERENTIAL/PLATELET - Abnormal; Notable for the following:    WBC 11.2 (*)    Neutrophils Relative % 88 (*)    Neutro Abs 9.9 (*)    Lymphocytes Relative 9 (*)    All other components within normal limits  COMPREHENSIVE METABOLIC PANEL - Abnormal; Notable for the following:    Potassium 3.4 (*)    Glucose, Bld 134 (*)    Total Protein 8.6 (*)    All other components within normal limits  LIPASE, BLOOD - Abnormal; Notable for the following:    Lipase 18 (*)    All other components within normal limits  URINALYSIS, ROUTINE W REFLEX MICROSCOPIC (NOT AT [facility]) - Abnormal; Notable for the following:    APPearance HAZY (*)    Hgb urine dipstick LARGE (*)    Leukocytes, UA SMALL (*)    All other components within normal limits  URINE MICROSCOPIC-ADD ON - Abnormal; Notable for the following:    Squamous Epithelial / LPF FEW (*)    Bacteria, UA FEW (*)    All other components within normal limits  POC URINE PREG, ED    Imaging Review No results found.   EKG Interpretation None      MDM   Final diagnoses:  Non-intractable cyclical vomiting with nausea    Patient appears to have recurrent cyclic vomiting. She has been seen several times for this this week. She has a history of recurrent symptoms. Patient improved with fluids and aggressive treatment.    Gilda Creasehristopher J Shantasia Hunnell, MD 05/22/15 1550

## 2015-05-21 NOTE — ED Notes (Signed)
Patient intermittently retching, minimal vomitus

## 2015-05-21 NOTE — ED Provider Notes (Signed)
CSN: [id number]     Arrival date & time 05/21/15  1315 History   First MD Initiated Contact with Patient 05/21/15 1421     Chief Complaint  Patient presents with  . Emesis   (Consider location/radiation/quality/duration/timing/severity/associated sxs/prior Treatment) HPI Comments: 29 year old female presents to the urgent care with nausea, vomiting and severe diffuse abdominal pain. She has been seen in the emergency department for this twice in the past for days. She has not improved any. She is unable to take her medications due to nausea or vomiting and she claims who have had very poor by mouth intake. She states that after previous referrals to see GI she called today to see if she could get an appointment and she is waiting for a call back. She cannot recall the name of the gastroenterologist. She was sitting up on the bed side when I first entered the room and then after lying supine for examination she began to "shake" all over. When asked why she was shaking she said her stomach pain makes her nerves bad. Denies problems with breathing, shortness of breath, cough, chest pain or fevers.  Patient is a 29 y.o. female presenting with vomiting.  Emesis Associated symptoms: abdominal pain and diarrhea     Past Medical History  Diagnosis Date  . Seasonal allergies   . Alcohol abuse   . Hypertension   . Reflux   . Anxiety    History reviewed. No pertinent past surgical history. No family history on file. History  Substance Use Topics  . Smoking status: Current Every Day Smoker    Types: Cigarettes  . Smokeless tobacco: Not on file  . Alcohol Use: Yes     Comment: clean 07/31/12   OB History    No data available     Review of Systems  Constitutional: Positive for activity change and fatigue. Negative for fever.  HENT: Negative.   Respiratory: Negative for cough and shortness of breath.   Gastrointestinal: Positive for vomiting, abdominal pain and diarrhea.  Genitourinary:  Negative.   Skin: Negative.   Neurological: Negative for facial asymmetry, speech difficulty and weakness.    Allergies  Tomato  Home Medications   Prior to Admission medications   Medication Sig Start Date End Date Taking? Authorizing Provider  Cholecalciferol (VITAMIN D PO) Take 3 tablets by mouth daily.    Historical Provider, MD  OLANZapine (ZYPREXA) 15 MG tablet Take 15 mg by mouth at bedtime.    Historical Provider, MD  omeprazole (PRILOSEC) 20 MG capsule Take 1 capsule (20 mg total) by mouth daily. 04/03/15   Azalia Bilis, MD  ondansetron (ZOFRAN ODT) 4 MG disintegrating tablet Take 1 tablet (4 mg total) by mouth every 8 (eight) hours as needed for nausea or vomiting. 05/18/15   [name], MD  OVER THE COUNTER MEDICATION Take 1 tablet by mouth. Take thirty minutes before eating for acid reflux    Historical Provider, MD  promethazine (PHENERGAN) 25 MG suppository Place 1 suppository (25 mg total) rectally every 6 (six) hours as needed for nausea or vomiting. 04/03/15   Azalia Bilis, MD  sucralfate (CARAFATE) 1 G tablet Take 1 tablet (1 g total) by mouth 4 (four) times daily -  with meals and at bedtime. 04/03/15   Azalia Bilis, MD   BP 164/110 mmHg  Pulse 89  Temp(Src) 98.6 F (37 C) (Oral)  Resp 22  SpO2 100%  LMP 04/16/2015 Physical Exam  Constitutional: She is oriented to person, place, and time.  She appears well-developed and well-nourished. No distress.  The patient has been in the room retching loudly but there is no evidence of actual vomiting.  Eyes: Conjunctivae and EOM are normal.  Neck: Normal range of motion. Neck supple.  Cardiovascular: Normal rate, regular rhythm and normal heart sounds.   Pulmonary/Chest: Breath sounds normal. No respiratory distress. She has no wheezes.  Abdominal: Bowel sounds are normal.  Generalized diffuse tenderness. No palpable masses.  Musculoskeletal: She exhibits no edema.  Neurological: She is alert and oriented to person, place,  and time. She exhibits normal muscle tone.  Skin: Skin is warm and dry.  Psychiatric: She has a normal mood and affect.  Nursing note and vitals reviewed.   ED Course  Procedures (including critical care time) Labs Review Labs Reviewed - No data to display  Imaging Review No results found.   MDM   1. Generalized abdominal pain   2. Intractable vomiting with nausea, vomiting of unspecified type   3. Intractable cyclical vomiting with nausea    Although this patient has been evaluated in emergency department twice in the past 4 days she currently is retching loudly, tossing and turning in bed with "severe" abdominal pain, unable to take her medications or to hold any amount of liquids down. She will need more care than we can provide in the urgent care. She will be transferred to the emergency department via shuttle.    Hayden Rasmussenavid Hannalee Castor, NP 05/21/15 1513

## 2015-05-21 NOTE — ED Notes (Signed)
Pt reports abd pain and n/v/d. Has been seen here twice for same.

## 2015-05-22 NOTE — ED Notes (Signed)
1mg  of Ativan and 2mg  of Haldol wasted in sharps with Tonette LedererWoody Munnet, RN

## 2015-09-20 ENCOUNTER — Emergency Department (HOSPITAL_COMMUNITY): Payer: Self-pay

## 2015-09-20 ENCOUNTER — Emergency Department (HOSPITAL_COMMUNITY)
Admission: EM | Admit: 2015-09-20 | Discharge: 2015-09-20 | Disposition: A | Discharge status: Home / Self Care | Payer: Self-pay | Attending: Emergency Medicine | Admitting: Emergency Medicine

## 2015-09-20 ENCOUNTER — Encounter (HOSPITAL_COMMUNITY): Payer: Self-pay | Admitting: Emergency Medicine

## 2015-09-20 DIAGNOSIS — R112 Nausea with vomiting, unspecified: Secondary | ICD-10-CM | POA: Insufficient documentation

## 2015-09-20 DIAGNOSIS — E860 Dehydration: Secondary | ICD-10-CM | POA: Insufficient documentation

## 2015-09-20 DIAGNOSIS — I10 Essential (primary) hypertension: Secondary | ICD-10-CM | POA: Insufficient documentation

## 2015-09-20 DIAGNOSIS — R1013 Epigastric pain: Secondary | ICD-10-CM | POA: Insufficient documentation

## 2015-09-20 DIAGNOSIS — Z720 Tobacco use: Secondary | ICD-10-CM | POA: Insufficient documentation

## 2015-09-20 DIAGNOSIS — K219 Gastro-esophageal reflux disease without esophagitis: Secondary | ICD-10-CM | POA: Insufficient documentation

## 2015-09-20 DIAGNOSIS — F419 Anxiety disorder, unspecified: Secondary | ICD-10-CM | POA: Insufficient documentation

## 2015-09-20 DIAGNOSIS — R000 Tachycardia, unspecified: Secondary | ICD-10-CM | POA: Insufficient documentation

## 2015-09-20 DIAGNOSIS — R61 Generalized hyperhidrosis: Secondary | ICD-10-CM | POA: Insufficient documentation

## 2015-09-20 DIAGNOSIS — Z79899 Other long term (current) drug therapy: Secondary | ICD-10-CM | POA: Insufficient documentation

## 2015-09-20 LAB — CBC WITH DIFFERENTIAL/PLATELET
BASOS ABS: 0 10*3/uL (ref 0.0–0.1)
BASOS PCT: 0 %
EOS ABS: 0.1 10*3/uL (ref 0.0–0.7)
EOS PCT: 1 %
HCT: 39.9 % (ref 36.0–46.0)
Hemoglobin: 13.7 g/dL (ref 12.0–15.0)
LYMPHS PCT: 17 %
Lymphs Abs: 1.7 10*3/uL (ref 0.7–4.0)
MCH: 32.9 pg (ref 26.0–34.0)
MCHC: 34.3 g/dL (ref 30.0–36.0)
MCV: 95.9 fL (ref 78.0–100.0)
Monocytes Absolute: 0.5 10*3/uL (ref 0.1–1.0)
Monocytes Relative: 5 %
Neutro Abs: 7.8 10*3/uL — ABNORMAL HIGH (ref 1.7–7.7)
Neutrophils Relative %: 77 %
PLATELETS: 232 10*3/uL (ref 150–400)
RBC: 4.16 MIL/uL (ref 3.87–5.11)
RDW: 12.7 % (ref 11.5–15.5)
WBC: 10.1 10*3/uL (ref 4.0–10.5)

## 2015-09-20 LAB — COMPREHENSIVE METABOLIC PANEL
ALBUMIN: 4.5 g/dL (ref 3.5–5.0)
ALT: 23 U/L (ref 14–54)
AST: 38 U/L (ref 15–41)
Alkaline Phosphatase: 73 U/L (ref 38–126)
Anion gap: 15 (ref 5–15)
CO2: 24 mmol/L (ref 22–32)
CREATININE: 0.92 mg/dL (ref 0.44–1.00)
Calcium: 10.2 mg/dL (ref 8.9–10.3)
Chloride: 100 mmol/L — ABNORMAL LOW (ref 101–111)
GFR calc Af Amer: >60 mL/min (ref >60)
GFR calc non Af Amer: >60 mL/min (ref >60)
GLUCOSE: 115 mg/dL — AB (ref 65–99)
POTASSIUM: 3.6 mmol/L (ref 3.5–5.1)
SODIUM: 139 mmol/L (ref 135–145)
Total Bilirubin: 0.3 mg/dL (ref 0.3–1.2)
Total Protein: 8.3 g/dL — ABNORMAL HIGH (ref 6.5–8.1)

## 2015-09-20 LAB — URINE MICROSCOPIC-ADD ON

## 2015-09-20 LAB — LIPASE, BLOOD: Lipase: 27 U/L (ref 11–51)

## 2015-09-20 LAB — I-STAT CG4 LACTIC ACID, ED
Lactic Acid, Venous: 3.41 mmol/L (ref 0.5–2.0)
Lactic Acid, Venous: 1.25 mmol/L (ref 0.5–2.0)
Lactic Acid, Venous: 1.08 mmol/L (ref 0.5–2.0)

## 2015-09-20 LAB — URINALYSIS, ROUTINE W REFLEX MICROSCOPIC
Bilirubin Urine: NEGATIVE
GLUCOSE, UA: NEGATIVE mg/dL
Ketones, ur: 15 mg/dL — AB
LEUKOCYTES UA: NEGATIVE
Nitrite: NEGATIVE
PH: 8 (ref 5.0–8.0)
Protein, ur: 30 mg/dL — AB
Specific Gravity, Urine: 1.015 (ref 1.005–1.030)
Urobilinogen, UA: 1 mg/dL (ref 0.0–1.0)

## 2015-09-20 LAB — I-STAT BETA HCG BLOOD, ED (MC, WL, AP ONLY): I-stat hCG, quantitative: <5 m[IU]/mL (ref <5)

## 2015-09-20 MED ORDER — METOCLOPRAMIDE HCL 5 MG/ML IJ SOLN
10.0000 mg | Freq: Once | INTRAMUSCULAR | Status: AC
  Administered 2015-09-20: 10 mg via INTRAVENOUS
  Filled 2015-09-20: qty 2

## 2015-09-20 MED ORDER — SODIUM CHLORIDE 0.9 % IV BOLUS (SEPSIS)
1000.0000 mL | Freq: Once | INTRAVENOUS | Status: AC
  Administered 2015-09-20: 1000 mL via INTRAVENOUS

## 2015-09-20 MED ORDER — PROMETHAZINE HCL 25 MG/ML IJ SOLN
25.0000 mg | Freq: Once | INTRAMUSCULAR | Status: AC
  Administered 2015-09-20: 25 mg via INTRAVENOUS
  Filled 2015-09-20: qty 1

## 2015-09-20 MED ORDER — ONDANSETRON HCL 4 MG/2ML IJ SOLN
4.0000 mg | Freq: Once | INTRAMUSCULAR | Status: AC
  Administered 2015-09-20: 4 mg via INTRAVENOUS
  Filled 2015-09-20: qty 2

## 2015-09-20 MED ORDER — PROMETHAZINE HCL 25 MG PO TABS: 25.0000 mg | ORAL_TABLET | Freq: Four times a day (QID) | ORAL | Status: DC | PRN

## 2015-09-20 MED ORDER — FENTANYL CITRATE (PF) 100 MCG/2ML IJ SOLN
50.0000 ug | Freq: Once | INTRAMUSCULAR | Status: AC
  Administered 2015-09-20: 50 ug via INTRAVENOUS
  Filled 2015-09-20: qty 2

## 2015-09-20 MED ORDER — PROMETHAZINE HCL 25 MG/ML IJ SOLN
25.0000 mg | Freq: Once | INTRAMUSCULAR | Status: DC
  Filled 2015-09-20: qty 1

## 2015-09-20 MED ORDER — SODIUM CHLORIDE 0.9 % IV BOLUS (SEPSIS)
2000.0000 mL | Freq: Once | INTRAVENOUS | Status: AC
  Administered 2015-09-20: 2000 mL via INTRAVENOUS

## 2015-09-20 NOTE — ED Notes (Signed)
Pt unable to urinate at this time.  

## 2015-09-20 NOTE — ED Provider Notes (Signed)
CSN: [id number]     Arrival date & time 09/20/15  1000 History   First MD Initiated Contact with Patient 09/20/15 1002     Chief Complaint  Patient presents with  . Nausea  . Emesis     (Consider location/radiation/quality/duration/timing/severity/associated sxs/prior Treatment) HPI Comments: [name] with history of alcohol abuse, marijuana use, current everyday smoker, HTN, reflux presents for nausea and vomiting that she says began at 6 AM this morning.  The patient states that she felt well yesterday.  She does report drinking a large amount of alcohol last night. She reports associated pain in the epigastric area which she says she has had with these symptoms before.  She says the last time she had symptoms like this was 3 months ago and that she has not follow up outpatient because she does not have insurance.  No fevers or chills.  No diarrhea or constipation.  Patient is a 29 y.o. female presenting with vomiting.  Emesis Associated symptoms: abdominal pain   Associated symptoms: no chills, no diarrhea, no headaches and no myalgias     Past Medical History  Diagnosis Date  . Seasonal allergies   . Alcohol abuse   . Hypertension   . Reflux   . Anxiety    History reviewed. No pertinent past surgical history. History reviewed. No pertinent family history. Social History  Substance Use Topics  . Smoking status: Current Every Day Smoker    Types: Cigarettes  . Smokeless tobacco: None  . Alcohol Use: Yes     Comment: ETOH last night, 1 16 oz beer   OB History    No data available     Review of Systems  Constitutional: Negative for fever, chills, activity change, appetite change and fatigue.  HENT: Negative for congestion, postnasal drip and sinus pressure.   Eyes: Negative for pain and redness.  Respiratory: Negative for cough, chest tightness and shortness of breath.   Gastrointestinal: Positive for nausea, vomiting and abdominal pain. Negative for diarrhea and  constipation.  Genitourinary: Negative for dysuria, urgency, frequency and hematuria.  Musculoskeletal: Negative for myalgias and back pain.  Skin: Negative for rash.  Neurological: Negative for dizziness, weakness, light-headedness and headaches.  Hematological: Does not bruise/bleed easily.      Allergies  Tomato  Home Medications   Prior to Admission medications   Medication Sig Start Date End Date Taking? Authorizing Provider  hydrOXYzine (VISTARIL) 25 MG capsule Take 25 mg by mouth 3 (three) times daily as needed for anxiety.   Yes Historical Provider, MD  metoCLOPramide (REGLAN) 10 MG tablet Take 1 tablet (10 mg total) by mouth every 6 (six) hours as needed for nausea (nausea/headache). 05/21/15  Yes Gilda Creasehristopher J Pollina, MD  OLANZapine (ZYPREXA) 15 MG tablet Take 15 mg by mouth at bedtime.   Yes Historical Provider, MD  omeprazole (PRILOSEC) 20 MG capsule Take 1 capsule (20 mg total) by mouth daily. 04/03/15  Yes Azalia BilisKevin Campos, MD  promethazine (PHENERGAN) 25 MG tablet Take 1 tablet (25 mg total) by mouth every 6 (six) hours as needed for nausea or vomiting. 09/20/15   Leta BaptistEmily Roe Nguyen, MD   BP 159/108 mmHg  Pulse 92  Temp(Src) 97.9 F (36.6 C) (Oral)  Resp 18  SpO2 100%  LMP 09/20/2015 (Exact Date) Physical Exam  Constitutional: She is oriented to person, place, and time. She appears well-developed and well-nourished.  HENT:  Head: Normocephalic and atraumatic.  Right Ear: External ear normal.  Left Ear: External ear  normal.  Mouth/Throat: Oropharynx is clear and moist. No oropharyngeal exudate.  Eyes: EOM are normal. Pupils are equal, round, and reactive to light.  Neck: Normal range of motion. Neck supple.  Cardiovascular: Regular rhythm, normal heart sounds and intact distal pulses.  Tachycardia present.   No murmur heard. Pulmonary/Chest: Effort normal. No respiratory distress. She has no wheezes. She has no rales.  Abdominal: Soft. She exhibits no distension.  There is tenderness (mild in the epigastric area). There is no rebound, no CVA tenderness, no tenderness at McBurney's point and negative Murphy's sign.  Musculoskeletal: Normal range of motion. She exhibits no edema or tenderness.  Neurological: She is alert and oriented to person, place, and time.  Skin: Skin is warm. No rash noted. She is diaphoretic.  Vitals reviewed.   ED Course  Procedures (including critical care time) Labs Review Labs Reviewed  CBC WITH DIFFERENTIAL/PLATELET - Abnormal; Notable for the following:    Neutro Abs 7.8 (*)    All other components within normal limits  COMPREHENSIVE METABOLIC PANEL - Abnormal; Notable for the following:    Chloride 100 (*)    Glucose, Bld 115 (*)    BUN <5 (*)    Total Protein 8.3 (*)    All other components within normal limits  URINALYSIS, ROUTINE W REFLEX MICROSCOPIC (NOT AT [facility]) - Abnormal; Notable for the following:    Hgb urine dipstick SMALL (*)    Ketones, ur 15 (*)    Protein, ur 30 (*)    All other components within normal limits  I-STAT CG4 LACTIC ACID, ED - Abnormal; Notable for the following:    Lactic Acid, Venous 3.41 (*)    All other components within normal limits  LIPASE, BLOOD  URINE MICROSCOPIC-ADD ON  I-STAT BETA HCG BLOOD, ED ([facility], [facility], [facility] ONLY)  I-STAT CG4 LACTIC ACID, ED  I-STAT CG4 LACTIC ACID, ED  I-STAT BETA HCG BLOOD, ED ([facility], [facility], [facility] ONLY)    Imaging Review Dg Abd Acute W/chest  09/20/2015  CLINICAL DATA:  Nausea and vomiting. EXAM: DG ABDOMEN ACUTE W/ 1V CHEST COMPARISON:  Chest x-ray dated 03/27/2011 and CT scan abdomen dated 04/01/2015 FINDINGS: There is no evidence of dilated bowel loops or free intraperitoneal air. No radiopaque calculi or other significant radiographic abnormality is seen. Heart size and mediastinal contours are within normal limits. Both lungs are clear. Clothing artifact over the left mid lung zone. IMPRESSION: Negative abdominal radiographs.  No acute cardiopulmonary  disease. Electronically Signed   By: Francene Boyers M.D.   On: 09/20/2015 10:57   I have personally reviewed and evaluated these images and lab results as part of my medical decision-making.   EKG Interpretation None      MDM  Patient seen and evaluated in stable condition.  Results and notes from prior similar presentations reviewed.  Initial laboratory studies showed lactic acidosis.  AAS unremarkable.  Other labs unremarkable.  Patient given antiemetics and IV fluids with clearance of lactic acid.  Patient tolerated oral fluids as well as food in the ED.  Results were discussed with her at bedside.  Abdominal examination remained benign.  Patient was in agreement with plan for discharge.  Patient was discharged home in stable condition with prescription for Phenergan, instruction to follow up outpatient and to refrain from Marijuana, alcohol, smoking. Final diagnoses:  Non-intractable vomiting with nausea, vomiting of unspecified type  Dehydration    1. Nausea and vomiting  2. Dehydration    Leta Baptist, MD  09/20/15 2351 

## 2015-09-20 NOTE — ED Notes (Signed)
Patient left at this time with all belongings. 

## 2015-09-20 NOTE — ED Notes (Signed)
Pt to ED with complaint of nausea and vomiting x10 since 5 am this morning. Reports epigastric pain described as tightness. A/O x4. Pt reports frequent alcohol use and smokes cigarettes, as well as marijuana. ETOH use last night.

## 2015-09-20 NOTE — Discharge Instructions (Signed)

## 2015-09-20 NOTE — ED Notes (Signed)
Family expressing concerns that patient needs antibiotics/pain meds/admission, requesting RN to perform rectal temperature. Pt refusing, states she just wants to go home. Provided patient with discharge papers, she is actively vomiting so RN will give dose of phenergan prior to pt leaving.

## 2015-09-22 ENCOUNTER — Inpatient Hospital Stay (HOSPITAL_COMMUNITY)
Admission: EM | Admit: 2015-09-22 | Discharge: 2015-09-24 | DRG: 103 | Disposition: A | Discharge status: Home / Self Care | Payer: Self-pay | Attending: Infectious Diseases | Admitting: Infectious Diseases

## 2015-09-22 ENCOUNTER — Encounter (HOSPITAL_COMMUNITY): Payer: Self-pay | Admitting: Emergency Medicine

## 2015-09-22 DIAGNOSIS — F319 Bipolar disorder, unspecified: Secondary | ICD-10-CM | POA: Diagnosis present

## 2015-09-22 DIAGNOSIS — F32A Depression, unspecified: Secondary | ICD-10-CM | POA: Insufficient documentation

## 2015-09-22 DIAGNOSIS — F17200 Nicotine dependence, unspecified, uncomplicated: Secondary | ICD-10-CM | POA: Diagnosis present

## 2015-09-22 DIAGNOSIS — I10 Essential (primary) hypertension: Secondary | ICD-10-CM | POA: Diagnosis present

## 2015-09-22 DIAGNOSIS — K219 Gastro-esophageal reflux disease without esophagitis: Secondary | ICD-10-CM | POA: Diagnosis present

## 2015-09-22 DIAGNOSIS — F1910 Other psychoactive substance abuse, uncomplicated: Secondary | ICD-10-CM | POA: Insufficient documentation

## 2015-09-22 DIAGNOSIS — R1115 Cyclical vomiting syndrome unrelated to migraine: Secondary | ICD-10-CM | POA: Diagnosis present

## 2015-09-22 DIAGNOSIS — F12288 Cannabis dependence with other cannabis-induced disorder: Secondary | ICD-10-CM

## 2015-09-22 DIAGNOSIS — F329 Major depressive disorder, single episode, unspecified: Secondary | ICD-10-CM | POA: Insufficient documentation

## 2015-09-22 DIAGNOSIS — E876 Hypokalemia: Secondary | ICD-10-CM | POA: Diagnosis present

## 2015-09-22 DIAGNOSIS — G43A0 Cyclical vomiting, not intractable: Principal | ICD-10-CM | POA: Diagnosis present

## 2015-09-22 DIAGNOSIS — F3131 Bipolar disorder, current episode depressed, mild: Secondary | ICD-10-CM | POA: Insufficient documentation

## 2015-09-22 DIAGNOSIS — K920 Hematemesis: Secondary | ICD-10-CM | POA: Diagnosis present

## 2015-09-22 DIAGNOSIS — F419 Anxiety disorder, unspecified: Secondary | ICD-10-CM | POA: Diagnosis present

## 2015-09-22 DIAGNOSIS — Z91018 Allergy to other foods: Secondary | ICD-10-CM

## 2015-09-22 DIAGNOSIS — F1290 Cannabis use, unspecified, uncomplicated: Secondary | ICD-10-CM | POA: Diagnosis present

## 2015-09-22 LAB — URINALYSIS, ROUTINE W REFLEX MICROSCOPIC
Bilirubin Urine: NEGATIVE
Glucose, UA: NEGATIVE mg/dL
Ketones, ur: 15 mg/dL — AB
LEUKOCYTES UA: NEGATIVE
NITRITE: NEGATIVE
PH: 6.5 (ref 5.0–8.0)
Protein, ur: NEGATIVE mg/dL
SPECIFIC GRAVITY, URINE: 1.016 (ref 1.005–1.030)
UROBILINOGEN UA: 0.2 mg/dL (ref 0.0–1.0)

## 2015-09-22 LAB — BASIC METABOLIC PANEL
Anion gap: 7 (ref 5–15)
CALCIUM: 7.6 mg/dL — AB (ref 8.9–10.3)
CHLORIDE: 102 mmol/L (ref 101–111)
CO2: 25 mmol/L (ref 22–32)
CREATININE: 0.73 mg/dL (ref 0.44–1.00)
Glucose, Bld: 118 mg/dL — ABNORMAL HIGH (ref 65–99)
Potassium: 3 mmol/L — ABNORMAL LOW (ref 3.5–5.1)
SODIUM: 134 mmol/L — AB (ref 135–145)

## 2015-09-22 LAB — CBC
HEMATOCRIT: 41 % (ref 36.0–46.0)
HEMOGLOBIN: 14.1 g/dL (ref 12.0–15.0)
MCH: 33.6 pg (ref 26.0–34.0)
MCHC: 34.4 g/dL (ref 30.0–36.0)
MCV: 97.6 fL (ref 78.0–100.0)
Platelets: 225 10*3/uL (ref 150–400)
RBC: 4.2 MIL/uL (ref 3.87–5.11)
RDW: 12.9 % (ref 11.5–15.5)
WBC: 11.5 10*3/uL — AB (ref 4.0–10.5)

## 2015-09-22 LAB — URINE MICROSCOPIC-ADD ON

## 2015-09-22 LAB — MAGNESIUM: Magnesium: 1.9 mg/dL (ref 1.7–2.4)

## 2015-09-22 LAB — COMPREHENSIVE METABOLIC PANEL
ALBUMIN: 4.2 g/dL (ref 3.5–5.0)
ALT: 24 U/L (ref 14–54)
ANION GAP: 11 (ref 5–15)
AST: 32 U/L (ref 15–41)
Alkaline Phosphatase: 70 U/L (ref 38–126)
BILIRUBIN TOTAL: 0.7 mg/dL (ref 0.3–1.2)
BUN: 7 mg/dL (ref 6–20)
CHLORIDE: 101 mmol/L (ref 101–111)
CO2: 28 mmol/L (ref 22–32)
Calcium: 10.2 mg/dL (ref 8.9–10.3)
Creatinine, Ser: 1.06 mg/dL — ABNORMAL HIGH (ref 0.44–1.00)
GFR calc Af Amer: >60 mL/min (ref >60)
Glucose, Bld: 164 mg/dL — ABNORMAL HIGH (ref 65–99)
POTASSIUM: 2.7 mmol/L — AB (ref 3.5–5.1)
Sodium: 140 mmol/L (ref 135–145)
TOTAL PROTEIN: 7.8 g/dL (ref 6.5–8.1)

## 2015-09-22 LAB — LIPASE, BLOOD: LIPASE: 25 U/L (ref 11–51)

## 2015-09-22 MED ORDER — OLANZAPINE 7.5 MG PO TABS
15.0000 mg | ORAL_TABLET | Freq: Every day | ORAL | Status: DC
  Administered 2015-09-22 – 2015-09-23 (×2): 15 mg via ORAL
  Filled 2015-09-22 (×3): qty 2

## 2015-09-22 MED ORDER — DEXTROSE-NACL 5-0.45 % IV SOLN
INTRAVENOUS | Status: DC
  Administered 2015-09-22 – 2015-09-24 (×3): via INTRAVENOUS

## 2015-09-22 MED ORDER — PROSIGHT PO TABS
1.0000 | ORAL_TABLET | Freq: Every day | ORAL | Status: DC
  Administered 2015-09-24: 1 via ORAL
  Filled 2015-09-22 (×2): qty 1

## 2015-09-22 MED ORDER — DICYCLOMINE HCL 10 MG/ML IM SOLN
20.0000 mg | Freq: Once | INTRAMUSCULAR | Status: AC
  Administered 2015-09-22: 20 mg via INTRAMUSCULAR
  Filled 2015-09-22: qty 2

## 2015-09-22 MED ORDER — POTASSIUM CHLORIDE CRYS ER 20 MEQ PO TBCR
40.0000 meq | EXTENDED_RELEASE_TABLET | Freq: Two times a day (BID) | ORAL | Status: DC
  Administered 2015-09-23 – 2015-09-24 (×3): 40 meq via ORAL
  Filled 2015-09-22 (×4): qty 2

## 2015-09-22 MED ORDER — MAGNESIUM SULFATE 2 GM/50ML IV SOLN
2.0000 g | Freq: Once | INTRAVENOUS | Status: AC
  Administered 2015-09-22: 2 g via INTRAVENOUS
  Filled 2015-09-22: qty 50

## 2015-09-22 MED ORDER — PANTOPRAZOLE SODIUM 40 MG PO TBEC
40.0000 mg | DELAYED_RELEASE_TABLET | Freq: Every day | ORAL | Status: DC
  Administered 2015-09-24: 40 mg via ORAL
  Filled 2015-09-22 (×2): qty 1

## 2015-09-22 MED ORDER — VITAMIN B-1 100 MG PO TABS
100.0000 mg | ORAL_TABLET | Freq: Every day | ORAL | Status: DC
  Administered 2015-09-24: 100 mg via ORAL
  Filled 2015-09-22 (×2): qty 1

## 2015-09-22 MED ORDER — POTASSIUM CHLORIDE CRYS ER 20 MEQ PO TBCR
40.0000 meq | EXTENDED_RELEASE_TABLET | Freq: Once | ORAL | Status: AC
  Administered 2015-09-22: 40 meq via ORAL
  Filled 2015-09-22: qty 2

## 2015-09-22 MED ORDER — DIPHENHYDRAMINE HCL 50 MG/ML IJ SOLN
25.0000 mg | Freq: Once | INTRAMUSCULAR | Status: AC
  Administered 2015-09-22: 25 mg via INTRAVENOUS
  Filled 2015-09-22: qty 1

## 2015-09-22 MED ORDER — ONDANSETRON HCL 4 MG PO TABS: 4.0000 mg | ORAL_TABLET | Freq: Four times a day (QID) | ORAL | Status: DC | PRN

## 2015-09-22 MED ORDER — GI COCKTAIL ~~LOC~~
30.0000 mL | Freq: Once | ORAL | Status: AC
  Administered 2015-09-22: 30 mL via ORAL
  Filled 2015-09-22: qty 30

## 2015-09-22 MED ORDER — SODIUM CHLORIDE 0.9 % IV BOLUS (SEPSIS)
1000.0000 mL | Freq: Once | INTRAVENOUS | Status: AC
Start: 2015-09-22 — End: 2015-09-22
  Administered 2015-09-22: 1000 mL via INTRAVENOUS

## 2015-09-22 MED ORDER — FOLIC ACID 1 MG PO TABS
1.0000 mg | ORAL_TABLET | Freq: Every day | ORAL | Status: DC
  Administered 2015-09-24: 1 mg via ORAL
  Filled 2015-09-22 (×2): qty 1

## 2015-09-22 MED ORDER — KETOROLAC TROMETHAMINE 15 MG/ML IJ SOLN
15.0000 mg | Freq: Three times a day (TID) | INTRAMUSCULAR | Status: DC
  Administered 2015-09-22 – 2015-09-24 (×5): 15 mg via INTRAVENOUS
  Filled 2015-09-22 (×5): qty 1

## 2015-09-22 MED ORDER — POTASSIUM CHLORIDE 10 MEQ/100ML IV SOLN
10.0000 meq | INTRAVENOUS | Status: AC
  Administered 2015-09-22 (×3): 10 meq via INTRAVENOUS
  Filled 2015-09-22 (×3): qty 100

## 2015-09-22 MED ORDER — PROCHLORPERAZINE EDISYLATE 5 MG/ML IJ SOLN
10.0000 mg | Freq: Four times a day (QID) | INTRAMUSCULAR | Status: DC | PRN
  Administered 2015-09-22 – 2015-09-23 (×3): 10 mg via INTRAVENOUS
  Filled 2015-09-22 (×4): qty 2

## 2015-09-22 MED ORDER — ONDANSETRON HCL 4 MG/2ML IJ SOLN
4.0000 mg | Freq: Once | INTRAMUSCULAR | Status: AC
  Administered 2015-09-22: 4 mg via INTRAVENOUS
  Filled 2015-09-22: qty 2

## 2015-09-22 MED ORDER — ONDANSETRON HCL 4 MG/2ML IJ SOLN
4.0000 mg | Freq: Four times a day (QID) | INTRAMUSCULAR | Status: DC | PRN
  Administered 2015-09-22 – 2015-09-24 (×3): 4 mg via INTRAVENOUS
  Filled 2015-09-22 (×4): qty 2

## 2015-09-22 NOTE — ED Notes (Signed)
Pt from home for eval of generalized abd pain that started Saturday, pt seen on Saturday for same and discharged with phenergen and states has not had any relief. Pt deneis any urinary or vaginal issues. Pt in pain in triage, meds offered. Pt states wait until she gets in room.

## 2015-09-22 NOTE — ED Notes (Signed)
Attempted report to [facility].  

## 2015-09-22 NOTE — H&P (Signed)
Date: 09/22/2015               Patient Name:  Taylor Bartlett MRN: [id number]  DOB: 1986/11/04 Age / Sex: 29 y.o., female   PCP: No Pcp Per Patient         Medical Service: Internal Medicine Teaching Service         Attending Physician: Dr. Ginnie SmartJeffrey C Vuong Musa, MD    First Contact: Dr. Reubin MilanBilly Kennedy Pager: [phone]  Second Contact: Dr. Rich Numberarly Rivet Pager: [phone]       After Hours (After 5p/  First Contact Pager: [phone]  weekends / holidays): Second Contact Pager: [phone]   Chief Complaint: Vomiting and abdominal pain  History of Present Illness: Taylor Bartlett is a 29yo with PMH bipolar disorder who presents with vomiting and generalized abdominal pain that started 3 days ago. She says she smoked "about five blunts" on Saturday as well as a 16oz beer and then was noted to have clear/yellowish vomiting with some specks of blood in it. She also was noted to have generalized abdominal pain which she describes as squeezing, 10/10 in severity, and non-radiating. She says that vomiting improves her symptoms. She presented on 10/29 with these symptoms, and the antiemetics given did give her partial relief as she said she felt much better yesterday, but the symptoms recurred again this morning which prompted her return. She says these symptoms have occurred "every 3 months for the past 2 years", and last roughly 5-6 days before resolving. She does not have these symptoms in between episodes. She says she occasionally gets short of breath when vomiting, and has had 2 episodes of dark-colored diarrhea but she denies fevers, night sweats, weight loss, chest pain, palpitations, changes in urination, constipation, melena or hematochezia, any rashes, numbness or weakness. She denies any food ingestion a few hours prior, no sick contacts or people with similar symptoms, or recent travel.  Meds: Current Facility-Administered Medications  Medication Dose Route Frequency Provider Last Rate Last Dose    . potassium chloride 10 mEq in 100 mL IVPB  10 mEq Intravenous Q1 Hr x 3 Alvira MondayErin Schlossman, MD 100 mL/hr at 09/22/15 1655 10 mEq at 09/22/15 1655  . prochlorperazine (COMPAZINE) injection 10 mg  10 mg Intravenous Q6H PRN Alvira MondayErin Schlossman, MD   10 mg at 09/22/15 1655   Current Outpatient Prescriptions  Medication Sig Dispense Refill  . hydrOXYzine (VISTARIL) 25 MG capsule Take 25 mg by mouth 3 (three) times daily as needed for anxiety.    Marland Kitchen. OLANZapine (ZYPREXA) 15 MG tablet Take 15 mg by mouth at bedtime.    . metoCLOPramide (REGLAN) 10 MG tablet Take 1 tablet (10 mg total) by mouth every 6 (six) hours as needed for nausea (nausea/headache). (Patient not taking: Reported on 09/22/2015) 15 tablet 0  . omeprazole (PRILOSEC) 20 MG capsule Take 1 capsule (20 mg total) by mouth daily. (Patient not taking: Reported on 09/22/2015) 30 capsule 0  . promethazine (PHENERGAN) 25 MG tablet Take 1 tablet (25 mg total) by mouth every 6 (six) hours as needed for nausea or vomiting. (Patient not taking: Reported on 09/22/2015) 20 tablet 0    Allergies: Allergies as of 09/22/2015 - Review Complete 09/22/2015  Allergen Reaction Noted  . Tomato Anaphylaxis and Itching 08/04/2012   Past Medical History  Diagnosis Date  . Seasonal allergies   . Alcohol abuse   . Hypertension   . Reflux   . Anxiety    History reviewed. No pertinent past surgical  history. No family history on file. Social History   Social History  . Marital Status: Single    Spouse Name: N/A  . Number of Children: N/A  . Years of Education: N/A   Occupational History  . Not on file.   Social History Main Topics  . Smoking status: Current Every Day Smoker    Types: Cigarettes  . Smokeless tobacco: Not on file  . Alcohol Use: Yes     Comment: ETOH last night, 1 16 oz beer  . Drug Use: Yes    Special: Marijuana  . Sexual Activity: Yes    Birth Control/ Protection: None   Other Topics Concern  . Not on file   Social History  Narrative    Review of Systems: Pertinent items noted in HPI and remainder of comprehensive 12 point ROS otherwise negative.  Physical Exam: Blood pressure 146/93, pulse 82, temperature 97 F (36.1 C), temperature source Oral, resp. rate 19, weight 138 lb (62.596 kg), last menstrual period 09/20/2015, SpO2 100 %. BP 146/93 mmHg  Pulse 82  Temp(Src) 97 F (36.1 C) (Oral)  Resp 19  Wt 138 lb (62.596 kg)  SpO2 100%  LMP 09/20/2015 (Exact Date)  General Appearance:    Alert, cooperative, uncomfortable, appears stated age  Head:    Normocephalic, without obvious abnormality, atraumatic  Eyes:    PERRL, conjunctiva/corneas clear, EOM's intact, fundi    benign, both eyes  Ears:    Normal TM's and external ear canals, both ears  Nose:   Nares normal, septum midline, mucosa normal, no drainage    or sinus tenderness  Throat:   Lips, mucosa, and tongue normal; teeth and gums normal  Neck:   Supple, symmetrical, trachea midline, no adenopathy;    thyroid:  no enlargement/tenderness/nodules; no carotid   bruit or JVD  Back:     Symmetric, no curvature, ROM normal, no CVA tenderness  Lungs:     Clear to auscultation bilaterally, respirations unlabored  Chest Wall:    No tenderness or deformity   Heart:    Regular rate and rhythm, S1 and S2 normal, no murmur, rub   or gallop  Abdomen:     Soft, diffusely tender without rebound or guarding, bowel sounds active all four quadrants,    no masses, no organomegaly  Extremities:   Extremities normal, atraumatic, no cyanosis or edema  Pulses:   2+ and symmetric all extremities  Skin:   Skin color, texture, turgor normal, no rashes or lesions  Lymph nodes:   Cervical, supraclavicular, and axillary nodes normal  Neurologic:   CNII-XII intact, normal strength, sensation and reflexes    throughout   Lab results: Basic Metabolic Panel:  Recent Labs  40/98/11 1030 09/22/15 1355  NA 139 140  K 3.6 2.7*  CL 100* 101  CO2 24 28  GLUCOSE 115*  164*  BUN <5* 7  CREATININE 0.92 1.06*  CALCIUM 10.2 10.2   Liver Function Tests:  Recent Labs  09/20/15 1030 09/22/15 1355  AST 38 32  ALT 23 24  ALKPHOS 73 70  BILITOT 0.3 0.7  PROT 8.3* 7.8  ALBUMIN 4.5 4.2    Recent Labs  09/20/15 1030 09/22/15 1355  LIPASE 27 25   CBC:  Recent Labs  09/20/15 1030 09/22/15 1355  WBC 10.1 11.5*  NEUTROABS 7.8*  --   HGB 13.7 14.1  HCT 39.9 41.0  MCV 95.9 97.6  PLT 232 225  Urine Drug Screen: Drugs of Abuse  Component Value Date/Time   LABOPIA NONE DETECTED 07/31/2012 1513   COCAINSCRNUR NONE DETECTED 07/31/2012 1513   LABBENZ NONE DETECTED 07/31/2012 1513   AMPHETMU NONE DETECTED 07/31/2012 1513   THCU POSITIVE* 07/31/2012 1513   LABBARB NONE DETECTED 07/31/2012 1513   Urinalysis:  Recent Labs  09/20/15 1315  COLORURINE YELLOW  LABSPEC 1.015  PHURINE 8.0  GLUCOSEU NEGATIVE  HGBUR SMALL*  BILIRUBINUR NEGATIVE  KETONESUR 15*  PROTEINUR 30*  UROBILINOGEN 1.0  NITRITE NEGATIVE  LEUKOCYTESUR NEGATIVE   Other results: EKG: normal EKG, normal sinus rhythm, unchanged from previous tracings.  Assessment & Plan by Problem: Active Problems:   Cyclic vomiting syndrome 1. Cyclic vomiting syndrome - patient has history of recurrent episodes of vomiting and abdominal pain that last 5-6 days, occur every 3 months and have occurred several times in the past 2 years, and without residual symptoms in between episodes, fulfilling all 3 Rome IV criteria. KUB normal, EKG normal, lactate normal, hCG negative, mild WBC to 11.5, Cr 1.06 from 0.9 baseline so no AKI but in the setting of extensive vomiting and K 2.7, most likely related to GI loss. However, CVS does remain a diagnosis of exclusion. Other items in the differential include marijuana hyperemesis, migraines (for which she has no prodromal symptoms or migraine-like headache), GI obstruction (KUB normal), pregnancy (negative) among others.  -Patient is s/p zofran  IM  x1 -Give Bentyl IM and benadryl IV as well as GI cocktail - assess response -Prochlorperazine  IV q6hrs PRN -IV NS for fluid resuscitation -Replace electrolytes as needed, KCl given in ED for initial K of 2.7 -Recommend hot showers -Continue to counsel patient on marijuana cessation -Advance diet as tolerated  Diet - clears as tolerated  PPX -DVT: Lovenox  Dispo: Disposition is deferred at this time, awaiting improvement of current medical problems. Anticipated discharge in approximately 1-2 day(s).   The patient does not have a current PCP (No Pcp Per Patient) and does need an [facility] hospital follow-up appointment after discharge.  The patient does not know have transportation limitations that hinder transportation to clinic appointments.  Signed: Darrick Huntsman, MD 09/22/2015, 5:10 PM     Date: 09/23/2015  Patient name: Taylor Bartlett  Medical record number: [id number]  Date of birth: 1986/11/15   I have seen and evaluated Taylor Bartlett and discussed their care with the Residency Team.   Assessment and Plan: I have seen and evaluated the patient as outlined above. I agree with the formulated Assessment and Plan as detailed in the residents' admission note, with the following changes:   1. Cyclic Vomitting  Will discuss her case with GI as far as need for EGD, prophylactic rx she could take after d/c  Check FOBT  Her sx seem to get worse when she stops smoking marijuana (none for last 17 days)   2. Bipolar   Will f/u with mental health at d/c.    Ginnie Smart, MD 11/1/201612:35 PM

## 2015-09-22 NOTE — Progress Notes (Signed)
Arrived from ED. Alert and oriented x4. C/O of abdominal pain 8/10. Oriented to room. Call bell within reach. Will continue to monitor.

## 2015-09-22 NOTE — ED Provider Notes (Signed)
CSN: [id number]     Arrival date & time 09/22/15  1249 [id number]   First MD Initiated Contact with Patient 09/22/15 1411     Chief Complaint  Patient presents with  . Abdominal Pain  . Nausea     (Consider location/radiation/quality/duration/timing/severity/associated sxs/prior Treatment) Patient is a 29 y.o. female presenting with abdominal pain.  Abdominal Pain Pain location:  Generalized Pain quality: aching   Pain radiates to:  Does not radiate Pain severity:  Severe Onset quality:  Gradual Duration:  3 days Timing:  Constant Progression:  Unchanged Relieved by: gagging. Worsened by:  Nothing tried Ineffective treatments: phenergan. Associated symptoms: chills, diarrhea (twie), nausea and vomiting (since Saturday, every hour)   Associated symptoms: no anorexia (have appetite but cant keep anything down), no chest pain, no cough, no dysuria, no fever, no shortness of breath, no sore throat, no vaginal bleeding and no vaginal discharge   Risk factors: has not had multiple surgeries and not pregnant     Past Medical History  Diagnosis Date  . Seasonal allergies   . Alcohol abuse   . Hypertension   . Reflux   . Anxiety    History reviewed. No pertinent past surgical history. No family history on file. Social History  Substance Use Topics  . Smoking status: Current Every Day Smoker    Types: Cigarettes  . Smokeless tobacco: None  . Alcohol Use: Yes     Comment: ETOH last night, 1 16 oz beer   OB History    No data available     Review of Systems  Constitutional: Positive for chills. Negative for fever.  HENT: Negative for sore throat.   Eyes: Negative for visual disturbance.  Respiratory: Negative for cough and shortness of breath.   Cardiovascular: Negative for chest pain.  Gastrointestinal: Positive for nausea, vomiting (since Saturday, every hour), abdominal pain and diarrhea (twie). Negative for anorexia (have appetite but cant keep anything down).   Endocrine: Positive for polydipsia.  Genitourinary: Negative for dysuria, vaginal bleeding, vaginal discharge and difficulty urinating.  Musculoskeletal: Negative for back pain and neck pain.  Skin: Negative for rash.  Neurological: Negative for syncope and headaches.      Allergies  Tomato  Home Medications   Prior to Admission medications   Medication Sig Start Date End Date Taking? Authorizing Provider  hydrOXYzine (VISTARIL) 25 MG capsule Take 25 mg by mouth 3 (three) times daily as needed for anxiety.   Yes Historical Provider, MD  OLANZapine (ZYPREXA) 15 MG tablet Take 15 mg by mouth at bedtime.   Yes Historical Provider, MD  metoCLOPramide (REGLAN) 10 MG tablet Take 1 tablet (10 mg total) by mouth every 6 (six) hours as needed for nausea (nausea/headache). Patient not taking: Reported on 09/22/2015 05/21/15   Gilda Creasehristopher J Pollina, MD  omeprazole (PRILOSEC) 20 MG capsule Take 1 capsule (20 mg total) by mouth daily. Patient not taking: Reported on 09/22/2015 04/03/15   Azalia BilisKevin Campos, MD  promethazine (PHENERGAN) 25 MG tablet Take 1 tablet (25 mg total) by mouth every 6 (six) hours as needed for nausea or vomiting. Patient not taking: Reported on 09/22/2015 09/20/15   Leta BaptistEmily Roe Nguyen, MD   BP 140/92 mmHg  Pulse 89  Temp(Src) 98.4 F (36.9 C) (Oral)  Resp 19  Ht 5\' 2"  (1.575 m)  Wt 134 lb 4.2 oz (60.9 kg)  BMI 24.55 kg/m2  SpO2 100%  LMP 09/20/2015 (Exact Date) Physical Exam  Constitutional: She is oriented to person, place, and time.  She appears well-developed and well-nourished. She appears ill. She appears distressed (actively vomiting).  HENT:  Head: Normocephalic and atraumatic.  Mouth/Throat: Mucous membranes are dry.  Eyes: Conjunctivae and EOM are normal.  Neck: Normal range of motion.  Cardiovascular: Normal rate, regular rhythm, normal heart sounds and intact distal pulses.  Exam reveals no gallop and no friction rub.   No murmur heard. Pulmonary/Chest:  Effort normal and breath sounds normal. No respiratory distress. She has no wheezes. She has no rales.  Abdominal: Soft. She exhibits no distension. There is no tenderness (mild, diffuse, reports worse in LLQ). There is no guarding and no CVA tenderness.  Musculoskeletal: She exhibits no edema or tenderness.  Neurological: She is alert and oriented to person, place, and time.  Skin: Skin is warm and dry. No rash noted. She is not diaphoretic. No erythema.  Nursing note and vitals reviewed.   ED Course  Procedures (including critical care time) Labs Review Labs Reviewed  COMPREHENSIVE METABOLIC PANEL - Abnormal; Notable for the following:    Potassium 2.7 (*)    Glucose, Bld 164 (*)    Creatinine, Ser 1.06 (*)    All other components within normal limits  CBC - Abnormal; Notable for the following:    WBC 11.5 (*)    All other components within normal limits  URINALYSIS, ROUTINE W REFLEX MICROSCOPIC (NOT AT [facility]) - Abnormal; Notable for the following:    APPearance HAZY (*)    Hgb urine dipstick LARGE (*)    Ketones, ur 15 (*)    All other components within normal limits  URINE MICROSCOPIC-ADD ON - Abnormal; Notable for the following:    Squamous Epithelial / LPF FEW (*)    Bacteria, UA FEW (*)    All other components within normal limits  LIPASE, BLOOD  BASIC METABOLIC PANEL  BASIC METABOLIC PANEL  MAGNESIUM  I-STAT BETA HCG BLOOD, ED ([facility], [facility], [facility] ONLY)    Imaging Review No results found. I have personally reviewed and evaluated these images and lab results as part of my medical decision-making.   EKG Interpretation None      MDM   Final diagnoses:  Intractable cyclical vomiting with nausea   29 yo female with history of cyclic vomiting, etoh, cannabinoid use, presents with concern for nausea, vomiting and abdominal pain. Episode similar to prior. Seen on Saturday for same and was discharged with phenergan however was still unable to keep down fluids over the last 2  days.  Patient with benign abdominal exam, and given history, and exam have low suspicion for appendicitis, TOA, PID, cholecystitis.  Pregnancy test negative 2 days ago, istat hcg result today completed however not in computer.  Emesis likely secondary to cannabinoid hyperemesis syndrome or other cyclic vomiting. Given zofran IV, NS with continuing symptoms and given compazine, benadryl, GI cocktail. Labs significant for potassium of 2.7. Given patient has failed outpatient treatment with phenergan for n/v, and had intractable emesis with hypokalemia, will admit for observation, antiemetics, potassium replacement and rehydration.     Alvira Monday, MD 09/22/15 2110

## 2015-09-23 DIAGNOSIS — F1210 Cannabis abuse, uncomplicated: Secondary | ICD-10-CM

## 2015-09-23 DIAGNOSIS — K920 Hematemesis: Secondary | ICD-10-CM

## 2015-09-23 DIAGNOSIS — F329 Major depressive disorder, single episode, unspecified: Secondary | ICD-10-CM | POA: Insufficient documentation

## 2015-09-23 DIAGNOSIS — R1115 Cyclical vomiting syndrome unrelated to migraine: Secondary | ICD-10-CM | POA: Insufficient documentation

## 2015-09-23 DIAGNOSIS — F3131 Bipolar disorder, current episode depressed, mild: Secondary | ICD-10-CM | POA: Insufficient documentation

## 2015-09-23 DIAGNOSIS — F39 Unspecified mood [affective] disorder: Secondary | ICD-10-CM

## 2015-09-23 DIAGNOSIS — F1910 Other psychoactive substance abuse, uncomplicated: Secondary | ICD-10-CM | POA: Insufficient documentation

## 2015-09-23 DIAGNOSIS — F32A Depression, unspecified: Secondary | ICD-10-CM | POA: Insufficient documentation

## 2015-09-23 DIAGNOSIS — E876 Hypokalemia: Secondary | ICD-10-CM

## 2015-09-23 DIAGNOSIS — G43A1 Cyclical vomiting, intractable: Secondary | ICD-10-CM

## 2015-09-23 LAB — BASIC METABOLIC PANEL
ANION GAP: 8 (ref 5–15)
BUN: <5 mg/dL — ABNORMAL LOW (ref 6–20)
CHLORIDE: 105 mmol/L (ref 101–111)
CO2: 25 mmol/L (ref 22–32)
Calcium: 8.2 mg/dL — ABNORMAL LOW (ref 8.9–10.3)
Creatinine, Ser: 0.78 mg/dL (ref 0.44–1.00)
GFR calc non Af Amer: >60 mL/min (ref >60)
Glucose, Bld: 109 mg/dL — ABNORMAL HIGH (ref 65–99)
POTASSIUM: 3.1 mmol/L — AB (ref 3.5–5.1)
Sodium: 138 mmol/L (ref 135–145)

## 2015-09-23 MED ORDER — METOCLOPRAMIDE HCL 10 MG PO TABS: 10.0000 mg | ORAL_TABLET | Freq: Four times a day (QID) | ORAL | Status: DC | PRN

## 2015-09-23 MED ORDER — METOCLOPRAMIDE HCL 5 MG PO TABS: 5.0000 mg | ORAL_TABLET | Freq: Four times a day (QID) | ORAL | Status: DC | PRN

## 2015-09-23 MED ORDER — GI COCKTAIL ~~LOC~~
30.0000 mL | Freq: Once | ORAL | Status: AC
  Administered 2015-09-23: 30 mL via ORAL
  Filled 2015-09-23: qty 30

## 2015-09-23 MED ORDER — WHITE PETROLATUM GEL
Status: DC | PRN
  Administered 2015-09-23: 14:00:00 via TOPICAL
  Filled 2015-09-23: qty 1

## 2015-09-23 NOTE — Care Management Note (Signed)
Case Management Note  Patient Details  Name: Taylor Bartlett MRN: [id number] Date of Birth: Jan 13, 1986  Subjective/Objective:                    Action/Plan: Patient admitted with cyclic vomiting syndrome. Patient lives at home with her family. Patient does not have insurance and is followed by Gavin Poundeborah in financial counseling. Patient also does not have a PCP listed. CM will continue to follow for discharge needs.   Expected Discharge Date:                  Expected Discharge Plan:  Home/Self Care  In-House Referral:     Discharge planning Services     Post Acute Care Choice:    Choice offered to:     DME Arranged:    DME Agency:     HH Arranged:    HH Agency:     Status of Service:  In process, will continue to follow  Medicare Important Message Given:    Date Medicare IM Given:    Medicare IM give by:    Date Additional Medicare IM Given:    Additional Medicare Important Message give by:     If discussed at Long Length of Stay Meetings, dates discussed:    Additional Comments:  Kermit BaloKelli F Jamel Dunton, RN 09/23/2015, 3:49 PM

## 2015-09-23 NOTE — Progress Notes (Signed)
Vomited yellowish emesis. Medicated with Zofran IV with effective result.

## 2015-09-23 NOTE — Progress Notes (Signed)
Subjective: [name] is a 29 year old female with a history of bipolar disorder and hypertension who presented on 10/31 with 3 days of generalized abdominal pain and 8-10 episodes of emesis/day.   In the ED yesterday, she received a GI cocktail, Zofran 4 mg X2, and Compazine 10 mg and had no further emesis until this AM, when she drank grape juice and immediately vomited up undigested juice with a few flecks of coffee grounds. She reports lingering nausea that is only relieved by vomiting and continues having photophobia and fatigue after emesis. Her abdominal pain is improved, and she describes no further dark stool or diarrhea today. She previously said she last smoked 5 marjuana blunts on 10/29 but today stated she had not smoked in 17 days before admission, which triggered her recent symptoms. She has N/V spells like these each time she stops smoking marajuana, as well as intermittently after questionable food exposures. She has tried Phenergan to no relief but usually responds to IV Zofran in the ED.   Her symptoms began in 2011, and she has had frequent ED visits for intractable nausea and vomiting. 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 She completed a rehab program at [facility] for alcohol abuse in 2011 and notes she only has a beer a few times a month. She has a history of GERD, dx in 2007, and stopped taking Prilosec about 3 years ago ("too many pills"). Her other outpatient medications include Visteril twice daily and Advil and Aleve about 2x a week for dental pain, which started last week.  Her outpt psychiatrist is Dr[facility]ord [facility] at [facility], and she was prescribed Zyprexa for "depression" 2 years ago after she developed orthostatic hypotension on a brief Seroquel trial. She  describes her current mood as "good" and believes the medication is helping her stay stable. She has gained approximately 30 lb since starting Zyprexa, although she notes 10 lb of weight loss in the past 2 weeks s/t emesis and marajuana cessation with accompanying decreased appetite. She denies any AVH or paranoia.   Objective: T 36.1-37.1   HR 74-99   RR 15-23   BP: 122-164 / 80-116   SpO2 94-100%  General: Sleepy-appearing female with close-cropped hair, conversing while laying in bed, much more comfortable-appearing than yesterday HEENT: some conjunctival injection on L eye. CV: regular rate and rhythm Lungs: Clear to auscultation bilaterally, no wheezes or crackles Abd: Soft, non-tender even to deep palpation, no masses or HSM appreciated; BS present, if soft. Neuro: A&OX3, CN II-XII grossly in tact Psych: Mood "Good"; affect congruent with mood, with laughter and smiles; speech spontaneous, goal-directed, linear, with no AVH, SI, HI thought content.   Labs: Morning BMP: Na 138, K 3.1 (yest PM 2.7); CO2 25; Cr 0.78; BG 109 b-hCG: neg UA sig for "Large Hb dipstick"; SG 1.016  Assessment/Plan:  [name] is a 29 year old female with a history of mood disorder (bipolar v. Atypical depression) and hypertension who is on hospital day 2 for intractible nausea and vomiting X 3 days. Her hydration status and hypokalemia s/t repeated emesis are improving, but we will monitor overnight for adequate nausea control and out of concern for hematemesis and possible melena. We will obtain FOBT, monitor electrolytes and fluid status, and d/c with anti-emetic support for marajuana cessation.    1.  Chronic intractable nausea: Because her current flare was precipitated by 17 days of abstinence from marajuana, which resulted in heightened nausea, the most important step at this point is to support her in discontinuing her daily MJ usage. She has tried Phenergan without benefit, and we will discharge on  Reglan 5 mg prn for nausea per consultation with GI and pharm. Here, she has responded well to Zofran, Bentyl, Benadryl, and GI cocktail. There is a risk of EPS and of NMS by combining Zyprexa and Reglan, so we will provide education regarding signs and symptoms of tremor, stiffness, TD, etc. It is our hope that her symptoms will resolve with cessation of MJ. Based on her intermittent asymptomatic periods, CVS is still more likely and can be precipitated by chronic marajuana use. Chronic GERD contributing to nausea and alcoholic gastritis are also worth considering, although she describes no heavy drinking since rehab in 2011. Neg for migraine hx, bhCG neg. Lipase WNL. -  Encourage hot showers -  Counseling to stop mj usage -  HIV Ab pending -  Cont D5 1/2 NS 100 mL/h for now; fluid status improving but emesis ongoing. -  Coenzyme Q10 200 mg bid and L-carnitine 1g bid for prophylaxis have been suggested as prophylaxis with low side effect risk; anecdotal evidence -  Tordol 15 mg/mL q8H prn for pain -  Reglan 5 mg  -  Continue repleting K with Klor-Con; MG wnl. Improved from 2.7 on admission to 3.1 today. -  Advance diet as tolerated  2. Hematemesis: Currently low concern for acute GI bleed, most likely due to mucosal irritation. Given 5-year history of intermittent hematemesis, discussed with GI re: further workup [place] Artist tear vs. PUD vs. Boorhave or perforation--unlikely, nml KUB, no epigastric tenderness, no substernal crepitus). No strong current rec's for EGD or Gastric Emptying study at this time, although if sx continue after MJ cessation, worth considering. No hx of dysphagia, off PPI X 3 years with symptom recurrence. Will get FOBT. -    Continue Protonix 40 mg qD   FOBT  -    Consider outpatient GI f/u pending insurance status  3. Atypical Depression v. Bipolar Disorder:  -  F/u with Dr. Bufford Bartlett, particularly given temporary Reglan 5 mg prescription. -  Continue Zyprexa 15 mg qHS   -  Continue Hydroxyzine 25 mg BID for anxiety  Ppx: Lovanox SubQ Code status: Full Dispo: Pending cessation of emesis and K repletion, anticipated d/c in ~1 day.   This is a Psychologist, occupational Note.  The care of the patient was discussed with Dr. Dimple Casey, and the assessment and plan formulated with their assistance.  Please see their attached note for official documentation of the daily encounter.     Bertha Stakes, Med Student 09/23/2015, 11:14 AM

## 2015-09-23 NOTE — Progress Notes (Signed)
Subjective: Patient continued at least 2 episodes of vomiting this morning but overall improved from previous day. Scant bloody streaking was noted. Passed 1 dark stool that was formed without frank blood. Nausea was improved but not entirely controlled with Zofran. Tolerated liquids but no food throughout the day.  Objective: Vital signs in last 24 hours: Filed Vitals:   09/23/15 0147 09/23/15 0539 09/23/15 1038 09/23/15 1345  BP: 124/81 130/98 122/82 102/65  Pulse: 91 81 81 87  Temp: 98.7 F (37.1 C) 98.5 F (36.9 C) 98.3 F (36.8 C) 97.8 F (36.6 C)  TempSrc: Oral Oral Oral Oral  Resp: Height:      Weight:      SpO2: 100% 36% 100% 100%   Weight change:   Intake/Output Summary (Last 24 hours) at 09/23/15 1450 Last data filed at 09/23/15 1041  Gross per 24 hour  Intake    240 ml  Output      0 ml  Net    240 ml   GENERAL- alert, co-operative, NAD HEENT- PERRL, oral mucosa appears moist, good and intact dentition. CARDIAC- RRR, no murmurs, rubs or gallops. RESP- CTAB, no wheezes or crackles. ABDOMEN- Soft, nontender, no guarding or rebound EXTREMITIES- pulse 2+, symmetric, no pedal edema. SKIN- Warm, dry, No rash or lesion. PSYCH- Normal mood and affect, appropriate thought content and speech.   Lab Results: Basic Metabolic Panel:  Recent Labs Lab 09/22/15 2022 09/23/15 0530  NA 134* 138  K 3.0* 3.1*  CL 102 105  CO2 25 25  GLUCOSE 118* 109*  BUN <5* <5*  CREATININE 0.73 0.78  CALCIUM 7.6* 8.2*  MG 1.9  --    Liver Function Tests:  Recent Labs Lab 09/20/15 1030 09/22/15 1355  AST 38 32  ALT 23 24  ALKPHOS 73 70  BILITOT 0.3 0.7  PROT 8.3* 7.8  ALBUMIN 4.5 4.2    Recent Labs Lab 09/20/15 1030 09/22/15 1355  LIPASE 27 25   No results for input(s): AMMONIA in the last 168 hours. CBC:  Recent Labs Lab 09/20/15 1030 09/22/15 1355  WBC 10.1 11.5*  NEUTROABS 7.8*  --   HGB 13.7 14.1  HCT 39.9 41.0  MCV 95.9 97.6  PLT  232 225   Cardiac Enzymes: No results for input(s): CKTOTAL, CKMB, CKMBINDEX, TROPONINI in the last 168 hours. BNP: No results for input(s): PROBNP in the last 168 hours. D-Dimer: No results for input(s): DDIMER in the last 168 hours. CBG: No results for input(s): GLUCAP in the last 168 hours. Hemoglobin A1C: No results for input(s): HGBA1C in the last 168 hours. Fasting Lipid Panel: No results for input(s): CHOL, HDL, LDLCALC, TRIG, CHOLHDL, LDLDIRECT in the last 168 hours. Thyroid Function Tests: No results for input(s): TSH, T4TOTAL, FREET4, T3FREE, THYROIDAB in the last 168 hours. Coagulation: No results for input(s): LABPROT, INR in the last 168 hours. Anemia Panel: No results for input(s): VITAMINB12, FOLATE, FERRITIN, TIBC, IRON, RETICCTPCT in the last 168 hours. Urine Drug Screen: Drugs of Abuse     Component Value Date/Time   LABOPIA NONE DETECTED 07/31/2012 1513   COCAINSCRNUR NONE DETECTED 07/31/2012 1513   LABBENZ NONE DETECTED 07/31/2012 1513   AMPHETMU NONE DETECTED 07/31/2012 1513   THCU POSITIVE* 07/31/2012 1513   LABBARB NONE DETECTED 07/31/2012 1513    Alcohol Level: No results for input(s): ETH in the last 168 hours. Urinalysis:  Recent Labs Lab 09/20/15 1315 09/22/15 1729  COLORURINE YELLOW YELLOW  LABSPEC  1.015 1.016  PHURINE 8.0 6.5  GLUCOSEU NEGATIVE NEGATIVE  HGBUR SMALL* LARGE*  BILIRUBINUR NEGATIVE NEGATIVE  KETONESUR 15* 15*  PROTEINUR 30* NEGATIVE  UROBILINOGEN 1.0 0.2  NITRITE NEGATIVE NEGATIVE  LEUKOCYTESUR NEGATIVE NEGATIVE    Micro Results: No results found for this or any previous visit (from the past 240 hour(s)). Studies/Results: No results found. Medications: I have reviewed the patient's current medications. Scheduled Meds: . folic acid  1 mg Oral Daily  . ketorolac  15 mg Intravenous 3 times per day  . multivitamin  1 tablet Oral Daily  . OLANZapine  15 mg Oral QHS  . pantoprazole  40 mg Oral Daily  . potassium  chloride  40 mEq Oral BID  . thiamine  100 mg Oral Daily   Continuous Infusions: . dextrose 5 % and 0.45% NaCl 100 mL/hr at 09/22/15 2015   PRN Meds:.metoCLOPramide, ondansetron **OR** ondansetron (ZOFRAN) IV, prochlorperazine, white petrolatum Assessment/Plan: Hyperemesis - Seems to be related to marijuana cessation based on review of history and recently cessation for 17 days. Currently improving since admission, playing golf this point is continued supportive care and hydration. Her fluid intake is improving throughout the day and will likely be safe for home tomorrow. Nausea was inadequately controlled with 4 mg Zofran by mouth. She has tried Phenergan in the past with limited response. We'll start trial of metoclopramide to see if this offers better relief. Scant bloody streaking noted and emesis this morning, we'll obtain FOBT and continue to observe or clinically significant blood loss develops. -Start metoclopramide 5mg  QID PRN -IV NS for fluid resuscitation -Continue to counsel patient on marijuana cessation -Advance diet as tolerated -Check FOBT  Hypokalemia - Secondary to GI losses. Improved from 3.0 to 3.1 today. -Continue replacement 40mEq KCl PO BID  Diet: clears, advance as tolerated DVT ppx: [place] enoxaparin FULL CODE  Dispo: Disposition is deferred at this time, awaiting improvement of current medical problems. Anticipated discharge in approximately 1 day(s).   The patient does not have a current PCP (No Pcp Per Patient) and [facility] after discharge.  The patient does not know have transportation limitations that hinder transportation to clinic appointments.    Fuller Planhristopher W Derry Arbogast, MD 09/23/2015, 2:50 PM

## 2015-09-24 DIAGNOSIS — F319 Bipolar disorder, unspecified: Secondary | ICD-10-CM

## 2015-09-24 LAB — BASIC METABOLIC PANEL
ANION GAP: 9 (ref 5–15)
BUN: <5 mg/dL — ABNORMAL LOW (ref 6–20)
CALCIUM: 9 mg/dL (ref 8.9–10.3)
CHLORIDE: 101 mmol/L (ref 101–111)
CO2: 30 mmol/L (ref 22–32)
Creatinine, Ser: 0.7 mg/dL (ref 0.44–1.00)
GFR calc non Af Amer: >60 mL/min (ref >60)
Glucose, Bld: 109 mg/dL — ABNORMAL HIGH (ref 65–99)
Potassium: 3.1 mmol/L — ABNORMAL LOW (ref 3.5–5.1)
SODIUM: 140 mmol/L (ref 135–145)

## 2015-09-24 LAB — HIV ANTIBODY (ROUTINE TESTING W REFLEX): HIV Screen 4th Generation wRfx: NONREACTIVE

## 2015-09-24 MED ORDER — METOCLOPRAMIDE HCL 10 MG PO TABS: 10.0000 mg | ORAL_TABLET | Freq: Four times a day (QID) | ORAL | Status: DC | PRN

## 2015-09-24 MED ORDER — POTASSIUM CHLORIDE 2 MEQ/ML IV SOLN: Freq: Once | INTRAVENOUS | Status: DC

## 2015-09-24 MED ORDER — NICOTINE 21 MG/24HR TD PT24
21.0000 mg | MEDICATED_PATCH | Freq: Every day | TRANSDERMAL | Status: DC
  Administered 2015-09-24: 21 mg via TRANSDERMAL

## 2015-09-24 MED ORDER — NICOTINE 21 MG/24HR TD PT24: 21.0000 mg | MEDICATED_PATCH | Freq: Every day | TRANSDERMAL | Status: DC

## 2015-09-24 MED ORDER — OMEPRAZOLE 20 MG PO CPDR: 20.0000 mg | DELAYED_RELEASE_CAPSULE | Freq: Every day | ORAL | Status: DC

## 2015-09-24 MED ORDER — POTASSIUM CHLORIDE 10 MEQ/100ML IV SOLN: 10.0000 meq | INTRAVENOUS | Status: AC

## 2015-09-24 MED ORDER — METOCLOPRAMIDE HCL 5 MG PO TABS
5.0000 mg | ORAL_TABLET | Freq: Three times a day (TID) | ORAL | Status: DC
  Administered 2015-09-24 (×2): 5 mg via ORAL
  Filled 2015-09-24 (×3): qty 1

## 2015-09-24 NOTE — Discharge Summary (Signed)
Name: Taylor Bartlett MRN: [id number] DOB: 1986/03/03 29 y.o. PCP: No Pcp Per Patient  Date of Admission: 09/22/2015  2:02 PM Date of Discharge: 09/24/2015 Attending Physician: Ginnie Smart, MD  Discharge Diagnosis: Active Problems:   Cyclic vomiting syndrome   Intractable cyclical vomiting with nausea   Depression   Hypokalemia   Polysubstance abuse   Bipolar affective disorder, currently depressed, mild (HCC)  Discharge Medications:   Medication List    STOP taking these medications        promethazine 25 MG tablet  Commonly known as:  PHENERGAN      TAKE these medications        hydrOXYzine 25 MG capsule  Commonly known as:  VISTARIL  Take 25 mg by mouth 3 (three) times daily as needed for anxiety.     metoCLOPramide 10 MG tablet  Commonly known as:  REGLAN  Take 1 tablet (10 mg total) by mouth every 6 (six) hours as needed for nausea (nausea/headache).     nicotine 21 mg/24hr patch  Commonly known as:  NICODERM CQ - dosed in mg/24 hours  Place 1 patch (21 mg total) onto the skin daily.     OLANZapine 15 MG tablet  Commonly known as:  ZYPREXA  Take 15 mg by mouth at bedtime.     omeprazole 20 MG capsule  Commonly known as:  PRILOSEC  Take 1 capsule (20 mg total) by mouth daily.        Disposition and follow-up:   [name] was discharged from [facility] in Good condition.  At the hospital follow up visit please address:  1.  Vomiting: Patient has a history of several day to week long episodes of nausea and vomiting, most likely related to over the past 2 years. Symptoms were only partially controlled with zofran and phenergan. Given a course of reglan for symptomatic control.  2. Bipolar disorder: Questionably controlled psychiatric disorder that is likely contributing to her chronic marijuana use. She needs appropriate follow up of some nature to monitor adequacy of control on her zyprexa dose.  Follow-up  Appointments:     Follow-up Information    Follow up with Karna Dupes, MD. Go on 10/01/2015.   Specialty:  Internal Medicine   Why:  @ 1:15pm for hospital follow up   Contact information:   [address] [place] [address] [phone]       Discharge Instructions:   Consultations:    Procedures Performed:  Dg Abd Acute W/chest  09/20/2015  CLINICAL DATA:  Nausea and vomiting. EXAM: DG ABDOMEN ACUTE W/ 1V CHEST COMPARISON:  Chest x-ray dated 03/27/2011 and CT scan abdomen dated 04/01/2015 FINDINGS: There is no evidence of dilated bowel loops or free intraperitoneal air. No radiopaque calculi or other significant radiographic abnormality is seen. Heart size and mediastinal contours are within normal limits. Both lungs are clear. Clothing artifact over the left mid lung zone. IMPRESSION: Negative abdominal radiographs.  No acute cardiopulmonary disease. Electronically Signed   By: Francene Boyers M.D.   On: 09/20/2015 10:57    Admission HPI: History of Present Illness: Taylor Bartlett is a 29yo with PMH bipolar disorder who presents with vomiting and generalized abdominal pain that started 3 days ago. She says she smoked "about five blunts" on Saturday as well as a 16oz beer and then was noted to have clear/yellowish vomiting with some specks of blood in it. She also was noted to have generalized abdominal  pain which she describes as squeezing, 10/10 in severity, and non-radiating. She says that vomiting improves her symptoms. She presented on 10/29 with these symptoms, and the antiemetics given did give her partial relief as she said she felt much better yesterday, but the symptoms recurred again this morning which prompted her return. She says these symptoms have occurred "every 3 months for the past 2 years", and last roughly 5-6 days before resolving. She does not have these symptoms in between episodes. She says she occasionally gets short of breath when vomiting, and has had 2  episodes of dark-colored diarrhea but she denies fevers, night sweats, weight loss, chest pain, palpitations, changes in urination, constipation, melena or hematochezia, any rashes, numbness or weakness. She denies any food ingestion a few hours prior, no sick contacts or people with similar symptoms, or recent travel.  Hospital Course by problem list: Cyclical vomiting with nausea Patient treated with one dose of phenergan IV after arrival, later changed onto zofran q6hrs PRN for nausea. Potassium replaced starting at 40 mEq PO BID and improved to 3.1 after 1 day. Vomiting continued on hospital days 1 and 2, but PO fluid intake improved. Scant bloody streaking noted in emesis, CBC was repeated with no anemia appreciated. She was started with Reglan 5mg  TIDAC and tolerating solid diet prior to discharge home.  Discharge Vitals:   BP 140/88 mmHg  Pulse 84  Temp(Src) 98.8 F (37.1 C) (Oral)  Resp 18  Ht 5\' 2"  (1.575 m)  Wt 60.9 kg (134 lb 4.2 oz)  BMI 24.55 kg/m2  SpO2 100%  LMP 09/20/2015 (Exact Date)  Discharge Labs:  Results for orders placed or performed during the hospital encounter of 09/22/15 (from the past 24 hour(s))  HIV antibody     Status: None   Collection Time: 09/23/15  2:50 PM  Result Value Ref Range   HIV Screen 4th Generation wRfx Non Reactive Non Reactive  Basic metabolic panel     Status: Abnormal   Collection Time: 09/24/15  4:40 AM  Result Value Ref Range   Sodium 140 135 - 145 mmol/L   Potassium 3.1 (L) 3.5 - 5.1 mmol/L   Chloride 101 101 - 111 mmol/L   CO2 30 22 - 32 mmol/L   Glucose, Bld 109 (H) 65 - 99 mg/dL   BUN <5 (L) 6 - 20 mg/dL   Creatinine, Ser 1.610.70 0.44 - 1.00 mg/dL   Calcium 9.0 8.9 - 09.610.3 mg/dL   GFR calc non Af Amer >60 >60 mL/min   GFR calc Af Amer >60 >60 mL/min   Anion gap 9 5 - 15    Signed: Fuller Planhristopher W [place] Tozer, MD 09/24/2015, 2:47 PM

## 2015-09-24 NOTE — Progress Notes (Signed)
Pt had N&V after taking meds including the potassium pill.  Pt has c/o abdominal pain.  MD notified and ordered a GI cocktail.  Will continue to monitor.  Estanislado EmmsAshley Schwarz, RN

## 2015-09-24 NOTE — Progress Notes (Signed)
  Date: 09/24/2015  Patient name: Taylor Bartlett  Medical record number: [id number]  Date of birth: Apr 10, 1986   This patient's plan of care was discussed with the house staff. Please see their note for complete details. I concur with their findings.  1. Cycling N/V  Will try scheduled dose of reglan to see if we can improve  Advance diet today  Possible d/c today if diet tolerated.   2. Drug interactions  Her QTc is reviewed (normal, < 460).   Case discussed with pharmacy  Ginnie SmartJeffrey C Hatcher, MD 09/24/2015, 10:47 AM

## 2015-09-24 NOTE — Progress Notes (Signed)
Subjective: [name] reported some nausea in response to Klor-con pills and cranberry juice and 2 episodes of clear-yellow vomit overnight, for a net of 3 emeses yesterday. She noted symptomatic improvement with GI cocktail administered last night. She did not receive any Reglan 5 mg prn, so we scheduled it TID for the duration of her stay, as Zofran 4 mg has continued to inadequately control her symptoms.    Objective:  T 36.6-37.1 HR 61-87 RR 18-20 BP: 102-184/65-103 SpO2 100% on RA  General: Ambulating from the bathroom, trailing some tobacco odor CV: regular rate and rhythm Lungs: Clear to auscultation bilaterally, no wheezes or crackles Abd: Soft, non-tender even to deep palpation, no masses or HSM appreciated; BS present. Neuro: A&OX3   Labs: BMP: sig for K 3.1  HIV Screen: non-reactive.  Assessment/Plan:  [name] is a 29 year old female with a history of bipolar depression and hypertension who is on hospital day 2 for nausea and abdominal pain X 4 days. Her nausea has been inadequately controlled on Zofran, and we will discharge her with a limited course of Reglan 5 mg prn for nausea to support her marijuana cessation. On admission, her QTc was 456--nml for female pts. We have considered her underlying cardiac risk and educated her regarding the risk of EPS and NMS on two anti-dopamine agents, but the net benefit of managing her CVS outweighs the risk of a short course of this nausea management. We anticipate discharge later today pending decreased nausea and toleration of p.o. intake.    1. Chronic intractable nausea: Because her current flare was precipitated by 17 days of abstinence from marajuana, which resulted in heightened nausea, the most important step at this point is to support her in discontinuing her daily MJ usage. She has tried Phenergan without benefit, and we will discharge on Reglan 5 mg prn for nausea per consultation with GI and pharm. Here,  she has responded well to Zofran, Bentyl, Benadryl, and GI cocktail. There is a risk of EPS and of NMS by combining Zyprexa and Reglan, so we will provide education regarding signs and symptoms of tremor, stiffness, TD, etc.  Based on her intermittent asymptomatic periods, CVS is still more likely and can be precipitated by chronic marajuana use. Chronic GERD contributing to nausea and alcoholic gastritis are also worth considering, although she describes no heavy drinking since rehab in 2011. Neg for migraine hx, bhCG neg. Lipase WNL.  - Encourage hot showers at home - Counseling to stop marijuana usage - HIV Ab neg - Cont D5 1/2 NS 100 mL/h for now; fluid status improving but emesis ongoing. 4X2710mEq of Potassium before d/c. - Coenzyme Q10 200 mg bid and L-carnitine 1g bid for prophylaxis have been suggested as prophylaxis with low side effect risk; anecdotal evidence - Tordol 15 mg/mL q8H prn for pain - Reglan 5 mg prn for nausea - Not tolerating K p.o.; 4X10mg /100cc/hr of Potassium before discharge. Unchanged at 3.1 since yesterday; likely s/t emesis, poor oral intake.  - Advance diet as tolerated. -  Nicotine patch started.  2. Hematemesis: Currently low concern for acute GI bleed, most likely due to mucosal irritation. Given 5-year history of intermittent hematemesis, discussed with GI re: further workup Taylor Bartlett(Mallory Weiss tear vs. PUD vs. Boorhave or perforation--unlikely, nml KUB, no epigastric tenderness, no substernal crepitus). No current rec's for EGD or Gastric Emptying study at this time, although if sx continue after MJ cessation, worth considering. No hx of dysphagia, off PPI X 3 years with  symptom recurrence. Bleeding not hemodynamically unstable at present; reports of melena can be followed up outpatient if continued. At present, likely s/t mucosal irritation due to emesis. - Resume Protonix 40 mg qDay. -   Consider FOBT outpatient. - Consider outpatient GI f/u pending  insurance status.  3. Bipolar depression:  - F/u with Dr. Bufford Bartlett, particularly given temporary Reglan 5 mg prescription. - Continue Zyprexa 15 mg qHS  - Continue Hydroxyzine 25 mg BID for anxiety  Ppx: Lovanox SubQ Code status: Full Dispo: Anticipated d/c today, pending resolving nausea and increased po intake. [facility]. Kyung Bartlett at 1:15 pm on 10/01/15.  This is a Psychologist, occupational Note.  The care of the patient was discussed with Dr. Dimple Bartlett, and the assessment and plan formulated with their assistance.  Please see their attached note for official documentation of the daily encounter.   LOS: 1 day   Taylor Bartlett, Med Student 09/24/2015, 11:29 AM

## 2015-09-24 NOTE — Progress Notes (Signed)
Discharge orders received, Pt for discharge home today. IV d/c'd. D/c instructions and RX given with verbalized understanding. Family at bedside to assist patient with discharge. Staff bought pt downstairs via wheelchair. 09/24/15 1603

## 2015-09-24 NOTE — Progress Notes (Signed)
Subjective: 2-3 episodes of vomiting last night but controlled this morning. Of note she did note receive any doses of reglan yesterday due to not requesting PRN medications. GI cocktail significantly improved her symptoms. Potassium still low, with ongoing GI losses and not eating solid foods but drinking good amounts of clear liquids.  Objective: Vital signs in last 24 hours: Filed Vitals:   09/23/15 2239 09/24/15 0124 09/24/15 0608 09/24/15 0924  BP: 164/103 171/99 135/78 138/94  Pulse: 73 67 77 66  Temp: 98.2 F (36.8 C) 98.7 F (37.1 C) 98.5 F (36.9 C) 97.9 F (36.6 C)  TempSrc: Oral Oral Oral Oral  Resp: 20 20 20 18   Height:      Weight:      SpO2: 100% 100% 100% 100%   Weight change:   Intake/Output Summary (Last 24 hours) at 09/24/15 1403 Last data filed at 09/24/15 0122  Gross per 24 hour  Intake      0 ml  Output      2 ml  Net     -2 ml   GENERAL- alert, co-operative, NAD HEENT- PERRL, oral mucosa appears moist, good and intact dentition. CARDIAC- RRR, no murmurs, rubs or gallops. RESP- CTAB, no wheezes or crackles. ABDOMEN- Soft, nontender, no guarding or rebound EXTREMITIES- pulse 2+, symmetric, no pedal edema. SKIN- Warm, dry, No rash or lesion. PSYCH- Normal mood and affect, appropriate thought content and speech.   Lab Results: Basic Metabolic Panel:  Recent Labs Lab 09/22/15 2022 09/23/15 0530 09/24/15 0440  NA 134* 138 140  K 3.0* 3.1* 3.1*  CL 102 105 101  CO2 25 25 30   GLUCOSE 118* 109* 109*  BUN <5* <5* <5*  CREATININE 0.73 0.78 0.70  CALCIUM 7.6* 8.2* 9.0  MG 1.9  --   --    Liver Function Tests:  Recent Labs Lab 09/20/15 1030 09/22/15 1355  AST 38 32  ALT 23 24  ALKPHOS 73 70  BILITOT 0.3 0.7  PROT 8.3* 7.8  ALBUMIN 4.5 4.2    Recent Labs Lab 09/20/15 1030 09/22/15 1355  LIPASE 27 25   No results for input(s): AMMONIA in the last 168 hours. CBC:  Recent Labs Lab 09/20/15 1030 09/22/15 1355  WBC 10.1 11.5*    NEUTROABS 7.8*  --   HGB 13.7 14.1  HCT 39.9 41.0  MCV 95.9 97.6  PLT 232 225   Cardiac Enzymes: No results for input(s): CKTOTAL, CKMB, CKMBINDEX, TROPONINI in the last 168 hours. BNP: No results for input(s): PROBNP in the last 168 hours. D-Dimer: No results for input(s): DDIMER in the last 168 hours. CBG: No results for input(s): GLUCAP in the last 168 hours. Hemoglobin A1C: No results for input(s): HGBA1C in the last 168 hours. Fasting Lipid Panel: No results for input(s): CHOL, HDL, LDLCALC, TRIG, CHOLHDL, LDLDIRECT in the last 168 hours. Thyroid Function Tests: No results for input(s): TSH, T4TOTAL, FREET4, T3FREE, THYROIDAB in the last 168 hours. Coagulation: No results for input(s): LABPROT, INR in the last 168 hours. Anemia Panel: No results for input(s): VITAMINB12, FOLATE, FERRITIN, TIBC, IRON, RETICCTPCT in the last 168 hours. Urine Drug Screen: Drugs of Abuse     Component Value Date/Time   LABOPIA NONE DETECTED 07/31/2012 1513   COCAINSCRNUR NONE DETECTED 07/31/2012 1513   LABBENZ NONE DETECTED 07/31/2012 1513   AMPHETMU NONE DETECTED 07/31/2012 1513   THCU POSITIVE* 07/31/2012 1513   LABBARB NONE DETECTED 07/31/2012 1513    Alcohol Level: No results for input(s):  ETH in the last 168 hours. Urinalysis:  Recent Labs Lab 09/20/15 1315 09/22/15 1729  COLORURINE YELLOW YELLOW  LABSPEC 1.015 1.016  PHURINE 8.0 6.5  GLUCOSEU NEGATIVE NEGATIVE  HGBUR SMALL* LARGE*  BILIRUBINUR NEGATIVE NEGATIVE  KETONESUR 15* 15*  PROTEINUR 30* NEGATIVE  UROBILINOGEN 1.0 0.2  NITRITE NEGATIVE NEGATIVE  LEUKOCYTESUR NEGATIVE NEGATIVE    Micro Results: No results found for this or any previous visit (from the past 240 hour(s)). Studies/Results: No results found. Medications: I have reviewed the patient's current medications. Scheduled Meds: . folic acid  1 mg Oral Daily  . ketorolac  15 mg Intravenous 3 times per day  . metoCLOPramide  5 mg Oral TID AC  .  multivitamin  1 tablet Oral Daily  . nicotine  21 mg Transdermal Daily  . OLANZapine  15 mg Oral QHS  . pantoprazole  40 mg Oral Daily  . potassium chloride  10 mEq Intravenous Q1 Hr x 4  . potassium chloride  40 mEq Oral BID  . thiamine  100 mg Oral Daily   Continuous Infusions:   PRN Meds:.ondansetron **OR** ondansetron (ZOFRAN) IV, prochlorperazine, white petrolatum Assessment/Plan: Hyperemesis - Still only partially controlled last night, but no metoclopramide actually given. Tolerates fluids well but has not eaten solid foods concerning in setting of hypokalemia. Otherwise not feeling significantly weak, abdominal pain is easily controlled, dehydration is not a major risk factor at this point. Patient again recommended to wean marijuana use. Alcohol and tobacco use also may be contributory especially with improvement from GI cocktail, however she is already on PPI long term. Scant bloody streaking in emesis not hemodynamically significant on labs or vital signs. Would be appropriate to follow up outpatient if her vomiting is protracted. -Schedule metoclopramide  QID TIDWM -Continue to counsel patient on marijuana cessation -Advance to soft diet -Protonix  PO -May need to hold toradol in setting of abdominal pain, small hematemesis  Hypokalemia - Secondary to GI losses and poor PO intake. Unchanged at 3.1 today. Not currently symptomatic with weakness and no EKG changes observed at a lower K on admission. -Continue replacement KCl PO BID -Add 40 mEq IV with her D51/2NS  Bipolar Disorder/Depression - Continue home zyprexa  qHS  Tobacco use - Nicotine patch provided starting today  Diet: soft diet DVT ppx: [place] enoxaparin FULL CODE  Dispo: Anticipated discharge to home later today if symptoms continue improving.  The patient does not have a current PCP (No Pcp Per Patient) and does need an [facility] hospital follow-up appointment after discharge.  The patient does not  know have transportation limitations that hinder transportation to clinic appointments.  LOS: 1 day   Fuller Plan, MD 09/24/2015, 2:03 PM

## 2015-09-24 NOTE — Discharge Instructions (Addendum)
Your symptoms are likely related to a combination of changes to routine habits including marijuana and tobacco use, as well as to irritation of the stomach and esophagus due to stomach acid from reflux.  Take Prilosec 20mg  once daily for acid reflux treatment. Take Reglan with meals as needed up to per 6 hours for relief of nausea. Try to start taking when you're losing your appetite to ward off vomiting. Do not take Phenergan and Reglan together for nausea. Because you are also taking Zyprexa--which also has a similar action in your body--you are at slightly increased risk of side effects, including muscle rigidity, tremor, or fever. If any of these develop, please stop and call the clinic or report to an Urgent Care or ED.   The following over-the-counter supplements might help with Cyclic Vomiting Syndrome and should have no significant side effects: Coenzyme Q10 200 mg and L-carnitine 1g, taken by mouth twice a day.    If your symptoms worsen significantly or you feel worse from taking medications please stop and call the clinic for additional instructions or to be changed to a different treatment.

## 2015-09-24 NOTE — Care Management Note (Signed)
Case Management Note  Patient Details  Name: Taylor Bartlett MRN: [id number] Date of Birth: 1986/10/05  Subjective/Objective:                    Action/Plan: CM met with the patient to discuss not having a PCP and availability to meds. Patient states she is going to be following up in the [facility] clinic at discharge. CM asked about her ability to obtain her prescribed medications. Patient states she has no problems obtaining her meds. No further needs per CM.   Expected Discharge Date:                  Expected Discharge Plan:  Home/Self Care  In-House Referral:     Discharge planning Services     Post Acute Care Choice:    Choice offered to:     DME Arranged:    DME Agency:     HH Arranged:    HH Agency:     Status of Service:  In process, will continue to follow  Medicare Important Message Given:    Date Medicare IM Given:    Medicare IM give by:    Date Additional Medicare IM Given:    Additional Medicare Important Message give by:     If discussed at [place] of Stay Meetings, dates discussed:    Additional Comments:  Pollie Friar, RN 09/24/2015, 2:29 PM

## 2015-10-01 ENCOUNTER — Ambulatory Visit: Payer: Self-pay | Admitting: Internal Medicine

## 2015-10-26 ENCOUNTER — Encounter (HOSPITAL_COMMUNITY): Payer: Self-pay | Admitting: Emergency Medicine

## 2015-10-26 ENCOUNTER — Emergency Department (HOSPITAL_COMMUNITY): Payer: No Typology Code available for payment source

## 2015-10-26 ENCOUNTER — Emergency Department (HOSPITAL_COMMUNITY)
Admission: EM | Admit: 2015-10-26 | Discharge: 2015-10-26 | Disposition: A | Discharge status: Home / Self Care | Payer: No Typology Code available for payment source | Attending: Emergency Medicine | Admitting: Emergency Medicine

## 2015-10-26 DIAGNOSIS — S41112A Laceration without foreign body of left upper arm, initial encounter: Secondary | ICD-10-CM

## 2015-10-26 DIAGNOSIS — Z79899 Other long term (current) drug therapy: Secondary | ICD-10-CM | POA: Diagnosis not present

## 2015-10-26 DIAGNOSIS — K219 Gastro-esophageal reflux disease without esophagitis: Secondary | ICD-10-CM | POA: Insufficient documentation

## 2015-10-26 DIAGNOSIS — F17210 Nicotine dependence, cigarettes, uncomplicated: Secondary | ICD-10-CM | POA: Diagnosis not present

## 2015-10-26 DIAGNOSIS — I10 Essential (primary) hypertension: Secondary | ICD-10-CM | POA: Diagnosis not present

## 2015-10-26 DIAGNOSIS — F419 Anxiety disorder, unspecified: Secondary | ICD-10-CM | POA: Insufficient documentation

## 2015-10-26 DIAGNOSIS — Y92410 Unspecified street and highway as the place of occurrence of the external cause: Secondary | ICD-10-CM | POA: Insufficient documentation

## 2015-10-26 DIAGNOSIS — Y998 Other external cause status: Secondary | ICD-10-CM | POA: Insufficient documentation

## 2015-10-26 DIAGNOSIS — S50812A Abrasion of left forearm, initial encounter: Secondary | ICD-10-CM | POA: Diagnosis not present

## 2015-10-26 DIAGNOSIS — Y9389 Activity, other specified: Secondary | ICD-10-CM | POA: Insufficient documentation

## 2015-10-26 DIAGNOSIS — S0081XA Abrasion of other part of head, initial encounter: Secondary | ICD-10-CM | POA: Diagnosis not present

## 2015-10-26 DIAGNOSIS — Z23 Encounter for immunization: Secondary | ICD-10-CM | POA: Insufficient documentation

## 2015-10-26 MED ORDER — ONDANSETRON HCL 4 MG PO TABS
4.0000 mg | ORAL_TABLET | Freq: Once | ORAL | Status: AC
  Administered 2015-10-26: 4 mg via ORAL
  Filled 2015-10-26: qty 1

## 2015-10-26 MED ORDER — IBUPROFEN 800 MG PO TABS
800.0000 mg | ORAL_TABLET | Freq: Once | ORAL | Status: AC
  Administered 2015-10-26: 800 mg via ORAL
  Filled 2015-10-26: qty 1

## 2015-10-26 MED ORDER — TETANUS-DIPHTH-ACELL PERTUSSIS 5-2.5-18.5 LF-MCG/0.5 IM SUSP
0.5000 mL | Freq: Once | INTRAMUSCULAR | Status: AC
  Administered 2015-10-26: 0.5 mL via INTRAMUSCULAR
  Filled 2015-10-26: qty 0.5

## 2015-10-26 NOTE — ED Notes (Signed)
Pt had episode of vomiting x 1. Emesis was clear, small amount. Pt given ginger ale, saltine crackers. PA to bedside to assess patient.

## 2015-10-26 NOTE — Discharge Instructions (Signed)
Laceration Care, Adult  A laceration is a cut that goes through all layers of the skin. The cut also goes into the tissue that is right under the skin. Some cuts heal on their own. Others need to be closed with stitches (sutures), staples, skin adhesive strips, or wound glue. Taking care of your cut lowers your risk of infection and helps your cut to heal better.  HOW TO TAKE CARE OF YOUR CUT  For stitches or staples:  · Keep the wound clean and dry.  · If you were given a bandage (dressing), you should change it at least one time per day or as told by your doctor. You should also change it if it gets wet or dirty.  · Keep the wound completely dry for the first 24 hours or as told by your doctor. After that time, you may take a shower or a bath. However, make sure that the wound is not soaked in water until after the stitches or staples have been removed.  · Clean the wound one time each day or as told by your doctor:    Wash the wound with soap and water.    Rinse the wound with water until all of the soap comes off.    Pat the wound dry with a clean towel. Do not rub the wound.  · After you clean the wound, put a thin layer of antibiotic ointment on it as told by your doctor. This ointment:    Helps to prevent infection.    Keeps the bandage from sticking to the wound.  · Have your stitches or staples removed as told by your doctor.  If your doctor used skin adhesive strips:   · Keep the wound clean and dry.  · If you were given a bandage, you should change it at least one time per day or as told by your doctor. You should also change it if it gets dirty or wet.  · Do not get the skin adhesive strips wet. You can take a shower or a bath, but be careful to keep the wound dry.  · If the wound gets wet, pat it dry with a clean towel. Do not rub the wound.  · Skin adhesive strips fall off on their own. You can trim the strips as the wound heals. Do not remove any strips that are still stuck to the wound. They will  fall off after a while.  If your doctor used wound glue:  · Try to keep your wound dry, but you may briefly wet it in the shower or bath. Do not soak the wound in water, such as by swimming.  · After you take a shower or a bath, gently pat the wound dry with a clean towel. Do not rub the wound.  · Do not do any activities that will make you really sweaty until the skin glue has fallen off on its own.  · Do not apply liquid, cream, or ointment medicine to your wound while the skin glue is still on.  · If you were given a bandage, you should change it at least one time per day or as told by your doctor. You should also change it if it gets dirty or wet.  · If a bandage is placed over the wound, do not let the tape for the bandage touch the skin glue.  · Do not pick at the glue. The skin glue usually stays on for 5-10 days. Then, it   or when wound glue stays in place and the wound is healed. Make sure to wear a sunscreen of at least 30 SPF.  Take over-the-counter and prescription medicines only as told by your doctor.  If you were given antibiotic medicine or ointment, take or apply it as told by your doctor. Do not stop using the antibiotic even if your wound is getting better.  Do not scratch or pick at the wound.  Keep all follow-up visits as told by your doctor. This is important.  Check your wound every day for signs of infection. Watch for:  Redness, swelling, or pain.  Fluid, blood, or pus.  Raise (elevate) the injured area above the level of your heart while you are sitting or lying down, if possible. GET HELP IF:  You got a tetanus shot and you have any of these problems at the injection site:  Swelling.  Very bad pain.  Redness.  Bleeding.  You have a fever.  A wound that was  closed breaks open.  You notice a bad smell coming from your wound or your bandage.  You notice something coming out of the wound, such as wood or glass.  Medicine does not help your pain.  You have more redness, swelling, or pain at the site of your wound.  You have fluid, blood, or pus coming from your wound.  You notice a change in the color of your skin near your wound.  You need to change the bandage often because fluid, blood, or pus is coming from the wound.  You start to have a new rash.  You start to have numbness around the wound. GET HELP RIGHT AWAY IF:  You have very bad swelling around the wound.  Your pain suddenly gets worse and is very bad.  You notice painful lumps near the wound or on skin that is anywhere on your body.  You have a red streak going away from your wound.  The wound is on your hand or foot and you cannot move a finger or toe like you usually can.  The wound is on your hand or foot and you notice that your fingers or toes look pale or bluish.   This information is not intended to replace advice given to you by your health care provider. Make sure you discuss any questions you have with your health care provider.   Document Released: 04/26/2008 Document Revised: 03/25/2015 Document Reviewed: 11/04/2014 Elsevier Interactive Patient Education 2016 [employer]  Nonsutured Laceration Care A laceration is a cut that goes through all layers of the skin and extends into the tissue that is right under the skin. This type of cut is usually stitched up (sutured) or closed with tape (adhesive strips) or skin glue shortly after the injury happens. However, if the wound is dirty or if several hours pass before medical treatment is provided, it is likely that germs (bacteria) will enter the wound. Closing a laceration after bacteria have entered it increases the risk of infection. In these cases, your health care provider may leave the laceration open  (nonsutured) and cover it with a bandage. This type of treatment helps prevent infection and allows the wound to heal from the deepest layer of tissue damage up to the surface. An open fracture is a type of injury that may involve nonsutured lacerations. An open fracture is a break in a bone that happens along with one or more lacerations through the skin that is near the fracture site. HOW  TO CARE FOR YOUR NONSUTURED LACERATION  Take or apply over-the-counter and prescription medicines only as told by your health care provider.  If you were prescribed an antibiotic medicine, take or apply it as told by your health care provider. Do not stop using the antibiotic even if your condition improves.  Clean the wound one time each day or as told by your health care provider.  Wash the wound with mild soap and water.  Rinse the wound with water to remove all soap.  Pat your wound dry with a clean towel. Do not rub the wound.  Do not inject anything into the wound unless your health care provider told you to.  Change any bandages (dressings) as told by your health care provider. This includes changing the dressing if it gets wet, dirty, or starts to smell bad.  Keep the dressing dry until your health care provider says it can be removed. Do not take baths, swim, or do anything that puts your wound underwater until your health care provider approves.  Raise (elevate) the injured area above the level of your heart while you are sitting or lying down, if possible.  Do not scratch or pick at the wound.  Check your wound every day for signs of infection. Watch for:  Redness, swelling, or pain.  Fluid, blood, or pus.  Keep all follow-up visits as told by your health care provider. This is important. SEEK MEDICAL CARE IF:  You received a tetanus and shot and you have swelling, severe pain, redness, or bleeding at the injection site.   You have a fever.  Your pain is not controlled with  medicine.  You have increased redness, swelling, or pain at the site of your wound.  You have fluid, blood, or pus coming from your wound.  You notice a bad smell coming from your wound or your dressing.  You notice something coming out of the wound, such as wood or glass.  You notice a change in the color of your skin near your wound.  You develop a new rash.  You need to change the dressing frequently due to fluid, blood, or pus draining from the wound.  You develop numbness around your wound. SEEK IMMEDIATE MEDICAL CARE IF:  Your pain suddenly increases and is severe.  You develop severe swelling around the wound.  The wound is on your hand or foot and you cannot properly move a finger or toe.  The wound is on your hand or foot and you notice that your fingers or toes look pale or bluish.  You have a red streak going away from your wound.   This information is not intended to replace advice given to you by your health care provider. Make sure you discuss any questions you have with your health care provider.   Apply ice to affected area. You may feel more sore tomorrow than today. Take ibuprofen as needed for pain. Return to the Emergency Department if you experience worsening of your symptoms, severe abdominal or chest pain, loss of consciousness.

## 2015-10-26 NOTE — ED Notes (Signed)
Patient transported to X-ray 

## 2015-10-26 NOTE — ED Notes (Signed)
Patient transported to CT 
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
## 2015-10-26 NOTE — ED Notes (Signed)
Dr Patria Maneampos assessed c-spine/neck and removed C collar.
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
## 2015-11-02 ENCOUNTER — Emergency Department (HOSPITAL_COMMUNITY)
Admission: EM | Admit: 2015-11-02 | Discharge: 2015-11-02 | Disposition: A | Discharge status: Home / Self Care | Payer: No Typology Code available for payment source | Attending: Emergency Medicine | Admitting: Emergency Medicine

## 2015-11-02 ENCOUNTER — Emergency Department (HOSPITAL_COMMUNITY): Payer: No Typology Code available for payment source

## 2015-11-02 ENCOUNTER — Encounter (HOSPITAL_COMMUNITY): Payer: Self-pay

## 2015-11-02 DIAGNOSIS — F17210 Nicotine dependence, cigarettes, uncomplicated: Secondary | ICD-10-CM | POA: Diagnosis not present

## 2015-11-02 DIAGNOSIS — Z79899 Other long term (current) drug therapy: Secondary | ICD-10-CM | POA: Diagnosis not present

## 2015-11-02 DIAGNOSIS — M25512 Pain in left shoulder: Secondary | ICD-10-CM | POA: Diagnosis not present

## 2015-11-02 DIAGNOSIS — F419 Anxiety disorder, unspecified: Secondary | ICD-10-CM | POA: Diagnosis not present

## 2015-11-02 DIAGNOSIS — K219 Gastro-esophageal reflux disease without esophagitis: Secondary | ICD-10-CM | POA: Insufficient documentation

## 2015-11-02 DIAGNOSIS — I10 Essential (primary) hypertension: Secondary | ICD-10-CM | POA: Diagnosis not present

## 2015-11-02 MED ORDER — IBUPROFEN 800 MG PO TABS: 800.0000 mg | ORAL_TABLET | Freq: Three times a day (TID) | ORAL | Status: DC

## 2015-11-02 MED ORDER — KETOROLAC TROMETHAMINE 60 MG/2ML IM SOLN
60.0000 mg | Freq: Once | INTRAMUSCULAR | Status: AC
  Administered 2015-11-02: 60 mg via INTRAMUSCULAR
  Filled 2015-11-02: qty 2

## 2015-11-02 NOTE — Discharge Instructions (Signed)
You have been seen today for shoulder pain. Your imaging showed no abnormalities. Follow up with PCP as needed. Return to ED should symptoms worsen.   Emergency Department Resource Guide 1) Find a Doctor and Pay Out of Pocket Although you won't have to find out who is covered by your insurance plan, it is a good idea to ask around and get recommendations. You will then need to call the office and see if the doctor you have chosen will accept you as a new patient and what types of options they offer for patients who are self-pay. Some doctors offer discounts or will set up payment plans for their patients who do not have insurance, but you will need to ask so you aren't surprised when you get to your appointment.  2) Contact Your Local Health Department Not all health departments have doctors that can see patients for sick visits, but many do, so it is worth a call to see if yours does. If you don't know where your local health department is, you can check in your phone book. The CDC also has a tool to help you locate your state's health department, and many state websites also have listings of all of their local health departments.  3) Find a Walk-in Clinic If your illness is not likely to be very severe or complicated, you may want to try a walk in clinic. These are popping up all over the country in pharmacies, drugstores, and shopping centers. They're usually staffed by nurse practitioners or physician assistants that have been trained to treat common illnesses and complaints. They're usually fairly quick and inexpensive. However, if you have serious medical issues or chronic medical problems, these are probably not your best option.  No Primary Care Doctor: - Call Health Connect at  [phone] - they can help you locate a primary care doctor that  accepts your insurance, provides certain services, etc. - Physician Referral Service- [phone]  Chronic Pain Problems: Organization          Address  Phone   Notes  [facility]  [phone] Patients need to be referred by their primary care doctor.   Medication Assistance: Organization         Address  Phone   Notes  [facility] [phone] [address] [place], [place] [address] [phone] --Must be a resident of [place] [place] -- Must have NO insurance coverage whatsoever (no Medicaid/ Medicare, etc.) -- The pt. MUST have a primary care doctor that directs their care regularly and follows them in the community   MedAssist  [phone]   [employer]  [phone]    Agencies that provide inexpensive medical care: Organization         Address  Phone   Notes  [facility]  [phone]   [facility]    [phone]   [facility] [address] [place], [place] [address] [phone]   [facility] [address], [place] [phone]   [facility]    [phone]) [phone]   [facility]    [phone]   [facility]  [address], [place] Phone:  [phone], Fax:  [phone] Hours of Operation:  9 am - 6 pm, M-F.  Also accepts Medicaid/Medicare and self-pay.  [facility]  [address], [place]  Phone: [phone], Fax: [phone]. Hours of Operation:  8:30 am - 5:30 pm, M-F.  Also accepts Medicaid and self-pay.  °[facility] [place] [address], [place] Phone: ([phone]) [phone]   °[facility] [address], [place], [place] ([phone], Ext. 123 Mondays & Thursdays: 7-9 AM.  First 15 patients are seen on a first come, first serve basis. °  ° °Medicaid-accepting [place] Providers: ° °Organization         Address  Phone   Notes  °[facility] [address], [place] [phone] Also accepts self-pay patients.  °[facility] [address], [place] ° ([phone]) [phone]   °[facility] [address], [place] [phone]) [phone]   °[facility] [address] [place] [address], [place] ([phone]) [phone]   °Veita Bland [address], [place]  ° ([phone]) [phone] Only accepts [place] Access Medicaid patients after they have their name applied to their card.  ° °Self-Pay (no insurance) in [place]: ° °Organization         Address  Phone   Notes  °Sickle Cell Patients, [facility] [address], [place] ([phone]) [phone]   °[facility] [address], [place] ([phone]) [phone]   °[facility] ° [address], [place] ([phone]) [phone]   °[facility]/Dr. Osei-Bonsu ° [address] [place] [address], [place] or [address], [place] [phone]) [phone] Phone number for both [place] and [place] locations is the same.  °[facility] [address], [place] ([phone]) [phone]   °[facility] [address] [place] [address], [place] or [address] ([phone]) [phone] °(336) [phone]   °[facility] [address], [place] ([phone]) [phone], phone; [phone], fax Sees patients 1st and 3rd Saturday of every month.  Must not qualify for public or private insurance (i.e. Medicaid, Medicare, [place] Health Choice, Veterans' Benefits) • Household income should be no more than 200% of the poverty level •The clinic cannot treat you if you are pregnant or think you are pregnant • Sexually transmitted diseases are not treated at the clinic.  ° ° °Dental Care: °Organization         Address  Phone  Notes  °[place] [facility] [address], [place] ([phone]) [phone] Accepts children up to age 21 who are enrolled in Medicaid or [place] Health Choice; pregnant women with a Medicaid card; and  children who have applied for Medicaid or [place] Health Choice, but were declined, whose parents can pay a reduced fee at time of service.  °[place] [facility] [place]  [address], [place] ([phone]) [phone] Accepts children up to age 21 who are enrolled in Medicaid or [place] Health Choice; pregnant women with a Medicaid card; and children who have applied for Medicaid or  Health Choice, but were declined, whose parents can pay a reduced fee at time of service.  °[facility] ° [address],  ([phone]) [phone] Patients are seen by appointment only. Walk-ins are not accepted. [facility] will see patients 18 years of age and older. °Monday - Tuesday (8am-5pm) °Most Wednesdays (8:30-5pm) °$30 per visit, cash only  °[facility] ° [address], [facility] [phone] Patients are seen by appointment only. Walk-ins are not accepted. [place] will see patients 23 years of age and older. One Wednesday Evening (Monthly: Volunteer Based).  $30 per visit, cash only  [place]  [phone] for adults; Children under age 68, call Graduate Pediatric Dentistry at [phone]. Children aged 23-14, please call [phone] to request a pediatric application.  Dental services are provided in all areas of dental care including fillings, crowns and bridges, complete and partial dentures, implants, gum treatment, root canals, and extractions. Preventive care is also provided. Treatment is provided to both adults and children. Patients are selected via a lottery and there is often a waiting list.   [facility] [address] [place]  [phone] [email or web address]   [facility] [address] [place], [place] [phone], Ext. [phone] Second and Fourth Thursday of each month, opens at 6:30 AM; Clinic ends at 9 AM.  Patients are seen on a first-come first-served  basis, and a limited number are seen during each clinic.   [facility]  [address] Hillard Danker [place], [place] [phone]   Eligibility Requirements You must have lived in [place], [place], or [place] counties for at least the last three months.   You cannot be eligible for state or federal sponsored [employer], including [employer], [place], or [employer].   You generally cannot be eligible for healthcare insurance through your employer.    How to apply: Eligibility screenings are held every Tuesday and Wednesday afternoon from 1:00 pm until 4:00 pm. You do not need an appointment for the interview!  [facility] [address], [place], [place]   [place] Department  [place]  [phone]    Behavioral Health Resources in the Community: Intensive Outpatient Programs Organization         Address  Phone  Notes  [facility] [address], [place], [place] [phone]   [facility] [address] [place], [place]   [facility] [address] [place], [place]   [place] [address]  [place], [place] or [phone]   Substance Abuse Resources Organization         Address  Phone  Notes  [facility]  [phone]) [phone]   [place]  ([phone]) [phone]   [facility] [place]   Chinita Pester  ([phone]   Residential & Outpatient Substance Abuse Program  (978)[phone]   Psychological Services Organization         Address  Phone  Notes  [facility] [place]  [place]  (604) [phone]   [place] [address], [place] or [phone]    Mobile Crisis Teams Organization          Address  Phone  Notes  [facility], Mobile Crisis Care Unit  [phone]   [facility]  [address] [place] [employer], [place]   [place] [address] [place] [phone]    Self-Help/Support Groups Organization         Address  Phone             Notes  [place] [facility] [place] - variety of  support groups  336- [phone] Call for more information  °Narcotics Anonymous (NA), [facility] [address], °[place]  2 meetings at this location  ° °Residential Treatment Programs °Organization         Address  Phone  Notes  °[facility] [address],    °[place]  [phone]   °[facility] ° [address], [place], [place] [phone]   °[facility] [address], [place] [phone] Admissions: 8am-3pm M-F  °[facility] [address]    °[place], [place] [phone]   °[facility] [address], [place], [place] [phone]   °[facility] [address]  °[place], [place] [phone]   °[facility] - Intensive Outpatient [address], [place], [place] [phone]   °[facility] [address]  °[place], [place] 1-877-615-2722 or 336-784-9470   °[facility] ([facility]) [address] [place], [place] [phone] Accepts Medicaid  °[facility] [address]  °[place] 1-800-659-3381 Substance Abuse/Addiction Treatment  ° °[place] Behavioral Health Resources °Organization         Address  Phone  Notes  °[facility]  (888) [phone]   °Julie Brannon, PhD [address] [place], [place]   (336) [phone] or (336) 951-0000   °[facility]   [address] °[place], [place] (336) 349-4454   °[facility] [address], [place], [place] (336) [phone] Insurance/Medicaid/sponsorship  through [facility]  °[facility] [address]                                    [place], [place] (336) [phone] Therapy/tele-psych/case  °[facility] [address]  ° [place], [place] (336) [phone]    °Dr. Arfeen  (336) [phone]   °[facility] [place]  [employer] [place] [facility] 1) [address], [place] °2) [address], [place] °3)  [address], [place] (336) [phone] °(336) [phone] ° °(336) [phone]   °[place] Child Abuse Hotline (336) 342-1394 or (336) [phone] (After Hours)    ° ° ° °
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
## 2015-11-02 NOTE — ED Notes (Signed)
Declined W/C at D/C and was escorted to lobby by RN. 

## 2016-01-24 ENCOUNTER — Emergency Department (HOSPITAL_COMMUNITY)
Admission: EM | Admit: 2016-01-24 | Discharge: 2016-01-25 | Disposition: A | Discharge status: Home / Self Care | Payer: No Typology Code available for payment source | Attending: Emergency Medicine | Admitting: Emergency Medicine

## 2016-01-24 ENCOUNTER — Encounter (HOSPITAL_COMMUNITY): Payer: Self-pay | Admitting: Family Medicine

## 2016-01-24 DIAGNOSIS — Z3202 Encounter for pregnancy test, result negative: Secondary | ICD-10-CM | POA: Insufficient documentation

## 2016-01-24 DIAGNOSIS — F419 Anxiety disorder, unspecified: Secondary | ICD-10-CM | POA: Insufficient documentation

## 2016-01-24 DIAGNOSIS — Z791 Long term (current) use of non-steroidal anti-inflammatories (NSAID): Secondary | ICD-10-CM | POA: Insufficient documentation

## 2016-01-24 DIAGNOSIS — R1084 Generalized abdominal pain: Secondary | ICD-10-CM | POA: Insufficient documentation

## 2016-01-24 DIAGNOSIS — K219 Gastro-esophageal reflux disease without esophagitis: Secondary | ICD-10-CM | POA: Insufficient documentation

## 2016-01-24 DIAGNOSIS — R112 Nausea with vomiting, unspecified: Secondary | ICD-10-CM | POA: Insufficient documentation

## 2016-01-24 DIAGNOSIS — I10 Essential (primary) hypertension: Secondary | ICD-10-CM | POA: Insufficient documentation

## 2016-01-24 DIAGNOSIS — Z79899 Other long term (current) drug therapy: Secondary | ICD-10-CM | POA: Insufficient documentation

## 2016-01-24 DIAGNOSIS — R197 Diarrhea, unspecified: Secondary | ICD-10-CM | POA: Insufficient documentation

## 2016-01-24 DIAGNOSIS — F17210 Nicotine dependence, cigarettes, uncomplicated: Secondary | ICD-10-CM | POA: Insufficient documentation

## 2016-01-24 LAB — COMPREHENSIVE METABOLIC PANEL
ALBUMIN: 4.4 g/dL (ref 3.5–5.0)
ALK PHOS: 63 U/L (ref 38–126)
ALT: 14 U/L (ref 14–54)
AST: 31 U/L (ref 15–41)
Anion gap: 11 (ref 5–15)
BILIRUBIN TOTAL: 0.8 mg/dL (ref 0.3–1.2)
BUN: 7 mg/dL (ref 6–20)
CALCIUM: 9.8 mg/dL (ref 8.9–10.3)
CO2: 25 mmol/L (ref 22–32)
CREATININE: 0.95 mg/dL (ref 0.44–1.00)
Chloride: 105 mmol/L (ref 101–111)
GFR calc Af Amer: >60 mL/min (ref >60)
GLUCOSE: 113 mg/dL — AB (ref 65–99)
Potassium: 4.5 mmol/L (ref 3.5–5.1)
Sodium: 141 mmol/L (ref 135–145)
TOTAL PROTEIN: 7.6 g/dL (ref 6.5–8.1)

## 2016-01-24 LAB — URINE MICROSCOPIC-ADD ON

## 2016-01-24 LAB — URINALYSIS, ROUTINE W REFLEX MICROSCOPIC
BILIRUBIN URINE: NEGATIVE
Glucose, UA: NEGATIVE mg/dL
KETONES UR: NEGATIVE mg/dL
LEUKOCYTES UA: NEGATIVE
NITRITE: NEGATIVE
PH: 7 (ref 5.0–8.0)
PROTEIN: NEGATIVE mg/dL
Specific Gravity, Urine: 1.017 (ref 1.005–1.030)

## 2016-01-24 LAB — CBC
HEMATOCRIT: 40.3 % (ref 36.0–46.0)
Hemoglobin: 13.4 g/dL (ref 12.0–15.0)
MCH: 31.9 pg (ref 26.0–34.0)
MCHC: 33.3 g/dL (ref 30.0–36.0)
MCV: 96 fL (ref 78.0–100.0)
PLATELETS: 238 10*3/uL (ref 150–400)
RBC: 4.2 MIL/uL (ref 3.87–5.11)
RDW: 12.9 % (ref 11.5–15.5)
WBC: 8.6 10*3/uL (ref 4.0–10.5)

## 2016-01-24 LAB — PREGNANCY, URINE: PREG TEST UR: NEGATIVE

## 2016-01-24 LAB — LIPASE, BLOOD: LIPASE: 25 U/L (ref 11–51)

## 2016-01-24 MED ORDER — ONDANSETRON HCL 4 MG/2ML IJ SOLN
4.0000 mg | Freq: Once | INTRAMUSCULAR | Status: AC
  Administered 2016-01-24: 4 mg via INTRAVENOUS
  Filled 2016-01-24: qty 2

## 2016-01-24 MED ORDER — DIPHENHYDRAMINE HCL 50 MG/ML IJ SOLN
25.0000 mg | Freq: Once | INTRAMUSCULAR | Status: AC
  Administered 2016-01-24: 25 mg via INTRAVENOUS
  Filled 2016-01-24: qty 1

## 2016-01-24 MED ORDER — LORAZEPAM 2 MG/ML IJ SOLN
1.0000 mg | Freq: Once | INTRAMUSCULAR | Status: AC
Start: 2016-01-24 — End: 2016-01-24
  Administered 2016-01-24: 1 mg via INTRAVENOUS
  Filled 2016-01-24: qty 1

## 2016-01-24 MED ORDER — HALOPERIDOL LACTATE 5 MG/ML IJ SOLN
2.0000 mg | Freq: Once | INTRAMUSCULAR | Status: AC
  Administered 2016-01-24: 2 mg via INTRAMUSCULAR
  Filled 2016-01-24: qty 1

## 2016-01-24 MED ORDER — SODIUM CHLORIDE 0.9 % IV BOLUS (SEPSIS)
1000.0000 mL | Freq: Once | INTRAVENOUS | Status: AC
  Administered 2016-01-24: 1000 mL via INTRAVENOUS

## 2016-01-24 MED ORDER — METOCLOPRAMIDE HCL 5 MG/ML IJ SOLN
10.0000 mg | Freq: Once | INTRAMUSCULAR | Status: AC
  Administered 2016-01-24: 10 mg via INTRAVENOUS
  Filled 2016-01-24: qty 2

## 2016-01-24 NOTE — ED Notes (Signed)
Patient up to restroom.  Gait steady and even.  Still nauseous, but vomiting has ceased at this time

## 2016-01-24 NOTE — ED Notes (Signed)
Pt ambulated to restroom. 

## 2016-01-24 NOTE — ED Notes (Signed)
Pt here for N,V that started 3 hours ago. Sts also abd pain.

## 2016-01-24 NOTE — ED Provider Notes (Signed)
CSN: [id number]     Arrival date & time 01/24/16  1809 History   First MD Initiated Contact with Patient 01/24/16 1850     Chief Complaint  Patient presents with  . Nausea  . Emesis   Taylor Bartlett is a 30 y.o. female who presents to the emergency department complaining of nausea, vomiting and abdominal pain that started approximately 3 hours ago. She reports she's had multiple episodes of vomiting and retching. She also reports one episode of diarrhea today. She went generalized abdominal pain that she rates it an 8 out of 10 currently. No previous abdominal surgeries. She uses marijuana daily. She took Phenergan prior to arrival today for treatment. She has been admitted previously for intractable nausea and vomiting. She reports there is been blood streaked in her vomit today. No hematochezia. She denies fevers, urinary symptoms, chest pain, shortness of breath, fevers, rashes or sick contacts.   Patient is a 31 y.o. female presenting with vomiting. The history is provided by the patient. No language interpreter was used.  Emesis Associated symptoms: abdominal pain and diarrhea   Associated symptoms: no chills, no headaches and no sore throat     Past Medical History  Diagnosis Date  . Seasonal allergies   . Alcohol abuse   . Reflux   . Anxiety   . Hypertension    History reviewed. No pertinent past surgical history. History reviewed. No pertinent family history. Social History  Substance Use Topics  . Smoking status: Current Every Day Smoker -- 0.50 packs/day    Types: Cigarettes  . Smokeless tobacco: None  . Alcohol Use: 1.8 oz/week    3 Cans of beer per week   OB History    No data available     Review of Systems  Constitutional: Negative for fever and chills.  HENT: Negative for congestion and sore throat.   Eyes: Negative for visual disturbance.  Respiratory: Negative for cough and shortness of breath.   Cardiovascular: Negative for chest pain.   Gastrointestinal: Positive for nausea, vomiting, abdominal pain and diarrhea.  Genitourinary: Negative for dysuria, urgency, frequency, hematuria and difficulty urinating.  Musculoskeletal: Negative for back pain and neck pain.  Skin: Negative for rash.  Neurological: Negative for syncope and headaches.      Allergies  Tomato  Home Medications   Prior to Admission medications   Medication Sig Start Date End Date Taking? Authorizing Provider  hydrOXYzine (ATARAX/VISTARIL) 25 MG tablet Take 25 mg by mouth 2 (two) times daily.   Yes Historical Provider, MD  OLANZapine (ZYPREXA) 15 MG tablet Take 15 mg by mouth at bedtime.   Yes Historical Provider, MD  ibuprofen (ADVIL,MOTRIN) 800 MG tablet Take 1 tablet (800 mg total) by mouth 3 (three) times daily. 11/02/15   Shawn C Joy, PA-C  metoCLOPramide (REGLAN) 10 MG tablet Take 1 tablet (10 mg total) by mouth every 6 (six) hours as needed for nausea (nausea/headache). Patient not taking: Reported on 10/26/2015 09/24/15   Fuller Plan, MD  nicotine (NICODERM CQ - DOSED IN MG/24 HOURS) 21 mg/24hr patch Place 1 patch (21 mg total) onto the skin daily. Patient not taking: Reported on 10/26/2015 09/24/15   Fuller Plan, MD  omeprazole (PRILOSEC) 20 MG capsule Take 1 capsule (20 mg total) by mouth daily. 09/24/15   Fuller Plan, MD  ondansetron (ZOFRAN ODT) 4 MG disintegrating tablet Take 1 tablet (4 mg total) by mouth every 8 (eight) hours as needed for nausea or vomiting. 01/25/16  Everlene FarrierWilliam Thaddaeus Granja, PA-C   BP 154/91 mmHg  Pulse 80  Temp(Src) 98.1 F (36.7 C) (Oral)  Resp 16  SpO2 100%  LMP 01/17/2016 Physical Exam  Constitutional: She appears well-developed and well-nourished. No distress.  Nontoxic appearing. Patient retching in room and spitting up spit in emesis basin. No bile noted. No blood in emesis noted.   HENT:  Head: Normocephalic and atraumatic.  Mouth/Throat: Oropharynx is clear and moist.  Eyes: Conjunctivae are  normal. Pupils are equal, round, and reactive to light. Right eye exhibits no discharge. Left eye exhibits no discharge.  Neck: Neck supple.  Cardiovascular: Normal rate, regular rhythm, normal heart sounds and intact distal pulses.  Exam reveals no gallop and no friction rub.   No murmur heard. Pulmonary/Chest: Effort normal and breath sounds normal. No respiratory distress. She has no wheezes. She has no rales.  Abdominal: Soft. Bowel sounds are normal. She exhibits no distension and no mass. There is tenderness. There is no rebound and no guarding.  Abdomen is soft. Bowel sounds are present. Patient has mild generalized abdominal tenderness to palpation without focal tenderness. No peritoneal signs. No CVA or flank tenderness.  Musculoskeletal: She exhibits no edema.  Lymphadenopathy:    She has no cervical adenopathy.  Neurological: She is alert. Coordination normal.  Skin: Skin is warm and dry. No rash noted. She is not diaphoretic. No erythema. No pallor.  Psychiatric: She has a normal mood and affect. Her behavior is normal.  Nursing note and vitals reviewed.   ED Course  Procedures (including critical care time) Labs Review Labs Reviewed  COMPREHENSIVE METABOLIC PANEL - Abnormal; Notable for the following:    Glucose, Bld 113 (*)    All other components within normal limits  URINALYSIS, ROUTINE W REFLEX MICROSCOPIC (NOT AT [facility]) - Abnormal; Notable for the following:    Hgb urine dipstick TRACE (*)    All other components within normal limits  URINE MICROSCOPIC-ADD ON - Abnormal; Notable for the following:    Squamous Epithelial / LPF 6-30 (*)    Bacteria, UA FEW (*)    All other components within normal limits  LIPASE, BLOOD  CBC  PREGNANCY, URINE  I-STAT BETA HCG BLOOD, ED ([facility], [facility], [facility] ONLY)    Imaging Review No results found. I have personally reviewed and evaluated these lab results as part of my medical decision-making.   EKG Interpretation None      Filed  Vitals:   01/24/16 1841 01/24/16 2030 01/24/16 2100 01/24/16 2130  BP: 161/108 176/101 142/63 154/91  Pulse: 83 77 59 80  Temp: 98.1 F (36.7 C)     TempSrc: Oral     Resp:  16    SpO2: 100% 100% 100% 100%     MDM   Meds given in ED:  Medications  ondansetron (ZOFRAN) injection 4 mg (4 mg Intravenous Given 01/24/16 1852)  sodium chloride 0.9 % bolus 1,000 mL (0 mLs Intravenous Stopped 01/24/16 2149)  metoCLOPramide (REGLAN) injection 10 mg (10 mg Intravenous Given 01/24/16 1930)  diphenhydrAMINE (BENADRYL) injection 25 mg (25 mg Intravenous Given 01/24/16 1931)  haloperidol lactate (HALDOL) injection 2 mg (2 mg Intramuscular Given 01/24/16 2147)  LORazepam (ATIVAN) injection 1 mg (1 mg Intravenous Given 01/24/16 2334)    New Prescriptions   ONDANSETRON (ZOFRAN ODT) 4 MG DISINTEGRATING TABLET    Take 1 tablet (4 mg total) by mouth every 8 (eight) hours as needed for nausea or vomiting.    Final diagnoses:  Non-intractable vomiting  with nausea, vomiting of unspecified type  Generalized abdominal pain   This  is a 30 y.o. female who presents to the emergency department complaining of nausea, vomiting and abdominal pain that started approximately 3 hours ago. She reports she's had multiple episodes of vomiting and retching. She also reports one episode of diarrhea today. She went generalized abdominal pain that she rates it an 8 out of 10 currently. No previous abdominal surgeries. She uses marijuana daily. She took Phenergan prior to arrival today for treatment. She has been admitted previously for intractable nausea and vomiting. On exam the patient is afebrile nontoxic appearing. Her abdomen is soft and she has mild generalized abdominal tenderness to palpation. No focal tenderness. Urinalysis is nitrite and leukocyte negative. She is a negative urine pregnancy test. Lipase is within normal limits. CMP is unremarkable. CBC is within normal limits. 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 She's had similar symptoms previously. After Zofran patient still feeling nauseated and still vomiting. Will try Reglan and Benadryl. On reevaluation after Reglan and Benadryl patient still having nausea and vomiting. Patient provided 2 mg of Haldol. Ad reevaluation after Haldol, the patient reports she is feeling thirsty like to try some ginger ale. Patient vomited again after ginger ale. Will try 1 mg Ativan and reevaluate. After 1 mg Ativan patient is willing to try it some water. Patient tolerated water well without vomiting. Will reevaluate to ensure she still keeps down the water. Later, the patient the patient reports no vomiting after drinking water. She is tolerating PO. We'll discharge with prescription for Zofran and encouraged follow-up by primary care. I discussed BRAT diet. I encouraged her to stop smoking marijuana. I discussed return precautions. I advised to return to the emergency department with new or worsening symptoms or new concerns. The patient verbalized understanding and agreement with plan.   Everlene Farrier, PA-C 01/25/16 1610  Pricilla Loveless, MD 01/25/16 Windy Fast

## 2016-01-24 NOTE — ED Notes (Signed)
PA at bedside.

## 2016-01-24 NOTE — ED Notes (Signed)
Pt states she is still nauseous

## 2016-01-24 NOTE — ED Notes (Signed)
Verified with PA, pt provided with ginger ale and ice chips

## 2016-01-25 MED ORDER — ONDANSETRON 4 MG PO TBDP: 4.0000 mg | ORAL_TABLET | Freq: Three times a day (TID) | ORAL | Status: DC | PRN

## 2016-01-25 NOTE — Discharge Instructions (Signed)
Nausea and Vomiting °Nausea is a sick feeling that often comes before throwing up (vomiting). Vomiting is a reflex where stomach contents come out of your mouth. Vomiting can cause severe loss of body fluids (dehydration). Children and elderly adults can become dehydrated quickly, especially if they also have diarrhea. Nausea and vomiting are symptoms of a condition or disease. It is important to find the cause of your symptoms. °CAUSES  °· Direct irritation of the stomach lining. This irritation can result from increased acid production (gastroesophageal reflux disease), infection, food poisoning, taking certain medicines (such as nonsteroidal anti-inflammatory drugs), alcohol use, or tobacco use. °· Signals from the brain. These signals could be caused by a headache, heat exposure, an inner ear disturbance, increased pressure in the brain from injury, infection, a tumor, or a concussion, pain, emotional stimulus, or metabolic problems. °· An obstruction in the gastrointestinal tract (bowel obstruction). °· Illnesses such as diabetes, hepatitis, gallbladder problems, appendicitis, kidney problems, cancer, sepsis, atypical symptoms of a heart attack, or eating disorders. °· Medical treatments such as chemotherapy and radiation. °· Receiving medicine that makes you sleep (general anesthetic) during surgery. °DIAGNOSIS °Your caregiver may ask for tests to be done if the problems do not improve after a few days. Tests may also be done if symptoms are severe or if the reason for the nausea and vomiting is not clear. Tests may include: °· Urine tests. °· Blood tests. °· Stool tests. °· Cultures (to look for evidence of infection). °· X-rays or other imaging studies. °Test results can help your caregiver make decisions about treatment or the need for additional tests. °TREATMENT °You need to stay well hydrated. Drink frequently but in small amounts. You may wish to drink water, sports drinks, clear broth, or eat frozen  ice pops or gelatin dessert to help stay hydrated. When you eat, eating slowly may help prevent nausea. There are also some antinausea medicines that may help prevent nausea. °HOME CARE INSTRUCTIONS  °· Take all medicine as directed by your caregiver. °· If you do not have an appetite, do not force yourself to eat. However, you must continue to drink fluids. °· If you have an appetite, eat a normal diet unless your caregiver tells you differently. °¨ Eat a variety of complex carbohydrates (rice, wheat, potatoes, bread), lean meats, yogurt, fruits, and vegetables. °¨ Avoid high-fat foods because they are more difficult to digest. °· Drink enough water and fluids to keep your urine clear or pale yellow. °· If you are dehydrated, ask your caregiver for specific rehydration instructions. Signs of dehydration may include: °¨ Severe thirst. °¨ Dry lips and mouth. °¨ Dizziness. °¨ Dark urine. °¨ Decreasing urine frequency and amount. °¨ Confusion. °¨ Rapid breathing or pulse. °SEEK IMMEDIATE MEDICAL CARE IF:  °· You have blood or brown flecks (like coffee grounds) in your vomit. °· You have black or bloody stools. °· You have a severe headache or stiff neck. °· You are confused. °· You have severe abdominal pain. °· You have chest pain or trouble breathing. °· You do not urinate at least once every 8 hours. °· You develop cold or clammy skin. °· You continue to vomit for longer than 24 to 48 hours. °· You have a fever. °MAKE SURE YOU:  °· Understand these instructions. °· Will watch your condition. °· Will get help right away if you are not doing well or get worse. °  °This information is not intended to replace advice given to you by your health care provider. Make sure   you discuss any questions you have with your health care provider.   Document Released: 11/08/2005 Document Revised: 01/31/2012 Document Reviewed: 04/07/2011 Elsevier Interactive Patient Education 2016 [employer]. Cannabis Use Disorder Cannabis use  disorder is a mental disorder. It is not one-time or occasional use of cannabis, more commonly known as marijuana. Cannabis use disorder is the continued, nonmedical use of cannabis that interferes with normal life activities or causes health problems. People with cannabis use disorder get a feeling of extreme pleasure and relaxation from cannabis use. This "high" is very rewarding and causes people to use over and over.  The mind-altering ingredient in cannabis is know as THC. THC can also interfere with motor coordination, memory, judgment, and accurate sense of space and time. These effects can last for a few days after using cannabis. Regular heavy cannabis use can cause long-lasting problems with thinking and learning. In young people, these problems may be permanent. Cannabis sometimes causes severe anxiety, paranoia, or visual hallucinations. Man-made (synthetic) cannabis-like drugs, such as "spice" and "K2," cause the same effects as THC but are much stronger. Cannabis-like drugs can cause dangerously high blood pressure and heart rate.  Cannabis use disorder usually starts in the teenage years. It can trigger the development of schizophrenia. It is somewhat more common in men than women. People who have family members with the disorder or existing mental health issues such as depression and posttraumatic stress disorderare more likely to develop cannabis use disorder. People with cannabis use disorder are at higher risk for use of other drugs of abuse.  SIGNS AND SYMPTOMS Signs and symptoms of cannabis use disorder include:   Use of cannabis in larger amounts or over a longer period than intended.   Unsuccessful attempts to cut down or control cannabis use.   A lot of time spent obtaining, using, or recovering from the effects of cannabis.   A strong desire or urge to use cannabis (cravings).   Continued use of cannabis in spite of problems at work, school, or home because of use.    Continued use of cannabis in spite of relationship problems because of use.  Giving up or cutting down on important life activities because of cannabis use.  Use of cannabis over and over even in situations when it is physically hazardous, such as when driving a car.   Continued use of cannabis in spite of a physical problem that is likely related to use. Physical problems can include:  Chronic cough.  Bronchitis.  Emphysema.  Throat and lung cancer.  Continued use of cannabis in spite of a mental problem that is likely related to use. Mental problems can include:  Psychosis.  Anxiety.  Difficulty sleeping.  Need to use more and more cannabis to get the same effect, or lessened effect over time with use of the same amount (tolerance).  Having withdrawal symptoms when cannabis use is stopped, or using cannabis to reduce or avoid withdrawal symptoms. Withdrawal symptoms include:  Irritability or anger.  Anxiety or restlessness.  Difficulty sleeping.  Loss of appetite or weight.  Aches and pains.  Shakiness.  Sweating.  Chills. DIAGNOSIS Cannabis use disorder is diagnosed by your health care provider. You may be asked questions about your cannabis use and how it affects your life. A physical exam may be done. A drug screen may be done. You may be referred to a mental health professional. The diagnosis of cannabis use disorder requires at least two symptoms within 12 months. The type of  cannabis use disorder you have depends on the number of symptoms you have. The type may be:  Mild. Two or three signs and symptoms.   Moderate. Four or five signs and symptoms.   Severe. Six or more signs and symptoms.  TREATMENT Treatment is usually provided by mental health professionals with training in substance use disorders. The following options are available:  Counseling or talk therapy. Talk therapy addresses the reasons you use cannabis. It also addresses ways to  keep you from using again. The goals of talk therapy include:  Identifying and avoiding triggers for use.  Learning how to handle cravings.  Replacing use with healthy activities.  Support groups. Support groups provide emotional support, advice, and guidance.  Medicine. Medicine is used to treat mental health issues that trigger cannabis use or that result from it. HOME CARE INSTRUCTIONS  Take medicines only as directed by your health care provider.  Check with your health care provider before starting any new medicines.  Keep all follow-up visits as directed by your health care provider. SEEK MEDICAL CARE IF:  You are not able to take your medicines as directed.  Your symptoms get worse. SEEK IMMEDIATE MEDICAL CARE IF: You have serious thoughts about hurting yourself or others. FOR MORE INFORMATION  National Institute on Drug Abuse: http://www.price-smith.com/www.drugabuse.gov  Substance Abuse and Mental Health Services Administration: SkateOasis.com.ptwww.samhsa.gov   This information is not intended to replace advice given to you by your health care provider. Make sure you discuss any questions you have with your health care provider.   Document Released: 11/05/2000 Document Revised: 11/29/2014 Document Reviewed: 11/21/2013 Elsevier Interactive Patient Education 2016 [employer] Food Choices to Help Relieve Vomiting and Diarrhea, Adult When you have diarrhea, the foods you eat and your eating habits are very important. Choosing the right foods and drinks can help relieve diarrhea. Also, because diarrhea can last up to 7 days, you need to replace lost fluids and electrolytes (such as sodium, potassium, and chloride) in order to help prevent dehydration.  WHAT GENERAL GUIDELINES DO I NEED TO FOLLOW?  Slowly drink 1 cup (8 oz) of fluid for each episode of diarrhea. If you are getting enough fluid, your urine will be clear or pale yellow.  Eat starchy foods. Some good choices include white rice, white toast, pasta,  low-fiber cereal, baked potatoes (without the skin), saltine crackers, and bagels.  Avoid large servings of any cooked vegetables.  Limit fruit to two servings per day. A serving is  cup or 1 small piece.  Choose foods with less than 2 g of fiber per serving.  Limit fats to less than 8 tsp (38 g) per day.  Avoid fried foods.  Eat foods that have probiotics in them. Probiotics can be found in certain dairy products.  Avoid foods and beverages that may increase the speed at which food moves through the stomach and intestines (gastrointestinal tract). Things to avoid include:  High-fiber foods, such as dried fruit, raw fruits and vegetables, nuts, seeds, and whole grain foods.  Spicy foods and high-fat foods.  Foods and beverages sweetened with high-fructose corn syrup, honey, or sugar alcohols such as xylitol, sorbitol, and mannitol. WHAT FOODS ARE RECOMMENDED? Grains White rice. White, [place], or pita breads (fresh or toasted), including plain rolls, buns, or bagels. White pasta. Saltine, soda, or graham crackers. Pretzels. Low-fiber cereal. Cooked cereals made with water (such as cornmeal, farina, or cream cereals). Plain muffins. Matzo. Melba toast. Zwieback.  Vegetables Potatoes (without the skin). Strained  tomato and vegetable juices. Most well-cooked and canned vegetables without seeds. Tender lettuce. Fruits Cooked or canned applesauce, apricots, cherries, fruit cocktail, grapefruit, peaches, pears, or plums. Fresh bananas, apples without skin, cherries, grapes, cantaloupe, grapefruit, peaches, oranges, or plums.  Meat and Other Protein Products Baked or boiled chicken. Eggs. Tofu. Fish. Seafood. Smooth peanut butter. Ground or well-cooked tender beef, ham, veal, lamb, pork, or poultry.  Dairy Plain yogurt, kefir, and unsweetened liquid yogurt. Lactose-free milk, buttermilk, or soy milk. Plain hard cheese. Beverages Sport drinks. Clear broths. Diluted fruit juices (except  prune). Regular, caffeine-free sodas such as ginger ale. Water. Decaffeinated teas. Oral rehydration solutions. Sugar-free beverages not sweetened with sugar alcohols. Other Bouillon, broth, or soups made from recommended foods.  The items listed above may not be a complete list of recommended foods or beverages. Contact your dietitian for more options. WHAT FOODS ARE NOT RECOMMENDED? Grains Whole grain, whole wheat, bran, or rye breads, rolls, pastas, crackers, and cereals. Wild or brown rice. Cereals that contain more than 2 g of fiber per serving. Corn tortillas or taco shells. Cooked or dry oatmeal. Granola. Popcorn. Vegetables Raw vegetables. Cabbage, broccoli, Brussels sprouts, artichokes, baked beans, beet greens, corn, kale, legumes, peas, sweet potatoes, and yams. Potato skins. Cooked spinach and cabbage. Fruits Dried fruit, including raisins and dates. Raw fruits. Stewed or dried prunes. Fresh apples with skin, apricots, mangoes, pears, raspberries, and strawberries.  Meat and Other Protein Products Chunky peanut butter. Nuts and seeds. Beans and lentils. Tomasa Blase.  Dairy High-fat cheeses. Milk, chocolate milk, and beverages made with milk, such as milk shakes. Cream. Ice cream. Sweets and Desserts Sweet rolls, doughnuts, and sweet breads. Pancakes and waffles. Fats and Oils Butter. Cream sauces. Margarine. Salad oils. Plain salad dressings. Olives. Avocados.  Beverages Caffeinated beverages (such as coffee, tea, soda, or energy drinks). Alcoholic beverages. Fruit juices with pulp. Prune juice. Soft drinks sweetened with high-fructose corn syrup or sugar alcohols. Other Coconut. Hot sauce. Chili powder. Mayonnaise. Gravy. Cream-based or milk-based soups.  The items listed above may not be a complete list of foods and beverages to avoid. Contact your dietitian for more information. WHAT SHOULD I DO IF I BECOME DEHYDRATED? Diarrhea can sometimes lead to dehydration. Signs of dehydration  include dark urine and dry mouth and skin. If you think you are dehydrated, you should rehydrate with an oral rehydration solution. These solutions can be purchased at pharmacies, retail stores, or online.  Drink -1 cup (120-240 mL) of oral rehydration solution each time you have an episode of diarrhea. If drinking this amount makes your diarrhea worse, try drinking smaller amounts more often. For example, drink 1-3 tsp (5-15 mL) every 5-10 minutes.  A general rule for staying hydrated is to drink 1-2 L of fluid per day. Talk to your health care provider about the specific amount you should be drinking each day. Drink enough fluids to keep your urine clear or pale yellow.   This information is not intended to replace advice given to you by your health care provider. Make sure you discuss any questions you have with your health care provider.   Document Released: 01/29/2004 Document Revised: 11/29/2014 Document Reviewed: 10/01/2013 Elsevier Interactive Patient Education [employer]

## 2016-01-25 NOTE — ED Notes (Signed)
PA-C at bedside 

## 2016-01-27 ENCOUNTER — Encounter (HOSPITAL_COMMUNITY): Payer: Self-pay

## 2016-01-27 ENCOUNTER — Emergency Department (HOSPITAL_COMMUNITY)
Admission: EM | Admit: 2016-01-27 | Discharge: 2016-01-27 | Disposition: A | Discharge status: Home / Self Care | Payer: No Typology Code available for payment source | Attending: Emergency Medicine | Admitting: Emergency Medicine

## 2016-01-27 DIAGNOSIS — K30 Functional dyspepsia: Secondary | ICD-10-CM | POA: Insufficient documentation

## 2016-01-27 DIAGNOSIS — I10 Essential (primary) hypertension: Secondary | ICD-10-CM | POA: Insufficient documentation

## 2016-01-27 DIAGNOSIS — F17210 Nicotine dependence, cigarettes, uncomplicated: Secondary | ICD-10-CM | POA: Insufficient documentation

## 2016-01-27 DIAGNOSIS — Z3202 Encounter for pregnancy test, result negative: Secondary | ICD-10-CM | POA: Insufficient documentation

## 2016-01-27 DIAGNOSIS — Z8659 Personal history of other mental and behavioral disorders: Secondary | ICD-10-CM | POA: Insufficient documentation

## 2016-01-27 DIAGNOSIS — R1084 Generalized abdominal pain: Secondary | ICD-10-CM | POA: Insufficient documentation

## 2016-01-27 DIAGNOSIS — K219 Gastro-esophageal reflux disease without esophagitis: Secondary | ICD-10-CM | POA: Insufficient documentation

## 2016-01-27 DIAGNOSIS — R112 Nausea with vomiting, unspecified: Secondary | ICD-10-CM | POA: Insufficient documentation

## 2016-01-27 DIAGNOSIS — K921 Melena: Secondary | ICD-10-CM | POA: Insufficient documentation

## 2016-01-27 DIAGNOSIS — F1290 Cannabis use, unspecified, uncomplicated: Secondary | ICD-10-CM | POA: Insufficient documentation

## 2016-01-27 LAB — I-STAT BETA HCG BLOOD, ED (MC, WL, AP ONLY)

## 2016-01-27 LAB — CBC WITH DIFFERENTIAL/PLATELET
BASOS ABS: 0 10*3/uL (ref 0.0–0.1)
BASOS PCT: 0 %
EOS ABS: 0 10*3/uL (ref 0.0–0.7)
EOS PCT: 0 %
HCT: 45.8 % (ref 36.0–46.0)
Hemoglobin: 15.1 g/dL — ABNORMAL HIGH (ref 12.0–15.0)
LYMPHS PCT: 16 %
Lymphs Abs: 2.5 10*3/uL (ref 0.7–4.0)
MCH: 32.1 pg (ref 26.0–34.0)
MCHC: 33 g/dL (ref 30.0–36.0)
MCV: 97.4 fL (ref 78.0–100.0)
MONO ABS: 1 10*3/uL (ref 0.1–1.0)
Monocytes Relative: 6 %
Neutro Abs: 12.1 10*3/uL — ABNORMAL HIGH (ref 1.7–7.7)
Neutrophils Relative %: 78 %
PLATELETS: 273 10*3/uL (ref 150–400)
RBC: 4.7 MIL/uL (ref 3.87–5.11)
RDW: 13.1 % (ref 11.5–15.5)
WBC: 15.6 10*3/uL — AB (ref 4.0–10.5)

## 2016-01-27 LAB — COMPREHENSIVE METABOLIC PANEL
ALK PHOS: 72 U/L (ref 38–126)
ALT: 17 U/L (ref 14–54)
ANION GAP: 13 (ref 5–15)
AST: 23 U/L (ref 15–41)
Albumin: 5.3 g/dL — ABNORMAL HIGH (ref 3.5–5.0)
BUN: 11 mg/dL (ref 6–20)
CALCIUM: 9.7 mg/dL (ref 8.9–10.3)
CO2: 30 mmol/L (ref 22–32)
Chloride: 93 mmol/L — ABNORMAL LOW (ref 101–111)
Creatinine, Ser: 0.95 mg/dL (ref 0.44–1.00)
Glucose, Bld: 124 mg/dL — ABNORMAL HIGH (ref 65–99)
Potassium: 2.8 mmol/L — ABNORMAL LOW (ref 3.5–5.1)
SODIUM: 136 mmol/L (ref 135–145)
Total Bilirubin: 0.4 mg/dL (ref 0.3–1.2)
Total Protein: 9.6 g/dL — ABNORMAL HIGH (ref 6.5–8.1)

## 2016-01-27 LAB — ETHANOL

## 2016-01-27 LAB — LIPASE, BLOOD: LIPASE: 28 U/L (ref 11–51)

## 2016-01-27 MED ORDER — ONDANSETRON HCL 4 MG/2ML IJ SOLN
4.0000 mg | Freq: Once | INTRAMUSCULAR | Status: AC
  Administered 2016-01-27: 4 mg via INTRAVENOUS
  Filled 2016-01-27: qty 2

## 2016-01-27 MED ORDER — SODIUM CHLORIDE 0.9 % IV BOLUS (SEPSIS)
1000.0000 mL | Freq: Once | INTRAVENOUS | Status: AC
  Administered 2016-01-27: 1000 mL via INTRAVENOUS

## 2016-01-27 MED ORDER — ONDANSETRON 4 MG PO TBDP: 4.0000 mg | ORAL_TABLET | Freq: Three times a day (TID) | ORAL | Status: DC | PRN

## 2016-01-27 MED ORDER — PROCHLORPERAZINE EDISYLATE 5 MG/ML IJ SOLN
10.0000 mg | Freq: Once | INTRAMUSCULAR | Status: AC
  Administered 2016-01-27: 10 mg via INTRAVENOUS
  Filled 2016-01-27: qty 2

## 2016-01-27 MED ORDER — DICYCLOMINE HCL 10 MG/ML IM SOLN
20.0000 mg | Freq: Once | INTRAMUSCULAR | Status: AC
  Administered 2016-01-27: 20 mg via INTRAMUSCULAR
  Filled 2016-01-27: qty 2

## 2016-01-27 NOTE — ED Notes (Signed)
MD at bedside. 

## 2016-01-27 NOTE — ED Notes (Signed)
Bed: WA17 Expected date:  Expected time:  Means of arrival:  Comments: N.,V

## 2016-01-27 NOTE — ED Notes (Signed)
BIB EMS c/o abdominal pain x 3 day. Endorses N/V, denies diarrhea. States that this happens every 3 months and the doctor says that weed hurts her stomach. A&Ox4. Ambulatory in triage. Hx of HTN.

## 2016-01-27 NOTE — Discharge Instructions (Signed)
As discussed, it is important that you follow up as soon as possible with your physician and our gastroenterologist for continued management of your condition.  If you develop any new, or concerning changes in your condition, please return to the emergency department immediately.

## 2016-01-27 NOTE — ED Provider Notes (Signed)
CSN: [id number]     Arrival date & time 01/27/16  0304 History  By signing my name below, I, Taylor Bartlett, attest that this documentation has been prepared under the direction and in the presence of Taylor Munchobert Carollee Nussbaumer, MD. Electronically Signed: Tanda RockersMargaux Bartlett, ED Scribe. 01/27/2016. 3:20 AM.   Chief Complaint  Patient presents with  . Abdominal Pain  . Nausea   The history is provided by the patient. No language interpreter was used.     HPI Comments: Taylor Bartlett is a 30 y.o. female brought in by ambulance, who presents to the Emergency Department complaining of gradual onset, constant, diffuse abdominal pain x 3 days. Pt also complains of nausea, vomiting, indigestion, and melena. Pt reports similar symptoms every 3-4 months. Pt states she has followed up with a doctor at [facility] for these symptoms but cannot recall the name of the provider. She mentions being told that her symptoms could be caused by smoking marijuana. The last time pt used marijuana was 3 days ago. Denies fever, syncope, or any other associated symptoms. LNMP: 2 weeks ago.   Past Medical History  Diagnosis Date  . Seasonal allergies   . Alcohol abuse   . Reflux   . Anxiety   . Hypertension    History reviewed. No pertinent past surgical history. History reviewed. No pertinent family history. Social History  Substance Use Topics  . Smoking status: Current Every Day Smoker -- 0.50 packs/day    Types: Cigarettes  . Smokeless tobacco: None  . Alcohol Use: 1.8 oz/week    3 Cans of beer per week   OB History    No data available     Review of Systems  Constitutional: Negative for fever.       Per HPI, otherwise negative  HENT:       Per HPI, otherwise negative  Respiratory:       Per HPI, otherwise negative  Cardiovascular:       Per HPI, otherwise negative  Gastrointestinal: Positive for nausea, vomiting and abdominal pain.  Endocrine:       Negative aside from HPI  Genitourinary:       Neg  aside from HPI   Musculoskeletal:       Per HPI, otherwise negative  Skin: Negative.   Neurological: Negative for syncope.  Psychiatric/Behavioral:       Marijuana use      Allergies  Tomato  Home Medications   Prior to Admission medications   Medication Sig Start Date End Date Taking? Authorizing Provider  hydrOXYzine (ATARAX/VISTARIL) 25 MG tablet Take 25 mg by mouth 2 (two) times daily.   Yes Historical Provider, MD  OLANZapine (ZYPREXA) 15 MG tablet Take 15 mg by mouth at bedtime.   Yes Historical Provider, MD  omeprazole (PRILOSEC) 20 MG capsule Take 1 capsule (20 mg total) by mouth daily. Patient taking differently: Take 20 mg by mouth daily as needed (heartburn).  09/24/15  Yes Fuller Planhristopher W Rice, MD  ibuprofen (ADVIL,MOTRIN) 800 MG tablet Take 1 tablet (800 mg total) by mouth 3 (three) times daily. Patient not taking: Reported on 01/27/2016 11/02/15   Hillard DankerShawn C Joy, PA-C  metoCLOPramide (REGLAN) 10 MG tablet Take 1 tablet (10 mg total) by mouth every 6 (six) hours as needed for nausea (nausea/headache). Patient not taking: Reported on 10/26/2015 09/24/15   Fuller Planhristopher W Rice, MD  nicotine (NICODERM CQ - DOSED IN MG/24 HOURS) 21 mg/24hr patch Place 1 patch (21 mg total) onto the skin  daily. Patient not taking: Reported on 10/26/2015 09/24/15   Fuller Plan, MD  ondansetron (ZOFRAN ODT) 4 MG disintegrating tablet Take 1 tablet (4 mg total) by mouth every 8 (eight) hours as needed for nausea or vomiting. Patient not taking: Reported on 01/27/2016 01/25/16   Everlene Farrier, PA-C   BP 142/94 mmHg  Pulse 79  Temp(Src) 98.1 F (36.7 C) (Oral)  Resp 16  SpO2 95%  LMP 01/17/2016   Physical Exam  Constitutional: She is oriented to person, place, and time. She appears well-developed and well-nourished. No distress.  HENT:  Head: Normocephalic and atraumatic.  Eyes: Conjunctivae and EOM are normal.  Cardiovascular: Normal rate and regular rhythm.   Pulmonary/Chest: Effort normal and  breath sounds normal. No stridor. No respiratory distress. She has no wheezes. She has no rales.  Abdominal: She exhibits no distension.  Minimal diffuse tenderness, no guarding, no rebound, no peritoneal findings  Musculoskeletal: She exhibits no edema.  Neurological: She is alert and oriented to person, place, and time. No cranial nerve deficit.  Skin: Skin is warm and dry.  Psychiatric: She has a normal mood and affect.  Nursing note and vitals reviewed.   ED Course  Procedures (including critical care time)  DIAGNOSTIC STUDIES: Oxygen Saturation is 99% on RA, normal by my interpretation.    COORDINATION OF CARE: 3:19 AM-Discussed treatment plan with pt at bedside and pt agreed to plan.   Labs Review Labs Reviewed  COMPREHENSIVE METABOLIC PANEL - Abnormal; Notable for the following:    Potassium 2.8 (*)    Chloride 93 (*)    Glucose, Bld 124 (*)    Total Protein 9.6 (*)    Albumin 5.3 (*)    All other components within normal limits  CBC WITH DIFFERENTIAL/PLATELET - Abnormal; Notable for the following:    WBC 15.6 (*)    Hemoglobin 15.1 (*)    Neutro Abs 12.1 (*)    All other components within normal limits  ETHANOL  LIPASE, BLOOD  I-STAT BETA HCG BLOOD, ED ([facility], [facility], [facility] ONLY)   Update: Patient substantially more comfortable appearing, sleeping. She awakens easily. She has received 2 different antiemetics, 2 L of fluid, has no ongoing complaints. MDM  Young female presents with episodic nausea, vomiting, abdominal pain. Patient is awake, alert, afebrile. She does have mild leukocytosis, but no evidence for peritonitis. Patient's acknowledgment of ongoing marijuana use, raises some suspicion for marijuana use disorder, with nausea, vomiting. Patient's symptoms resolve with fluids, antiemetics. Patient discharged in stable condition to follow-up with GI, primary care.   I personally performed the services described in this documentation, which was scribed in my  presence. The recorded information has been reviewed and is accurate.      Taylor Munch, MD 01/27/16 [phone]

## 2016-02-14 ENCOUNTER — Emergency Department (HOSPITAL_COMMUNITY)
Admission: EM | Admit: 2016-02-14 | Discharge: 2016-02-14 | Disposition: A | Discharge status: Home / Self Care | Payer: No Typology Code available for payment source | Attending: Emergency Medicine | Admitting: Emergency Medicine

## 2016-02-14 ENCOUNTER — Encounter (HOSPITAL_COMMUNITY): Payer: Self-pay | Admitting: Nurse Practitioner

## 2016-02-14 DIAGNOSIS — F419 Anxiety disorder, unspecified: Secondary | ICD-10-CM | POA: Insufficient documentation

## 2016-02-14 DIAGNOSIS — I10 Essential (primary) hypertension: Secondary | ICD-10-CM | POA: Insufficient documentation

## 2016-02-14 DIAGNOSIS — R1084 Generalized abdominal pain: Secondary | ICD-10-CM | POA: Insufficient documentation

## 2016-02-14 DIAGNOSIS — R112 Nausea with vomiting, unspecified: Secondary | ICD-10-CM | POA: Insufficient documentation

## 2016-02-14 DIAGNOSIS — F1110 Opioid abuse, uncomplicated: Secondary | ICD-10-CM | POA: Insufficient documentation

## 2016-02-14 DIAGNOSIS — Z79899 Other long term (current) drug therapy: Secondary | ICD-10-CM | POA: Insufficient documentation

## 2016-02-14 DIAGNOSIS — F1210 Cannabis abuse, uncomplicated: Secondary | ICD-10-CM | POA: Insufficient documentation

## 2016-02-14 DIAGNOSIS — F17210 Nicotine dependence, cigarettes, uncomplicated: Secondary | ICD-10-CM | POA: Insufficient documentation

## 2016-02-14 DIAGNOSIS — F1410 Cocaine abuse, uncomplicated: Secondary | ICD-10-CM | POA: Insufficient documentation

## 2016-02-14 DIAGNOSIS — K219 Gastro-esophageal reflux disease without esophagitis: Secondary | ICD-10-CM | POA: Insufficient documentation

## 2016-02-14 LAB — COMPREHENSIVE METABOLIC PANEL
ALBUMIN: 4.5 g/dL (ref 3.5–5.0)
ALK PHOS: 65 U/L (ref 38–126)
ALT: 16 U/L (ref 14–54)
AST: 50 U/L — ABNORMAL HIGH (ref 15–41)
Anion gap: 15 (ref 5–15)
BUN: 12 mg/dL (ref 6–20)
CALCIUM: 10.3 mg/dL (ref 8.9–10.3)
CO2: 22 mmol/L (ref 22–32)
CREATININE: 0.84 mg/dL (ref 0.44–1.00)
Chloride: 101 mmol/L (ref 101–111)
GFR calc Af Amer: >60 mL/min (ref >60)
GFR calc non Af Amer: >60 mL/min (ref >60)
GLUCOSE: 108 mg/dL — AB (ref 65–99)
Potassium: 5.1 mmol/L (ref 3.5–5.1)
SODIUM: 138 mmol/L (ref 135–145)
Total Bilirubin: 1.2 mg/dL (ref 0.3–1.2)
Total Protein: 7.9 g/dL (ref 6.5–8.1)

## 2016-02-14 LAB — LIPASE, BLOOD: Lipase: 23 U/L (ref 11–51)

## 2016-02-14 LAB — URINALYSIS, ROUTINE W REFLEX MICROSCOPIC
Bilirubin Urine: NEGATIVE
GLUCOSE, UA: NEGATIVE mg/dL
KETONES UR: 15 mg/dL — AB
LEUKOCYTES UA: NEGATIVE
Nitrite: NEGATIVE
PH: 7 (ref 5.0–8.0)
Protein, ur: 100 mg/dL — AB
SPECIFIC GRAVITY, URINE: 1.027 (ref 1.005–1.030)

## 2016-02-14 LAB — RAPID URINE DRUG SCREEN, HOSP PERFORMED
AMPHETAMINES: NOT DETECTED
BARBITURATES: NOT DETECTED
Benzodiazepines: NOT DETECTED
COCAINE: POSITIVE — AB
Opiates: POSITIVE — AB
TETRAHYDROCANNABINOL: POSITIVE — AB

## 2016-02-14 LAB — CBC
HEMATOCRIT: 35.7 % — AB (ref 36.0–46.0)
HEMOGLOBIN: 11.6 g/dL — AB (ref 12.0–15.0)
MCH: 31.4 pg (ref 26.0–34.0)
MCHC: 32.5 g/dL (ref 30.0–36.0)
MCV: 96.7 fL (ref 78.0–100.0)
Platelets: 233 10*3/uL (ref 150–400)
RBC: 3.69 MIL/uL — ABNORMAL LOW (ref 3.87–5.11)
RDW: 13.5 % (ref 11.5–15.5)
WBC: 11.5 10*3/uL — ABNORMAL HIGH (ref 4.0–10.5)

## 2016-02-14 LAB — URINE MICROSCOPIC-ADD ON

## 2016-02-14 LAB — ETHANOL: Alcohol, Ethyl (B): <5 mg/dL (ref <5)

## 2016-02-14 MED ORDER — SODIUM CHLORIDE 0.9 % IV BOLUS (SEPSIS)
1000.0000 mL | Freq: Once | INTRAVENOUS | Status: AC
  Administered 2016-02-14: 1000 mL via INTRAVENOUS

## 2016-02-14 MED ORDER — ONDANSETRON HCL 4 MG/2ML IJ SOLN
4.0000 mg | Freq: Once | INTRAMUSCULAR | Status: AC
  Administered 2016-02-14: 4 mg via INTRAVENOUS
  Filled 2016-02-14: qty 2

## 2016-02-14 MED ORDER — ONDANSETRON 4 MG PO TBDP: 4.0000 mg | ORAL_TABLET | Freq: Three times a day (TID) | ORAL | Status: DC | PRN

## 2016-02-14 MED ORDER — SUCRALFATE 1 G PO TABS
1.0000 g | ORAL_TABLET | Freq: Once | ORAL | Status: AC
  Administered 2016-02-14: 1 g via ORAL
  Filled 2016-02-14: qty 1

## 2016-02-14 MED ORDER — KETOROLAC TROMETHAMINE 30 MG/ML IJ SOLN
30.0000 mg | Freq: Once | INTRAMUSCULAR | Status: AC
  Administered 2016-02-14: 30 mg via INTRAVENOUS
  Filled 2016-02-14: qty 1

## 2016-02-14 MED ORDER — LORAZEPAM 2 MG/ML IJ SOLN
1.0000 mg | Freq: Once | INTRAMUSCULAR | Status: AC
  Administered 2016-02-14: 1 mg via INTRAVENOUS
  Filled 2016-02-14: qty 1

## 2016-02-14 MED ORDER — DICYCLOMINE HCL 10 MG/ML IM SOLN
20.0000 mg | Freq: Once | INTRAMUSCULAR | Status: AC
  Administered 2016-02-14: 20 mg via INTRAMUSCULAR
  Filled 2016-02-14: qty 2

## 2016-02-14 NOTE — ED Provider Notes (Signed)
CSN: [id number]     Arrival date & time 02/14/16  1828 History   First MD Initiated Contact with Patient 02/14/16 1852     Chief Complaint  Patient presents with  . Emesis    HPI  Patient presents with concern of ongoing nausea, vomiting. This episode began earlier today. Patient states that the symptoms are similar to those she experienced multiple prior times, including several weeks ago, when I evaluated her. Prior to today she was generally in her usual state of health. She last used marijuana 3 days ago. She states that she frequently has episodes such as today's after using marijuana. She states that she has had no relief with anything, has been intolerant of oral intake. She denies fever, describes chills. She denies ongoing abdominal pain.   Past Medical History  Diagnosis Date  . Seasonal allergies   . Alcohol abuse   . Reflux   . Anxiety   . Hypertension    History reviewed. No pertinent past surgical history. History reviewed. No pertinent family history. Social History  Substance Use Topics  . Smoking status: Current Every Day Smoker -- 0.50 packs/day    Types: Cigarettes  . Smokeless tobacco: None  . Alcohol Use: 1.8 oz/week    3 Cans of beer per week   OB History    No data available     Review of Systems  Constitutional: Negative for fever.       Per HPI, otherwise negative  HENT:       Per HPI, otherwise negative  Respiratory:       Per HPI, otherwise negative  Cardiovascular:       Per HPI, otherwise negative  Gastrointestinal: Positive for nausea, vomiting and abdominal pain.  Endocrine:       Negative aside from HPI  Genitourinary:       Neg aside from HPI   Musculoskeletal:       Per HPI, otherwise negative  Skin: Negative.   Neurological: Negative for syncope.  Psychiatric/Behavioral:       Marijuana use      Allergies  Tomato  Home Medications   Prior to Admission medications   Medication Sig Start Date End Date Taking?  Authorizing Provider  hydrOXYzine (ATARAX/VISTARIL) 25 MG tablet Take 25 mg by mouth 2 (two) times daily.    Historical Provider, MD  ibuprofen (ADVIL,MOTRIN) 800 MG tablet Take 1 tablet (800 mg total) by mouth 3 (three) times daily. Patient not taking: Reported on 01/27/2016 11/02/15   Hillard DankerShawn C Joy, PA-C  metoCLOPramide (REGLAN) 10 MG tablet Take 1 tablet (10 mg total) by mouth every 6 (six) hours as needed for nausea (nausea/headache). Patient not taking: Reported on 10/26/2015 09/24/15   Fuller Planhristopher W Rice, MD  nicotine (NICODERM CQ - DOSED IN MG/24 HOURS) 21 mg/24hr patch Place 1 patch (21 mg total) onto the skin daily. Patient not taking: Reported on 10/26/2015 09/24/15   Fuller Planhristopher W Rice, MD  OLANZapine (ZYPREXA) 15 MG tablet Take 15 mg by mouth at bedtime.    Historical Provider, MD  omeprazole (PRILOSEC) 20 MG capsule Take 1 capsule (20 mg total) by mouth daily. Patient taking differently: Take 20 mg by mouth daily as needed (heartburn).  09/24/15   Fuller Planhristopher W Rice, MD  ondansetron (ZOFRAN ODT) 4 MG disintegrating tablet Take 1 tablet (4 mg total) by mouth every 8 (eight) hours as needed for nausea or vomiting. 01/27/16   Gerhard Munchobert Dakoda Bassette, MD   BP 170/89 mmHg  Pulse  56  Temp(Src) 97.3 F (36.3 C)  Resp 18  SpO2 99%  LMP 01/10/2016 Physical Exam  Constitutional: She is oriented to person, place, and time. She appears well-developed and well-nourished. No distress.  HENT:  Head: Normocephalic and atraumatic.  Eyes: Conjunctivae and EOM are normal.  Cardiovascular: Normal rate and regular rhythm.   Pulmonary/Chest: Effort normal and breath sounds normal. No stridor. No respiratory distress. She has no wheezes. She has no rales.  Abdominal: She exhibits no distension.  Minimal diffuse tenderness, no guarding, no rebound, no peritoneal findings  Musculoskeletal: She exhibits no edema.  Neurological: She is alert and oriented to person, place, and time. No cranial nerve deficit.  Skin:  Skin is warm and dry.  Psychiatric: She has a normal mood and affect.  Nursing note and vitals reviewed.   ED Course  Procedures (including critical care time) Labs Review Labs Reviewed  URINALYSIS, ROUTINE W REFLEX MICROSCOPIC (NOT AT [facility]) - Abnormal; Notable for the following:    APPearance CLOUDY (*)    Hgb urine dipstick LARGE (*)    Ketones, ur 15 (*)    Protein, ur 100 (*)    All other components within normal limits  URINE RAPID DRUG SCREEN, HOSP PERFORMED - Abnormal; Notable for the following:    Opiates POSITIVE (*)    Cocaine POSITIVE (*)    Tetrahydrocannabinol POSITIVE (*)    All other components within normal limits  URINE MICROSCOPIC-ADD ON - Abnormal; Notable for the following:    Squamous Epithelial / LPF 0-5 (*)    Bacteria, UA RARE (*)    All other components within normal limits  LIPASE, BLOOD  COMPREHENSIVE METABOLIC PANEL  CBC  ETHANOL   I have personally reviewed and evaluated these lab results as part of my medical decision-making.  Chart review notable for 7 prior similar episodes, including 2 weeks ago, I evaluated the patient.  8:45 PM Patient now having blood tinged emesis.  Pharmacy tech notes that on interview, the patient has has not picked up any medications prescribed recently, including those are prescribed 3 weeks ago. Patient's initial labs notable for positive urinary tox screen with opiates, cocaine, THC.   9:15 PM Patient sitting upright, drinking ginger ale   10:59 PM No additional vomiting. The patient and I had a lengthy conversation about her episodes of nausea, vomiting, likely connection with her substance abuse. Patient states that she has not followed up with primary care secondary to monetary concerns or not she has also not received all her medications in the past for similar concerns. We discussed the availability of financial assistance, the need to follow-up with our affiliated primary care center. MDM  She presents  with concern of ongoing nausea, vomiting. Here she is awake and alert, though uncomfortable appearing. She is afebrile, hemodynamically stable. Patient has history of episodic nausea, vomiting, ongoing abdominal pain. In the past this has been attributed to her polysubstance abuse, gastroparesis. Here, the patient's symptoms are well controlled, she is no evidence for ongoing bleed, sustained abdominal pain, hemodynamically instability. Patient was encouraged to follow-up with primary care provider is, in part to discuss substance use, but Also to Consider Appropriate Ongoing Management of Her Episodic Nausea, Vomiting.  Gerhard Munch, MD 02/14/16 [phone]

## 2016-02-14 NOTE — Discharge Instructions (Signed)
As discussed, your recurring episodes of vomiting, are likely due to your substance abuse.  Is very important that he follow up with our primary care physicians for appropriate ongoing management of your condition.  Return here for concerning changes.   Nausea and Vomiting Nausea is a sick feeling that often comes before throwing up (vomiting). Vomiting is a reflex where stomach contents come out of your mouth. Vomiting can cause severe loss of body fluids (dehydration). Children and elderly adults can become dehydrated quickly, especially if they also have diarrhea. Nausea and vomiting are symptoms of a condition or disease. It is important to find the cause of your symptoms. CAUSES   Direct irritation of the stomach lining. This irritation can result from increased acid production (gastroesophageal reflux disease), infection, food poisoning, taking certain medicines (such as nonsteroidal anti-inflammatory drugs), alcohol use, or tobacco use.  Signals from the brain.These signals could be caused by a headache, heat exposure, an inner ear disturbance, increased pressure in the brain from injury, infection, a tumor, or a concussion, pain, emotional stimulus, or metabolic problems.  An obstruction in the gastrointestinal tract (bowel obstruction).  Illnesses such as diabetes, hepatitis, gallbladder problems, appendicitis, kidney problems, cancer, sepsis, atypical symptoms of a heart attack, or eating disorders.  Medical treatments such as chemotherapy and radiation.  Receiving medicine that makes you sleep (general anesthetic) during surgery. DIAGNOSIS Your caregiver may ask for tests to be done if the problems do not improve after a few days. Tests may also be done if symptoms are severe or if the reason for the nausea and vomiting is not clear. Tests may include:  Urine tests.  Blood tests.  Stool tests.  Cultures (to look for evidence of infection).  X-rays or other imaging  studies. Test results can help your caregiver make decisions about treatment or the need for additional tests. TREATMENT You need to stay well hydrated. Drink frequently but in small amounts.You may wish to drink water, sports drinks, clear broth, or eat frozen ice pops or gelatin dessert to help stay hydrated.When you eat, eating slowly may help prevent nausea.There are also some antinausea medicines that may help prevent nausea. HOME CARE INSTRUCTIONS   Take all medicine as directed by your caregiver.  If you do not have an appetite, do not force yourself to eat. However, you must continue to drink fluids.  If you have an appetite, eat a normal diet unless your caregiver tells you differently.  Eat a variety of complex carbohydrates (rice, wheat, potatoes, bread), lean meats, yogurt, fruits, and vegetables.  Avoid high-fat foods because they are more difficult to digest.  Drink enough water and fluids to keep your urine clear or pale yellow.  If you are dehydrated, ask your caregiver for specific rehydration instructions. Signs of dehydration may include:  Severe thirst.  Dry lips and mouth.  Dizziness.  Dark urine.  Decreasing urine frequency and amount.  Confusion.  Rapid breathing or pulse. SEEK IMMEDIATE MEDICAL CARE IF:   You have blood or brown flecks (like coffee grounds) in your vomit.  You have black or bloody stools.  You have a severe headache or stiff neck.  You are confused.  You have severe abdominal pain.  You have chest pain or trouble breathing.  You do not urinate at least once every 8 hours.  You develop cold or clammy skin.  You continue to vomit for longer than 24 to 48 hours.  You have a fever. MAKE SURE YOU:   Understand  these instructions.  Will watch your condition.  Will get help right away if you are not doing well or get worse.   This information is not intended to replace advice given to you by your health care provider.  Make sure you discuss any questions you have with your health care provider.   Document Released: 11/08/2005 Document Revised: 01/31/2012 Document Reviewed: 04/07/2011 Elsevier Interactive Patient Education [employer].

## 2016-02-14 NOTE — ED Notes (Signed)
She c/o n/v/abd pain, hot and cold sweats since waking this am. She states she has not voided today.

## 2016-09-04 ENCOUNTER — Encounter (HOSPITAL_COMMUNITY): Payer: Self-pay | Admitting: Emergency Medicine

## 2016-09-04 ENCOUNTER — Emergency Department (HOSPITAL_COMMUNITY)
Admission: EM | Admit: 2016-09-04 | Discharge: 2016-09-04 | Disposition: A | Discharge status: Home / Self Care | Payer: Self-pay | Attending: Emergency Medicine | Admitting: Emergency Medicine

## 2016-09-04 DIAGNOSIS — I10 Essential (primary) hypertension: Secondary | ICD-10-CM | POA: Insufficient documentation

## 2016-09-04 DIAGNOSIS — F17210 Nicotine dependence, cigarettes, uncomplicated: Secondary | ICD-10-CM | POA: Insufficient documentation

## 2016-09-04 DIAGNOSIS — K029 Dental caries, unspecified: Secondary | ICD-10-CM | POA: Insufficient documentation

## 2016-09-04 DIAGNOSIS — R220 Localized swelling, mass and lump, head: Secondary | ICD-10-CM

## 2016-09-04 MED ORDER — PENICILLIN V POTASSIUM 500 MG PO TABS: 500.0000 mg | ORAL_TABLET | Freq: Two times a day (BID) | ORAL | 0 refills | Status: AC

## 2016-09-04 MED ORDER — IBUPROFEN 600 MG PO TABS: 600.0000 mg | ORAL_TABLET | Freq: Four times a day (QID) | ORAL | 0 refills | Status: DC | PRN

## 2016-09-04 NOTE — Discharge Instructions (Signed)
Take your medications as prescribed. You may also gargle warm salt water 2-3 times daily. Follow up with one of the dental clinics listed below for further management. Please return to the Emergency Department if symptoms worsen or new onset of fever, facial/neck swelling, difficulty swallowing resulting indrawing, voice change, difficulty breathing, vomiting, unable to keep fluids down.   [facility] [facility] [employer] [facility] [address] [place], [place] [address] Phone [phone]  [facility] [employer] [facility] in [place] exemplifies the Dental School?s vision to improve the health and quality of life of all [place] by Public house managercreating leaders with a passion to care for the underserved and by leading the nation in community-based, service learning oral health education.  We are committed to offering comprehensive general dental services for adults, children and special needs patients in a safe, caring and professional setting.   Appointments: Our clinic is open Monday through Friday 8:00 a.m. until 5:00 p.m. The amount of time scheduled for an appointment depends on the patient?s specific needs. We ask that you keep your appointed time for care or provide 24-hour notice of all appointment changes. Parents or legal guardians must accompany minor children.   Payment for Services: Medicaid and other insurance plans are welcome. Payment for services is due when services are rendered and may be made by cash or credit card. If you have dental insurance, we will assist you with your claim submission.    Emergencies:  Emergency services will be provided Monday through Friday on a walk-in basis.  Please arrive early for emergency services. After hours emergency services will be provided for patients of record as required.   Services:  Comprehensive  General Dentistry Children?s Dentistry Oral Surgery - Extractions Root Canals Sealants and Tooth Colored Fillings Crowns and [employer] [facility]ridges Dentures and Partial Dentures Implant Services Periodontal Services and Cleanings Cosmetic Tooth Whitening Digital Radiography 3-D/[facility]

## 2016-09-04 NOTE — ED Triage Notes (Signed)
C/o "knot" on soft palate of mouth that she noticed today.

## 2016-09-04 NOTE — ED Provider Notes (Signed)
[facility]-EMERGENCY DEPT Provider Note   CSN: [id number] Arrival date & time: 09/04/16  [id number]     History   Chief Complaint Chief Complaint  Patient presents with  . knot in mouth    HPI Taylor Bartlett is a 30 y.o. female.  Patient is a 30 year old female with no pertinent past medical history who presents the ED with complaint of gum swelling, onset yesterday morning. Patient reports when she woke up yesterday morning she began to notice small area of swelling noted to her gums surrounding her left upper molar. Endorses mild associated pain/discomfort. She notes she took ibuprofen at home with mild intermittent relief. Denies fever, facial/neck swelling, trismus, drooling, dysphasia, difficulty breathing, drainage, change in voice. Patient reports having mild intermittent pain to her right upper molar and states that she has needed to have it pulled for a while now but has been unable to see a dentist.      Past Medical History:  Diagnosis Date  . Alcohol abuse   . Anxiety   . Hypertension   . Reflux   . Seasonal allergies     Patient Active Problem List   Diagnosis Date Noted  . Intractable cyclical vomiting with nausea   . Depression   . Hypokalemia   . Polysubstance abuse   . Bipolar affective disorder, currently depressed, mild (HCC)   . Cyclic vomiting syndrome 09/22/2015    History reviewed. No pertinent surgical history.  OB History    No data available       Home Medications    Prior to Admission medications   Medication Sig Start Date End Date Taking? Authorizing Provider  hydrOXYzine (ATARAX/VISTARIL) 25 MG tablet Take 25 mg by mouth 2 (two) times daily.    Historical Provider, MD  ibuprofen (ADVIL,MOTRIN) 800 MG tablet Take 1 tablet (800 mg total) by mouth 3 (three) times daily. Patient not taking: Reported on 01/27/2016 11/02/15   Hillard DankerShawn C Joy, PA-C  metoCLOPramide (REGLAN) 10 MG tablet Take 1 tablet (10 mg total) by mouth every 6 (six) hours as  needed for nausea (nausea/headache). Patient not taking: Reported on 10/26/2015 09/24/15   Fuller Planhristopher W Rice, MD  nicotine (NICODERM CQ - DOSED IN MG/24 HOURS) 21 mg/24hr patch Place 1 patch (21 mg total) onto the skin daily. Patient not taking: Reported on 10/26/2015 09/24/15   Fuller Planhristopher W Rice, MD  OLANZapine (ZYPREXA) 15 MG tablet Take 15 mg by mouth at bedtime.    Historical Provider, MD  omeprazole (PRILOSEC) 20 MG capsule Take 1 capsule (20 mg total) by mouth daily. Patient taking differently: Take 20 mg by mouth daily as needed (heartburn).  09/24/15   Fuller Planhristopher W Rice, MD  ondansetron (ZOFRAN ODT) 4 MG disintegrating tablet Take 1 tablet (4 mg total) by mouth every 8 (eight) hours as needed for nausea or vomiting. 02/14/16   Gerhard Munchobert Lockwood, MD    Family History No family history on file.  Social History Social History  Substance Use Topics  . Smoking status: Current Every Day Smoker    Packs/day: 0.50    Types: Cigarettes  . Smokeless tobacco: Never Used  . Alcohol use 1.8 oz/week    3 Cans of beer per week     Allergies   Tomato   Review of Systems Review of Systems  Constitutional: Negative for fever.  HENT: Positive for dental problem. Negative for facial swelling and trouble swallowing.   Respiratory: Negative for shortness of breath.   Gastrointestinal: Negative for vomiting.  Neurological: Negative for headaches.     Physical Exam Updated Vital Signs BP (!) 151/110 (BP Location: Right Arm)   Pulse 70   Temp 98 F (36.7 C) (Oral)   Resp 18   SpO2 99%   Physical Exam  Constitutional: She is oriented to person, place, and time. She appears well-developed and well-nourished. No distress.  HENT:  Head: Normocephalic and atraumatic.  Mouth/Throat: Uvula is midline, oropharynx is clear and moist and mucous membranes are normal. No trismus in the jaw. Abnormal dentition. Dental caries present. No dental abscesses or uvula swelling. No oropharyngeal exudate,  posterior oropharyngeal edema, posterior oropharyngeal erythema or tonsillar abscesses. No tonsillar exudate.    No facial or neck swelling. Pt tolerating secretions.  Eyes: Conjunctivae and EOM are normal. Right eye exhibits no discharge. Left eye exhibits no discharge. No scleral icterus.  Neck: Normal range of motion. Neck supple.  Cardiovascular: Normal rate.   Pulmonary/Chest: Effort normal. No respiratory distress.  Abdominal: She exhibits no distension.  Musculoskeletal: Normal range of motion.  Lymphadenopathy:    She has no cervical adenopathy.  Neurological: She is alert and oriented to person, place, and time.  Skin: She is not diaphoretic.  Nursing note and vitals reviewed.    ED Treatments / Results  Labs (all labs ordered are listed, but only abnormal results are displayed) Labs Reviewed - No data to display  EKG  EKG Interpretation None       Radiology No results found.  Procedures Procedures (including critical care time)  Medications Ordered in ED Medications - No data to display   Initial Impression / Assessment and Plan / ED Course  I have reviewed the triage vital signs and the nursing notes.  Pertinent labs & imaging results that were available during my care of the patient were reviewed by me and considered in my medical decision making (see chart for details).  Clinical Course    Patient with gum swelling.  No gross abscess, decaying tooth #15 with mild swelling and induration noted to posterior surrounding gingiva.  Exam unconcerning for Ludwig's angina or spread of infection.  Will treat with penicillin and pain medicine.  Urged patient to follow-up with dentist.     Final Clinical Impressions(s) / ED Diagnoses   Final diagnoses:  None    New Prescriptions New Prescriptions   No medications on file     Barrett Henle, PA-C 09/04/16 1610    Glynn Octave, MD 09/04/16 [phone]

## 2016-09-18 ENCOUNTER — Encounter (HOSPITAL_COMMUNITY): Payer: Self-pay | Admitting: *Deleted

## 2016-09-18 ENCOUNTER — Emergency Department (HOSPITAL_COMMUNITY)
Admission: EM | Admit: 2016-09-18 | Discharge: 2016-09-18 | Disposition: A | Discharge status: Home / Self Care | Payer: Self-pay | Attending: Emergency Medicine | Admitting: Emergency Medicine

## 2016-09-18 DIAGNOSIS — F17210 Nicotine dependence, cigarettes, uncomplicated: Secondary | ICD-10-CM | POA: Insufficient documentation

## 2016-09-18 DIAGNOSIS — I10 Essential (primary) hypertension: Secondary | ICD-10-CM | POA: Insufficient documentation

## 2016-09-18 DIAGNOSIS — Z79899 Other long term (current) drug therapy: Secondary | ICD-10-CM | POA: Insufficient documentation

## 2016-09-18 DIAGNOSIS — R112 Nausea with vomiting, unspecified: Secondary | ICD-10-CM | POA: Insufficient documentation

## 2016-09-18 DIAGNOSIS — K3184 Gastroparesis: Secondary | ICD-10-CM | POA: Insufficient documentation

## 2016-09-18 LAB — RAPID URINE DRUG SCREEN, HOSP PERFORMED
Amphetamines: NOT DETECTED
Barbiturates: NOT DETECTED
Benzodiazepines: NOT DETECTED
Cocaine: NOT DETECTED
Opiates: NOT DETECTED
Tetrahydrocannabinol: POSITIVE — AB

## 2016-09-18 LAB — URINALYSIS, ROUTINE W REFLEX MICROSCOPIC
GLUCOSE, UA: NEGATIVE mg/dL
HGB URINE DIPSTICK: NEGATIVE
Ketones, ur: 15 mg/dL — AB
Nitrite: NEGATIVE
PH: 6.5 (ref 5.0–8.0)
Protein, ur: 100 mg/dL — AB
Specific Gravity, Urine: 1.027 (ref 1.005–1.030)

## 2016-09-18 LAB — I-STAT BETA HCG BLOOD, ED (MC, WL, AP ONLY)

## 2016-09-18 LAB — COMPREHENSIVE METABOLIC PANEL
ALT: 18 U/L (ref 14–54)
AST: 30 U/L (ref 15–41)
Albumin: 5 g/dL (ref 3.5–5.0)
Alkaline Phosphatase: 70 U/L (ref 38–126)
Anion gap: 14 (ref 5–15)
BUN: 13 mg/dL (ref 6–20)
CHLORIDE: 97 mmol/L — AB (ref 101–111)
CO2: 23 mmol/L (ref 22–32)
Calcium: 10.7 mg/dL — ABNORMAL HIGH (ref 8.9–10.3)
Creatinine, Ser: 1.07 mg/dL — ABNORMAL HIGH (ref 0.44–1.00)
GFR calc Af Amer: >60 mL/min (ref >60)
Glucose, Bld: 151 mg/dL — ABNORMAL HIGH (ref 65–99)
POTASSIUM: 4.3 mmol/L (ref 3.5–5.1)
SODIUM: 134 mmol/L — AB (ref 135–145)
Total Bilirubin: 0.6 mg/dL (ref 0.3–1.2)
Total Protein: 8.9 g/dL — ABNORMAL HIGH (ref 6.5–8.1)

## 2016-09-18 LAB — URINE MICROSCOPIC-ADD ON

## 2016-09-18 LAB — CBC
HEMATOCRIT: 43.1 % (ref 36.0–46.0)
Hemoglobin: 14.6 g/dL (ref 12.0–15.0)
MCH: 31.5 pg (ref 26.0–34.0)
MCHC: 33.9 g/dL (ref 30.0–36.0)
MCV: 92.9 fL (ref 78.0–100.0)
Platelets: 233 10*3/uL (ref 150–400)
RBC: 4.64 MIL/uL (ref 3.87–5.11)
RDW: 13 % (ref 11.5–15.5)
WBC: 16.5 10*3/uL — AB (ref 4.0–10.5)

## 2016-09-18 LAB — LIPASE, BLOOD: LIPASE: 23 U/L (ref 11–51)

## 2016-09-18 MED ORDER — ONDANSETRON HCL 4 MG/2ML IJ SOLN: 4.0000 mg | Freq: Once | INTRAMUSCULAR | Status: DC | PRN

## 2016-09-18 MED ORDER — METOCLOPRAMIDE HCL 10 MG PO TABS: 10.0000 mg | ORAL_TABLET | Freq: Four times a day (QID) | ORAL | 0 refills | Status: DC | PRN

## 2016-09-18 MED ORDER — SODIUM CHLORIDE 0.9 % IV BOLUS (SEPSIS)
1000.0000 mL | Freq: Once | INTRAVENOUS | Status: AC
  Administered 2016-09-18: 1000 mL via INTRAVENOUS

## 2016-09-18 MED ORDER — PROMETHAZINE HCL 25 MG RE SUPP: 25.0000 mg | Freq: Four times a day (QID) | RECTAL | 0 refills | Status: DC | PRN

## 2016-09-18 MED ORDER — METOCLOPRAMIDE HCL 5 MG/ML IJ SOLN
10.0000 mg | Freq: Once | INTRAMUSCULAR | Status: AC
  Administered 2016-09-18: 10 mg via INTRAVENOUS
  Filled 2016-09-18: qty 2

## 2016-09-18 MED ORDER — ONDANSETRON HCL 4 MG/2ML IJ SOLN
4.0000 mg | Freq: Once | INTRAMUSCULAR | Status: AC
  Administered 2016-09-18: 4 mg via INTRAVENOUS
  Filled 2016-09-18: qty 2

## 2016-09-18 MED ORDER — HALOPERIDOL LACTATE 5 MG/ML IJ SOLN
5.0000 mg | Freq: Once | INTRAMUSCULAR | Status: AC
  Administered 2016-09-18: 5 mg via INTRAVENOUS
  Filled 2016-09-18: qty 1

## 2016-09-18 MED ORDER — DICYCLOMINE HCL 10 MG/ML IM SOLN
20.0000 mg | Freq: Once | INTRAMUSCULAR | Status: AC
  Administered 2016-09-18: 20 mg via INTRAMUSCULAR
  Filled 2016-09-18: qty 2

## 2016-09-18 MED ORDER — KETOROLAC TROMETHAMINE 30 MG/ML IJ SOLN
30.0000 mg | Freq: Once | INTRAMUSCULAR | Status: AC
  Administered 2016-09-18: 30 mg via INTRAVENOUS
  Filled 2016-09-18: qty 1

## 2016-09-18 NOTE — ED Provider Notes (Signed)
  Physical Exam  BP (!) 156/112 (BP Location: Left Arm)   Pulse 101   Temp 97.9 F (36.6 C) (Oral)   Wt 60.9 kg   SpO2 100%   BMI 23.78 kg/m   Physical Exam  ED Course  Procedures  MDM History of gastroparesis, diffuse abdominal pain and vomiting. Doing ok after zofran and reglan. She is getting PO challenged and is going home. Awaiting urine pregnancy and she will be discharged.  Urine pregnancy test negative. She was given a dose of haldol and toradol and ultimately had symptomatic improvement, was able to tolerate ginger ale. Abdomen soft and non-tender on repeat examination. She was given a prescription for reglan and phenergan suppositories and return precautions for woresning symptoms. She expressed understanding and is in good condition for discharge home.      Preston FleetingAnna Icie Kuznicki, MD 09/19/16 2030    Lyndal Pulleyaniel Knott, MD 09/20/16 1340

## 2016-09-18 NOTE — ED Provider Notes (Signed)
[facility] Provider Note   CSN: [id number] Arrival date & time: 09/18/16  1250     History   Chief Complaint Chief Complaint  Patient presents with  . Emesis    for 3 days  . Abdominal Pain    HPI Taylor Bartlett is a 30 y.o. female.  HPI    30 year old female presents today with complaints of nausea vomiting and abdominal pain. Patient reports symptoms started approximately 3 days ago with numerous episodes of vomiting. Patient reports that she has recurrent episodes of the same, reports this feels identical with abdominal cramping and diffuse pain. Patient denies any fever at home, denies any diarrhea, reports last bowel movement 2 days ago, little by mouth intake. Patient reports antinausea medication at home, but that she's been throwing up.   Past Medical History:  Diagnosis Date  . Alcohol abuse   . Anxiety   . Hypertension   . Reflux   . Seasonal allergies     Patient Active Problem List   Diagnosis Date Noted  . Intractable cyclical vomiting with nausea   . Depression   . Hypokalemia   . Polysubstance abuse   . Bipolar affective disorder, currently depressed, mild (HCC)   . Cyclic vomiting syndrome 09/22/2015    History reviewed. No pertinent surgical history.  OB History    No data available      Home Medications    Prior to Admission medications   Medication Sig Start Date End Date Taking? Authorizing Provider  hydrochlorothiazide (HYDRODIURIL) 25 MG tablet Take 25 mg by mouth daily.   Yes Historical Provider, MD  hydrOXYzine (ATARAX/VISTARIL) 50 MG tablet Take 50 mg by mouth 2 (two) times daily.    Yes Historical Provider, MD  lisinopril (PRINIVIL,ZESTRIL) 10 MG tablet Take 10 mg by mouth daily.   Yes Historical Provider, MD  OLANZapine (ZYPREXA) 10 MG tablet Take 10 mg by mouth at bedtime.    Yes Historical Provider, MD  ondansetron (ZOFRAN ODT) 4 MG disintegrating tablet Take 1 tablet (4 mg total) by mouth every 8 (eight) hours as  needed for nausea or vomiting. 02/14/16  Yes Gerhard Munchobert Lockwood, MD  ibuprofen (ADVIL,MOTRIN) 600 MG tablet Take 1 tablet (600 mg total) by mouth every 6 (six) hours as needed. Patient not taking: Reported on 09/18/2016 09/04/16   Barrett HenleNicole Elizabeth Nadeau, PA-C  metoCLOPramide (REGLAN) 10 MG tablet Take 1 tablet (10 mg total) by mouth every 6 (six) hours as needed for nausea (nausea/headache). Patient not taking: Reported on 09/18/2016 09/24/15   Fuller Planhristopher W Rice, MD  nicotine (NICODERM CQ - DOSED IN MG/24 HOURS) 21 mg/24hr patch Place 1 patch (21 mg total) onto the skin daily. Patient not taking: Reported on 09/18/2016 09/24/15   Fuller Planhristopher W Rice, MD  omeprazole (PRILOSEC) 20 MG capsule Take 1 capsule (20 mg total) by mouth daily. Patient not taking: Reported on 09/18/2016 09/24/15   Fuller Planhristopher W Rice, MD    Family History No family history on file.  Social History Social History  Substance Use Topics  . Smoking status: Current Every Day Smoker    Packs/day: 0.50    Types: Cigarettes  . Smokeless tobacco: Never Used  . Alcohol use 1.8 oz/week    3 Cans of beer per week     Allergies   Tomato   Review of Systems Review of Systems  All other systems reviewed and are negative.   Physical Exam Updated Vital Signs BP (!) 156/112 (BP Location: Left Arm)  Pulse 101   Temp 97.9 F (36.6 C) (Oral)   Wt 60.9 kg   SpO2 100%   BMI 23.78 kg/m   Physical Exam  Constitutional: She is oriented to person, place, and time. She appears well-developed and well-nourished.  HENT:  Head: Normocephalic and atraumatic.  Eyes: Conjunctivae are normal. Pupils are equal, round, and reactive to light. Right eye exhibits no discharge. Left eye exhibits no discharge. No scleral icterus.  Neck: Normal range of motion. No JVD present. No tracheal deviation present.  Pulmonary/Chest: Effort normal. No stridor.  Abdominal: Soft. She exhibits no distension and no mass. There is tenderness. There is  no rebound and no guarding. No hernia.  Neurological: She is alert and oriented to person, place, and time. Coordination normal.  Skin: Skin is warm.  Psychiatric: She has a normal mood and affect. Her behavior is normal. Judgment and thought content normal.  Nursing note and vitals reviewed.   ED Treatments / Results  Labs (all labs ordered are listed, but only abnormal results are displayed) Labs Reviewed  COMPREHENSIVE METABOLIC PANEL - Abnormal; Notable for the following:       Result Value   Sodium 134 (*)    Chloride 97 (*)    Glucose, Bld 151 (*)    Creatinine, Ser 1.07 (*)    Calcium 10.7 (*)    Total Protein 8.9 (*)    All other components within normal limits  CBC - Abnormal; Notable for the following:    WBC 16.5 (*)    All other components within normal limits  LIPASE, BLOOD  URINALYSIS, ROUTINE W REFLEX MICROSCOPIC (NOT AT [facility])  RAPID URINE DRUG SCREEN, HOSP PERFORMED  POC URINE PREG, ED    EKG  EKG Interpretation None       Radiology No results found.  Procedures Procedures (including critical care time)  Medications Ordered in ED Medications  ondansetron (ZOFRAN) injection 4 mg (not administered)  sodium chloride 0.9 % bolus 1,000 mL (1,000 mLs Intravenous New Bag/Given 09/18/16 1513)  ondansetron (ZOFRAN) injection 4 mg (4 mg Intravenous Given 09/18/16 1513)  dicyclomine (BENTYL) injection 20 mg (20 mg Intramuscular Given 09/18/16 1518)  metoCLOPramide (REGLAN) injection 10 mg (10 mg Intravenous Given 09/18/16 1624)     Initial Impression / Assessment and Plan / ED Course  I have reviewed the triage vital signs and the nursing notes.  Pertinent labs & imaging results that were available during my care of the patient were reviewed by me and considered in my medical decision making (see chart for details).  Clinical Course     Final Clinical Impressions(s) / ED Diagnoses   Final diagnoses:  Gastroparesis  Non-intractable vomiting with  nausea, unspecified vomiting type    Labs: Lipase, CMP, CBC, urine pregnant, urine drug  Imaging: She has no signs of  Consults:  Therapeutics: Zofran, Reglan, bentyl  Discharge Meds:   Assessment/Plan: 30 year old female presents today with nausea vomiting and abdominal pain. This presentation seems similar to previous. Patient vomiting upon my initial evaluation, she was given normal saline and antibiotics which seemed to improve her symptoms. Patient has no significant electrolyte abnormalities, small elevation in WBC. Pt appeared to be doing well no no more vomiting. Pt would not give urine sample for pregnancy test, she denies chance of pregnancy. When telling patient that she will be discharged home with nausea medication she started to crying and would not continue a conversation with me.   Pt appears stable for discharge, this episode  appears identical to previous. Patient will be discharged pending urine pregnancy. Pt care signed out pending urine pregnancy    New Prescriptions New Prescriptions   No medications on file     Eyvonne Mechanic, PA-C 09/19/16 1536    Geoffery Lyons, MD 09/23/16 1318

## 2016-09-18 NOTE — ED Notes (Signed)
Pt continues to spit out red tinged sputum. Pt ambulating rapidly and steadily from department after d/c with family.

## 2016-09-18 NOTE — ED Notes (Addendum)
Pt requesting pain meds.  Hot packs given.

## 2016-09-18 NOTE — Discharge Instructions (Signed)
Please read attached information. If you experience any new or worsening signs or symptoms please return to the emergency room for evaluation. Please follow-up with your primary care provider or specialist as discussed. Please use medication prescribed only as directed and discontinue taking if you have any concerning signs or symptoms.   °

## 2016-09-18 NOTE — ED Triage Notes (Addendum)
Patient has had n/v for the past 3 days with abd pain.  She states her last bm was 3 days ago.  She has not been able to keep anything down.  She is vomitting in triage.  Noted to have what looks like dark blood.  Patient states she has not had etoh for a few days.   Patient has lost 6 pounds this week  Patient has been taking zofran at home.  Last dose one hour ago but states she vomitted after

## 2016-09-20 ENCOUNTER — Emergency Department (HOSPITAL_COMMUNITY)
Admission: EM | Admit: 2016-09-20 | Discharge: 2016-09-20 | Disposition: A | Discharge status: Home / Self Care | Payer: Self-pay | Attending: Emergency Medicine | Admitting: Emergency Medicine

## 2016-09-20 ENCOUNTER — Encounter (HOSPITAL_COMMUNITY): Payer: Self-pay | Admitting: Emergency Medicine

## 2016-09-20 DIAGNOSIS — I10 Essential (primary) hypertension: Secondary | ICD-10-CM | POA: Insufficient documentation

## 2016-09-20 DIAGNOSIS — R1115 Cyclical vomiting syndrome unrelated to migraine: Secondary | ICD-10-CM

## 2016-09-20 DIAGNOSIS — R112 Nausea with vomiting, unspecified: Secondary | ICD-10-CM | POA: Insufficient documentation

## 2016-09-20 DIAGNOSIS — Z79899 Other long term (current) drug therapy: Secondary | ICD-10-CM | POA: Insufficient documentation

## 2016-09-20 DIAGNOSIS — F17210 Nicotine dependence, cigarettes, uncomplicated: Secondary | ICD-10-CM | POA: Insufficient documentation

## 2016-09-20 MED ORDER — KETOROLAC TROMETHAMINE 30 MG/ML IJ SOLN
30.0000 mg | Freq: Once | INTRAMUSCULAR | Status: AC
  Administered 2016-09-20: 30 mg via INTRAVENOUS
  Filled 2016-09-20: qty 1

## 2016-09-20 MED ORDER — HALOPERIDOL LACTATE 5 MG/ML IJ SOLN
5.0000 mg | Freq: Once | INTRAMUSCULAR | Status: AC
  Administered 2016-09-20: 5 mg via INTRAVENOUS
  Filled 2016-09-20: qty 1

## 2016-09-20 MED ORDER — SODIUM CHLORIDE 0.9 % IV BOLUS (SEPSIS)
1000.0000 mL | Freq: Once | INTRAVENOUS | Status: AC
  Administered 2016-09-20: 1000 mL via INTRAVENOUS

## 2016-09-20 MED ORDER — LORAZEPAM 2 MG/ML IJ SOLN
1.0000 mg | Freq: Once | INTRAMUSCULAR | Status: AC
Start: 2016-09-20 — End: 2016-09-20
  Administered 2016-09-20: 1 mg via INTRAVENOUS
  Filled 2016-09-20: qty 1

## 2016-09-20 NOTE — ED Provider Notes (Signed)
[facility]-EMERGENCY DEPT Provider Note   CSN: [id number] Arrival date & time: 09/20/16  1651     History   Chief Complaint Chief Complaint  Patient presents with  . Abdominal Pain    HPI Taylor Bartlett is a 30 y.o. female.  The history is provided by the patient.  Emesis   This is a recurrent problem. The current episode started 2 days ago. The problem occurs continuously. The problem has not changed since onset.The emesis has an appearance of stomach contents. There has been no fever. Associated symptoms include abdominal pain (diffuse after vomiting). Pertinent negatives include no chills, no fever and no URI. Risk factors: marijuana use.    Past Medical History:  Diagnosis Date  . Alcohol abuse   . Anxiety   . Hypertension   . Reflux   . Seasonal allergies     Patient Active Problem List   Diagnosis Date Noted  . Intractable cyclical vomiting with nausea   . Depression   . Hypokalemia   . Polysubstance abuse   . Bipolar affective disorder, currently depressed, mild (HCC)   . Cyclic vomiting syndrome 09/22/2015    History reviewed. No pertinent surgical history.  OB History    No data available       Home Medications    Prior to Admission medications   Medication Sig Start Date End Date Taking? Authorizing Provider  hydrochlorothiazide (HYDRODIURIL) 25 MG tablet Take 25 mg by mouth at bedtime.    Yes Historical Provider, MD  hydrOXYzine (ATARAX/VISTARIL) 50 MG tablet Take 50 mg by mouth 2 (two) times daily.    Yes Historical Provider, MD  lisinopril (PRINIVIL,ZESTRIL) 10 MG tablet Take 10 mg by mouth daily.   Yes Historical Provider, MD  metoCLOPramide (REGLAN) 10 MG tablet Take 1 tablet (10 mg total) by mouth every 6 (six) hours as needed for nausea (nausea/headache). 09/18/16  Yes Preston FleetingAnna Schubert, MD  OLANZapine (ZYPREXA) 10 MG tablet Take 10 mg by mouth at bedtime.    Yes Historical Provider, MD  promethazine (PHENERGAN) 25 MG suppository Place 1  suppository (25 mg total) rectally every 6 (six) hours as needed for nausea or vomiting. 09/18/16  Yes Jeffrey Hedges, PA-C  ibuprofen (ADVIL,MOTRIN) 600 MG tablet Take 1 tablet (600 mg total) by mouth every 6 (six) hours as needed. Patient not taking: Reported on 09/20/2016 09/04/16   Barrett HenleNicole Elizabeth Nadeau, PA-C  nicotine (NICODERM CQ - DOSED IN MG/24 HOURS) 21 mg/24hr patch Place 1 patch (21 mg total) onto the skin daily. Patient not taking: Reported on 09/20/2016 09/24/15   Fuller Planhristopher W Rice, MD  omeprazole (PRILOSEC) 20 MG capsule Take 1 capsule (20 mg total) by mouth daily. Patient not taking: Reported on 09/20/2016 09/24/15   Fuller Planhristopher W Rice, MD  ondansetron (ZOFRAN ODT) 4 MG disintegrating tablet Take 1 tablet (4 mg total) by mouth every 8 (eight) hours as needed for nausea or vomiting. Patient not taking: Reported on 09/20/2016 02/14/16   Gerhard Munchobert Lockwood, MD    Family History History reviewed. No pertinent family history.  Social History Social History  Substance Use Topics  . Smoking status: Current Every Day Smoker    Packs/day: 0.50    Types: Cigarettes  . Smokeless tobacco: Never Used  . Alcohol use 1.8 oz/week    3 Cans of beer per week     Allergies   Tomato   Review of Systems Review of Systems  Constitutional: Negative for chills and fever.  Gastrointestinal: Positive for abdominal pain (  diffuse after vomiting) and vomiting.  All other systems reviewed and are negative.    Physical Exam Updated Vital Signs BP (!) 143/107 (BP Location: Right Arm)   Pulse 100   Temp 98.3 F (36.8 C) (Oral)   Resp 19   SpO2 98%   Physical Exam  Constitutional: She is oriented to person, place, and time. She appears well-developed and well-nourished. She appears distressed (emotionally distraight spitting into trash can).  HENT:  Head: Normocephalic.  Nose: Nose normal.  Eyes: Conjunctivae are normal.  Neck: Neck supple. No tracheal deviation present.    Cardiovascular: Normal rate, regular rhythm and normal heart sounds.   Pulmonary/Chest: Effort normal and breath sounds normal. No respiratory distress.  Abdominal: Soft. She exhibits no distension. There is no tenderness. There is no rebound and no guarding.  Neurological: She is alert and oriented to person, place, and time.  Skin: Skin is warm and dry.  Psychiatric: She has a normal mood and affect.     ED Treatments / Results  Labs (all labs ordered are listed, but only abnormal results are displayed) Labs Reviewed - No data to display  EKG  EKG Interpretation None       Radiology No results found.  Procedures Procedures (including critical care time)  Medications Ordered in ED Medications  ketorolac (TORADOL) 30 MG/ML injection 30 mg (not administered)  LORazepam (ATIVAN) injection 1 mg (not administered)  haloperidol lactate (HALDOL) injection 5 mg (5 mg Intravenous Given 09/20/16 2047)  sodium chloride 0.9 % bolus 1,000 mL (1,000 mLs Intravenous New Bag/Given 09/20/16 2046)     Initial Impression / Assessment and Plan / ED Course  I have reviewed the triage vital signs and the nursing notes.  Pertinent labs & imaging results that were available during my care of the patient were reviewed by me and considered in my medical decision making (see chart for details).  Clinical Course    30 y.o. female presents with recurrent cyclic vomiting for which she has been seen numerous times. Had labs drawn 2 days ago which were normal. Likely 2/2 chronic cannabis abuse. Given haldol, not vomiting but continues to spit into a trash can. Given IVF for losses, do not feel extensive workup is warranted for repeat visit this soon. Other supportive care measures with benzos and NSAIDs offered. Plan to follow up with PCP as needed and return precautions discussed for worsening or new concerning symptoms.   Final Clinical Impressions(s) / ED Diagnoses   Final diagnoses:   Non-intractable cyclical vomiting with nausea    New Prescriptions New Prescriptions   No medications on file     Lyndal Pulleyaniel Kauan Kloosterman, MD 09/21/16 [phone]

## 2016-09-20 NOTE — ED Triage Notes (Signed)
Per EMS pt c/o abdominal pain, nausea, emesis x 1 week. [facility], diagnosed with gastroparesis and sent home with suppositories. Reports taking her medication as prescribed without relief. 2 episodes emesis with EMS. Pt took phenergan suppository about 20 minutes ago.

## 2016-09-20 NOTE — Progress Notes (Signed)
EDCM spoke to patient at bedside. Patient confirms she does not have a pcp or insurance living in [place].  EDCM provided patient with contact information to [facility], informed patient of services there and walk in times.  EDCM also provided patient with list of pcps who accept self pay patients, list of discount pharmacies and websites needymeds.org and GoodRX.com for medication assistance, phone number to inquire about the orange card, phone number to inquire about Medicaid, phone number to inquire about the Affordable Care Act, financial resources in the community such as local churches, [employer], [employer] ministries, and dental assistance for uninsured patients.  Patient thankful for resources.  No further EDCM needs at this time. 

## 2016-09-21 ENCOUNTER — Emergency Department (HOSPITAL_COMMUNITY)
Admission: EM | Admit: 2016-09-21 | Discharge: 2016-09-22 | Disposition: A | Discharge status: Home / Self Care | Payer: Self-pay | Attending: Emergency Medicine | Admitting: Emergency Medicine

## 2016-09-21 ENCOUNTER — Encounter (HOSPITAL_COMMUNITY): Payer: Self-pay | Admitting: Emergency Medicine

## 2016-09-21 DIAGNOSIS — R1084 Generalized abdominal pain: Secondary | ICD-10-CM

## 2016-09-21 DIAGNOSIS — Z79899 Other long term (current) drug therapy: Secondary | ICD-10-CM | POA: Insufficient documentation

## 2016-09-21 DIAGNOSIS — R1115 Cyclical vomiting syndrome unrelated to migraine: Secondary | ICD-10-CM

## 2016-09-21 DIAGNOSIS — A5900 Urogenital trichomoniasis, unspecified: Secondary | ICD-10-CM | POA: Insufficient documentation

## 2016-09-21 DIAGNOSIS — F1290 Cannabis use, unspecified, uncomplicated: Secondary | ICD-10-CM | POA: Insufficient documentation

## 2016-09-21 DIAGNOSIS — I10 Essential (primary) hypertension: Secondary | ICD-10-CM | POA: Insufficient documentation

## 2016-09-21 DIAGNOSIS — F17210 Nicotine dependence, cigarettes, uncomplicated: Secondary | ICD-10-CM | POA: Insufficient documentation

## 2016-09-21 DIAGNOSIS — K920 Hematemesis: Secondary | ICD-10-CM | POA: Insufficient documentation

## 2016-09-21 HISTORY — DX: Gastroparesis: K31.84

## 2016-09-21 LAB — COMPREHENSIVE METABOLIC PANEL
ALBUMIN: 5.3 g/dL — AB (ref 3.5–5.0)
ALK PHOS: 71 U/L (ref 38–126)
ALT: 23 U/L (ref 14–54)
ANION GAP: 9 (ref 5–15)
AST: 29 U/L (ref 15–41)
BILIRUBIN TOTAL: 0.9 mg/dL (ref 0.3–1.2)
BUN: 11 mg/dL (ref 6–20)
CALCIUM: 10 mg/dL (ref 8.9–10.3)
CO2: 28 mmol/L (ref 22–32)
CREATININE: 0.95 mg/dL (ref 0.44–1.00)
Chloride: 100 mmol/L — ABNORMAL LOW (ref 101–111)
GFR calc Af Amer: >60 mL/min (ref >60)
GFR calc non Af Amer: >60 mL/min (ref >60)
GLUCOSE: 135 mg/dL — AB (ref 65–99)
Potassium: 3.5 mmol/L (ref 3.5–5.1)
SODIUM: 137 mmol/L (ref 135–145)
TOTAL PROTEIN: 8.9 g/dL — AB (ref 6.5–8.1)

## 2016-09-21 LAB — URINALYSIS, ROUTINE W REFLEX MICROSCOPIC
Glucose, UA: NEGATIVE mg/dL
KETONES UR: NEGATIVE mg/dL
NITRITE: NEGATIVE
PROTEIN: 30 mg/dL — AB
Specific Gravity, Urine: 1.029 (ref 1.005–1.030)
pH: 6.5 (ref 5.0–8.0)

## 2016-09-21 LAB — RAPID URINE DRUG SCREEN, HOSP PERFORMED
Amphetamines: NOT DETECTED
BARBITURATES: NOT DETECTED
BENZODIAZEPINES: POSITIVE — AB
Cocaine: NOT DETECTED
Opiates: NOT DETECTED
Tetrahydrocannabinol: POSITIVE — AB

## 2016-09-21 LAB — CBC
HCT: 42.3 % (ref 36.0–46.0)
HEMOGLOBIN: 14.1 g/dL (ref 12.0–15.0)
MCH: 31.3 pg (ref 26.0–34.0)
MCHC: 33.3 g/dL (ref 30.0–36.0)
MCV: 93.8 fL (ref 78.0–100.0)
PLATELETS: 234 10*3/uL (ref 150–400)
RBC: 4.51 MIL/uL (ref 3.87–5.11)
RDW: 12.9 % (ref 11.5–15.5)
WBC: 12.4 10*3/uL — ABNORMAL HIGH (ref 4.0–10.5)

## 2016-09-21 LAB — URINE MICROSCOPIC-ADD ON

## 2016-09-21 LAB — I-STAT BETA HCG BLOOD, ED (MC, WL, AP ONLY)

## 2016-09-21 LAB — LIPASE, BLOOD: Lipase: 21 U/L (ref 11–51)

## 2016-09-21 MED ORDER — HALOPERIDOL LACTATE 5 MG/ML IJ SOLN
5.0000 mg | Freq: Once | INTRAMUSCULAR | Status: AC
Start: 2016-09-21 — End: 2016-09-22
  Administered 2016-09-22: 5 mg via INTRAVENOUS
  Filled 2016-09-21: qty 1

## 2016-09-21 MED ORDER — PROMETHAZINE HCL 25 MG/ML IJ SOLN
12.5000 mg | Freq: Once | INTRAMUSCULAR | Status: AC
  Administered 2016-09-22: 12.5 mg via INTRAVENOUS
  Filled 2016-09-21: qty 1

## 2016-09-21 MED ORDER — METRONIDAZOLE 500 MG PO TABS
2000.0000 mg | ORAL_TABLET | Freq: Once | ORAL | Status: AC
  Administered 2016-09-22: 2000 mg via ORAL
  Filled 2016-09-21: qty 4

## 2016-09-21 MED ORDER — LORAZEPAM 2 MG/ML IJ SOLN
1.0000 mg | Freq: Once | INTRAMUSCULAR | Status: AC
  Administered 2016-09-21: 1 mg via INTRAVENOUS
  Filled 2016-09-21: qty 1

## 2016-09-21 MED ORDER — FAMOTIDINE IN NACL 20-0.9 MG/50ML-% IV SOLN
20.0000 mg | Freq: Two times a day (BID) | INTRAVENOUS | Status: DC
  Administered 2016-09-21: 20 mg via INTRAVENOUS
  Filled 2016-09-21 (×2): qty 50

## 2016-09-21 MED ORDER — ONDANSETRON HCL 4 MG/2ML IJ SOLN
4.0000 mg | Freq: Once | INTRAMUSCULAR | Status: AC
Start: 2016-09-21 — End: 2016-09-21
  Administered 2016-09-21: 4 mg via INTRAVENOUS
  Filled 2016-09-21: qty 2

## 2016-09-21 MED ORDER — SODIUM CHLORIDE 0.9 % IV BOLUS (SEPSIS)
1000.0000 mL | Freq: Once | INTRAVENOUS | Status: AC
  Administered 2016-09-21: 1000 mL via INTRAVENOUS

## 2016-09-21 NOTE — ED Notes (Signed)
Pt is aware that a urine sample is needed, she will let NT know when she can go.

## 2016-09-21 NOTE — ED Notes (Signed)
Pt ambulated to resroom

## 2016-09-21 NOTE — ED Provider Notes (Signed)
[facility]-EMERGENCY DEPT Provider Note   CSN: [id number] Arrival date & time: 09/21/16  1850  By signing my name below, I, Alyssa Grove, attest that this documentation has been prepared under the direction and in the presence of Corrinne Benegas Camprubi-Soms, PA. Electronically Signed: Alyssa Grove, ED Scribe. 09/21/16. 9:08 PM.  History   Chief Complaint Chief Complaint  Patient presents with  . Emesis  . Abdominal Pain   The history is provided by the patient and medical records. No language interpreter was used.  Abdominal Pain   This is a recurrent problem. The current episode started more than 1 week ago. The problem occurs constantly. The problem has not changed since onset.The pain is associated with an unknown factor. The pain is located in the generalized abdominal region. The quality of the pain is cramping. The pain is at a severity of 9/10. The pain is moderate. Associated symptoms include nausea and vomiting. Pertinent negatives include fever, diarrhea, flatus, hematochezia, melena, constipation, dysuria, hematuria, arthralgias and myalgias. The symptoms are aggravated by vomiting. Nothing relieves the symptoms. Past workup does not include GI consult.  Emesis   This is a recurrent problem. The current episode started more than 1 week ago. The problem occurs 5 to 10 times per day. The problem has not changed since onset.There has been no fever. Associated symptoms include abdominal pain and chills. Pertinent negatives include no arthralgias, no diarrhea, no fever and no myalgias.   HPI Comments: Taylor Bartlett is a 30 y.o. female who presents to the Emergency Department complaining of gradual onset, persistent, episodic vomiting onset 8 days ago with no relieving factors since she has not tried anything, and no known exacerbating factors. She has multiple episodes daily, can't quantify it. States that the emesis today has been dark brownish red, has a bag at bedside with blood tinged and  dark brown emesis. Pt reports associated nausea, hematemesis, generalized abdominal pain, and chills. She describes the abd pain as 9/10 constant cramping generalized abd pain that is nonradiating with no relieving or exacerbating factors. She was seen 2x in the last 3 days for the same complaints. Admits she didn't take the meds that were prescribed to her recently. No use of NSAID's on a regular basis, EtOH use, recent travel, sick contacts, or suspicious food intake. Pt reports last use of marijuana 1 week ago, just prior to onset of symptoms. Pt has never had an endoscopy, does not have a GI doctor. She denies fevers, chills, chest pain, shortness of breath, diarrhea, constipation, obstipation, melena, hematochezia, vaginal bleeding, vaginal discharge, dysuria, hematuria, numbness, tingling, or weakness. Denies other symptoms at this time.   Past Medical History:  Diagnosis Date  . Alcohol abuse   . Anxiety   . Gastroparesis   . Hypertension   . Reflux   . Seasonal allergies     Patient Active Problem List   Diagnosis Date Noted  . Intractable cyclical vomiting with nausea   . Depression   . Hypokalemia   . Polysubstance abuse   . Bipolar affective disorder, currently depressed, mild (HCC)   . Cyclic vomiting syndrome 09/22/2015    History reviewed. No pertinent surgical history.  OB History    No data available       Home Medications    Prior to Admission medications   Medication Sig Start Date End Date Taking? Authorizing Provider  hydrochlorothiazide (HYDRODIURIL) 25 MG tablet Take 25 mg by mouth at bedtime.     Historical Provider, MD  hydrOXYzine (ATARAX/VISTARIL) 50 MG tablet Take 50 mg by mouth 2 (two) times daily.     Historical Provider, MD  ibuprofen (ADVIL,MOTRIN) 600 MG tablet Take 1 tablet (600 mg total) by mouth every 6 (six) hours as needed. Patient not taking: Reported on 09/20/2016 09/04/16   Barrett HenleNicole Elizabeth Nadeau, PA-C  lisinopril (PRINIVIL,ZESTRIL) 10 MG  tablet Take 10 mg by mouth daily.    Historical Provider, MD  metoCLOPramide (REGLAN) 10 MG tablet Take 1 tablet (10 mg total) by mouth every 6 (six) hours as needed for nausea (nausea/headache). 09/18/16   Preston FleetingAnna Schubert, MD  nicotine (NICODERM CQ - DOSED IN MG/24 HOURS) 21 mg/24hr patch Place 1 patch (21 mg total) onto the skin daily. Patient not taking: Reported on 09/20/2016 09/24/15   Fuller Planhristopher W Rice, MD  OLANZapine (ZYPREXA) 10 MG tablet Take 10 mg by mouth at bedtime.     Historical Provider, MD  omeprazole (PRILOSEC) 20 MG capsule Take 1 capsule (20 mg total) by mouth daily. Patient not taking: Reported on 09/20/2016 09/24/15   Fuller Planhristopher W Rice, MD  ondansetron (ZOFRAN ODT) 4 MG disintegrating tablet Take 1 tablet (4 mg total) by mouth every 8 (eight) hours as needed for nausea or vomiting. Patient not taking: Reported on 09/20/2016 02/14/16   Gerhard Munchobert Lockwood, MD  promethazine (PHENERGAN) 25 MG suppository Place 1 suppository (25 mg total) rectally every 6 (six) hours as needed for nausea or vomiting. 09/18/16   Eyvonne MechanicJeffrey Hedges, PA-C    Family History History reviewed. No pertinent family history.  Social History Social History  Substance Use Topics  . Smoking status: Current Every Day Smoker    Packs/day: 0.50    Types: Cigarettes  . Smokeless tobacco: Never Used  . Alcohol use 1.8 oz/week    3 Cans of beer per week     Allergies   Tomato   Review of Systems Review of Systems  Constitutional: Positive for chills. Negative for fever.  Respiratory: Negative for shortness of breath.   Cardiovascular: Negative for chest pain.  Gastrointestinal: Positive for abdominal pain, nausea and vomiting. Negative for blood in stool, constipation, diarrhea, flatus, hematochezia and melena.       + Hematemesis  Genitourinary: Negative for dysuria, hematuria, vaginal bleeding and vaginal discharge.  Musculoskeletal: Negative for arthralgias and myalgias.  Skin: Negative for rash.    Allergic/Immunologic: Negative for immunocompromised state.  Neurological: Negative for weakness and numbness.  Psychiatric/Behavioral: Negative for confusion.   10 Systems reviewed and are negative for acute change except as noted in the HPI.  Physical Exam Updated Vital Signs BP 145/96   Pulse 94   Temp 98.3 F (36.8 C) (Oral)   Resp 18   LMP 08/31/2016 (Approximate)   SpO2 97%   Physical Exam  Constitutional: She is oriented to person, place, and time. Vital signs are normal. She appears well-developed and well-nourished.  Non-toxic appearance. No distress.  Afebrile, nontoxic, NAD although vomiting into the trash can during the exam  HENT:  Head: Normocephalic and atraumatic.  Mouth/Throat: Oropharynx is clear and moist and mucous membranes are normal.  Eyes: Conjunctivae and EOM are normal. Right eye exhibits no discharge. Left eye exhibits no discharge.  Neck: Normal range of motion. Neck supple.  Cardiovascular: Normal rate, regular rhythm, normal heart sounds and intact distal pulses.  Exam reveals no gallop and no friction rub.   No murmur heard. Pulmonary/Chest: Effort normal and breath sounds normal. No respiratory distress. She has no decreased breath sounds. She has  no wheezes. She has no rhonchi. She has no rales.  Abdominal: Soft. Normal appearance and bowel sounds are normal. She exhibits no distension. There is generalized tenderness. There is no rigidity, no rebound, no guarding, no CVA tenderness, no tenderness at McBurney's point and negative Murphy's sign.  Soft, Nondistended, +BS throughout, with mild generalized TTP throughout, no r/g/r, neg murphy's, neg mcburney's, no CVA TTP  Musculoskeletal: Normal range of motion.  Neurological: She is alert and oriented to person, place, and time. She has normal strength. No sensory deficit.  Skin: Skin is warm, dry and intact. No rash noted.  Psychiatric: She has a normal mood and affect.  Nursing note and vitals  reviewed.  ED Treatments / Results  DIAGNOSTIC STUDIES: Oxygen Saturation is 98% on RA, normal by my interpretation.    COORDINATION OF CARE: 9:06 PM Discussed treatment plan with pt at bedside which includes Urinalysis and pt agreed to plan.  Labs (all labs ordered are listed, but only abnormal results are displayed) Labs Reviewed  COMPREHENSIVE METABOLIC PANEL - Abnormal; Notable for the following:       Result Value   Chloride 100 (*)    Glucose, Bld 135 (*)    Total Protein 8.9 (*)    Albumin 5.3 (*)    All other components within normal limits  CBC - Abnormal; Notable for the following:    WBC 12.4 (*)    All other components within normal limits  URINALYSIS, ROUTINE W REFLEX MICROSCOPIC (NOT AT [facility]) - Abnormal; Notable for the following:    Color, Urine AMBER (*)    APPearance CLOUDY (*)    Hgb urine dipstick LARGE (*)    Bilirubin Urine SMALL (*)    Protein, ur 30 (*)    Leukocytes, UA SMALL (*)    All other components within normal limits  RAPID URINE DRUG SCREEN, HOSP PERFORMED - Abnormal; Notable for the following:    Benzodiazepines POSITIVE (*)    Tetrahydrocannabinol POSITIVE (*)    All other components within normal limits  URINE MICROSCOPIC-ADD ON - Abnormal; Notable for the following:    Squamous Epithelial / LPF 6-30 (*)    Bacteria, UA MANY (*)    All other components within normal limits  LIPASE, BLOOD  I-STAT BETA HCG BLOOD, ED ([facility], [facility], [facility] ONLY)    EKG  EKG Interpretation None       Radiology No results found.  Procedures Procedures (including critical care time)  Medications Ordered in ED Medications  famotidine (PEPCID) IVPB 20 mg premix (0 mg Intravenous Stopped 09/21/16 2155)  sodium chloride 0.9 % bolus 1,000 mL (0 mLs Intravenous Stopped 09/21/16 2251)  ondansetron (ZOFRAN) injection 4 mg (4 mg Intravenous Given 09/21/16 2124)  LORazepam (ATIVAN) injection 1 mg (1 mg Intravenous Given 09/21/16 2125)  haloperidol lactate (HALDOL)  injection 5 mg (5 mg Intravenous Given 09/22/16 0018)  promethazine (PHENERGAN) injection 12.5 mg (12.5 mg Intravenous Given 09/22/16 0017)  metroNIDAZOLE (FLAGYL) tablet 2,000 mg (2,000 mg Oral Given 09/22/16 0019)     Initial Impression / Assessment and Plan / ED Course  I have reviewed the triage vital signs and the nursing notes.  Pertinent labs & imaging results that were available during my care of the patient were reviewed by me and considered in my medical decision making (see chart for details).  Clinical Course   30 y.o. female here with n/v/abd pain, 3rd visit this week; emesis has become more bloody, has a bag at bedside with  dark reddish-tinged brown emesis. On exam, generalized abd TTP throughout, mildly dry mucous membranes, nonperitoneal and no focal tenderness on exam. HCG neg. Lipase WNL. CMP essentially unremarkable. CBC with mildly elevated WBC 12.4 but overall less than prior values, H/H maintained but could be hemoconcentrated. Awaiting U/A. Will add-on UDS. Will give pepcid, fluids, zofran, and ativan, then reassess shortly. Doubt need for imaging or pelvic exam at this time.   11:44 PM UDS with +benzos and +THC. U/A showing +trich which seems to have been present since March and doesn't seem that she's ever been treated-- otherwise U/A not convincing for UTI, very contaminated so doubt UCx would help. Pt still vomiting but no longer having hematemesis. Given that her H/H didn't drop from 2 days ago, and no ongoing hematemesis, doubt need for admission at this point as long as we can get her symptoms controlled. Will also need to treat trich. Will give phenergan and haldol, if able to keep PO down then will give flagyl and then likely d/c. If intractable symptoms then will need to admit.   2:19 AM Vitals stabilized since fluids finished. Pt tolerating PO well, n/v resolved, no ongoing hematemesis or vomiting. Symptoms resolved at this time. Will rx phenergan and zantac.  Discussed marijuana cessation. Pt has no insurance and no PCP, discussed that eventually she needs a GI specialist, but first f/up with [facility] to establish care and then get referred from there, in order to see if financial counselors can get her assistance. Discussed having partners tested/treated for trich before re-engaging in sexual activities. Diet modifications discussed. Tylenol for pain. I explained the diagnosis and have given explicit precautions to return to the ER including for any other new or worsening symptoms. The patient understands and accepts the medical plan as it's been dictated and I have answered their questions. Discharge instructions concerning home care and prescriptions have been given. The patient is STABLE and is discharged to home in good condition.   Final Clinical Impressions(s) / ED Diagnoses   Final diagnoses:  Non-intractable cyclical vomiting with nausea  Trichomoniasis, urogenital  Marijuana use  Hematemesis with nausea  Generalized abdominal pain    New Prescriptions New Prescriptions   PROMETHAZINE (PHENERGAN) 25 MG TABLET    Take 1 tablet (25 mg total) by mouth every 6 (six) hours as needed for nausea or vomiting.   RANITIDINE (ZANTAC) 150 MG TABLET    Take 1 tablet (150 mg total) by mouth 2 (two) times daily.     Ellisha Bankson Camprubi-Soms, PA-C 09/22/16 [id number]    Lyndal Pulleyaniel Knott, MD 09/22/16 [phone]

## 2016-09-21 NOTE — ED Triage Notes (Signed)
Per EMS. Pt reports n/v and abd pain for the past 3 days. Has been in the ED 2x for same in the past few days and was diagnosed with gastroparesis. Told EMS she has not taken her medications.

## 2016-09-22 MED ORDER — PROMETHAZINE HCL 25 MG PO TABS: 25.0000 mg | ORAL_TABLET | Freq: Four times a day (QID) | ORAL | 0 refills | Status: DC | PRN

## 2016-09-22 MED ORDER — RANITIDINE HCL 150 MG PO TABS: 150.0000 mg | ORAL_TABLET | Freq: Two times a day (BID) | ORAL | 0 refills | Status: DC

## 2016-09-22 NOTE — Discharge Instructions (Signed)
You likely have cyclic vomiting syndrome or potentially an ulcer/gastritis. You will need to take zantac as directed, and avoid spicy/fatty/acidic foods, avoid soda/coffee/tea/alcohol. Avoid laying down flat within 30 minutes of eating. Avoid NSAIDs like ibuprofen/aleve/motrin/etc on an empty stomach. May consider using over the counter tums/maalox as needed for additional relief. Use phenergan as directed as needed for nausea. Use tylenol as needed for pain. STOP SMOKING MARIJUANA. You were treated for trichomonas today, have your partners tested and treated before re-engaging in sexual activities. [facility] in one week to establish care and for ongoing evaluation of your abdominal pain and recurrent nausea/vomiting. Return to the ER for changes or worsening symptoms.  Abdominal (belly) pain can be caused by many things. Your caregiver performed an examination and possibly ordered blood/urine tests and imaging (CT scan, x-rays, ultrasound). Many cases can be observed and treated at home after initial evaluation in the emergency department. Even though you are being discharged home, abdominal pain can be unpredictable. Therefore, you need a repeated exam if your pain does not resolve, returns, or worsens. Most patients with abdominal pain don't have to be admitted to the hospital or have surgery, but serious problems like appendicitis and gallbladder attacks can start out as nonspecific pain. Many abdominal conditions cannot be diagnosed in one visit, so follow-up evaluations are very important. SEEK IMMEDIATE MEDICAL ATTENTION IF YOU DEVELOP ANY OF THE FOLLOWING SYMPTOMS: The pain does not go away or becomes severe.  A temperature above 101 develops.  Repeated vomiting occurs (multiple episodes).  The pain becomes localized to portions of the abdomen. The right side could possibly be appendicitis. In an adult, the left lower portion of the abdomen could be colitis or  diverticulitis.  Blood is being passed in stools or vomit (bright red or black tarry stools).  Return also if you develop chest pain, difficulty breathing, dizziness or fainting, or become confused, poorly responsive, or inconsolable (young children). The constipation stays for more than 4 days.  There is belly (abdominal) or rectal pain.  You do not seem to be getting better.

## 2016-09-23 ENCOUNTER — Encounter (HOSPITAL_COMMUNITY): Payer: Self-pay

## 2016-09-23 ENCOUNTER — Emergency Department (HOSPITAL_COMMUNITY)
Admission: EM | Admit: 2016-09-23 | Discharge: 2016-09-23 | Disposition: A | Discharge status: Home / Self Care | Payer: Self-pay | Attending: Emergency Medicine | Admitting: Emergency Medicine

## 2016-09-23 DIAGNOSIS — R197 Diarrhea, unspecified: Secondary | ICD-10-CM | POA: Insufficient documentation

## 2016-09-23 DIAGNOSIS — F17210 Nicotine dependence, cigarettes, uncomplicated: Secondary | ICD-10-CM | POA: Insufficient documentation

## 2016-09-23 DIAGNOSIS — R1110 Vomiting, unspecified: Secondary | ICD-10-CM

## 2016-09-23 DIAGNOSIS — F1220 Cannabis dependence, uncomplicated: Secondary | ICD-10-CM

## 2016-09-23 DIAGNOSIS — R109 Unspecified abdominal pain: Secondary | ICD-10-CM

## 2016-09-23 DIAGNOSIS — R112 Nausea with vomiting, unspecified: Secondary | ICD-10-CM

## 2016-09-23 DIAGNOSIS — Z79899 Other long term (current) drug therapy: Secondary | ICD-10-CM | POA: Insufficient documentation

## 2016-09-23 DIAGNOSIS — E876 Hypokalemia: Secondary | ICD-10-CM | POA: Insufficient documentation

## 2016-09-23 DIAGNOSIS — R319 Hematuria, unspecified: Secondary | ICD-10-CM

## 2016-09-23 DIAGNOSIS — I10 Essential (primary) hypertension: Secondary | ICD-10-CM | POA: Insufficient documentation

## 2016-09-23 DIAGNOSIS — N390 Urinary tract infection, site not specified: Secondary | ICD-10-CM | POA: Insufficient documentation

## 2016-09-23 DIAGNOSIS — F1290 Cannabis use, unspecified, uncomplicated: Secondary | ICD-10-CM | POA: Insufficient documentation

## 2016-09-23 LAB — COMPREHENSIVE METABOLIC PANEL
ALT: 17 U/L (ref 14–54)
ANION GAP: 8 (ref 5–15)
AST: 21 U/L (ref 15–41)
Albumin: 4.5 g/dL (ref 3.5–5.0)
Alkaline Phosphatase: 58 U/L (ref 38–126)
BUN: 10 mg/dL (ref 6–20)
CHLORIDE: 103 mmol/L (ref 101–111)
CO2: 26 mmol/L (ref 22–32)
Calcium: 9.3 mg/dL (ref 8.9–10.3)
Creatinine, Ser: 0.78 mg/dL (ref 0.44–1.00)
GFR calc non Af Amer: >60 mL/min (ref >60)
Glucose, Bld: 107 mg/dL — ABNORMAL HIGH (ref 65–99)
Potassium: 2.9 mmol/L — ABNORMAL LOW (ref 3.5–5.1)
SODIUM: 137 mmol/L (ref 135–145)
Total Bilirubin: 0.8 mg/dL (ref 0.3–1.2)
Total Protein: 7.6 g/dL (ref 6.5–8.1)

## 2016-09-23 LAB — CBC
HCT: 35.4 % — ABNORMAL LOW (ref 36.0–46.0)
HEMOGLOBIN: 11.9 g/dL — AB (ref 12.0–15.0)
MCH: 31.4 pg (ref 26.0–34.0)
MCHC: 33.6 g/dL (ref 30.0–36.0)
MCV: 93.4 fL (ref 78.0–100.0)
Platelets: 213 10*3/uL (ref 150–400)
RBC: 3.79 MIL/uL — AB (ref 3.87–5.11)
RDW: 12.9 % (ref 11.5–15.5)
WBC: 17.3 10*3/uL — ABNORMAL HIGH (ref 4.0–10.5)

## 2016-09-23 LAB — URINALYSIS, ROUTINE W REFLEX MICROSCOPIC
GLUCOSE, UA: NEGATIVE mg/dL
Ketones, ur: 15 mg/dL — AB
Nitrite: POSITIVE — AB
Protein, ur: 30 mg/dL — AB
SPECIFIC GRAVITY, URINE: 1.026 (ref 1.005–1.030)
pH: 6.5 (ref 5.0–8.0)

## 2016-09-23 LAB — URINE MICROSCOPIC-ADD ON

## 2016-09-23 LAB — LIPASE, BLOOD: LIPASE: 19 U/L (ref 11–51)

## 2016-09-23 LAB — MAGNESIUM: Magnesium: 1.8 mg/dL (ref 1.7–2.4)

## 2016-09-23 MED ORDER — CIPROFLOXACIN HCL 500 MG PO TABS: 500.0000 mg | ORAL_TABLET | Freq: Two times a day (BID) | ORAL | 0 refills | Status: DC

## 2016-09-23 MED ORDER — KETOROLAC TROMETHAMINE 30 MG/ML IJ SOLN
30.0000 mg | Freq: Once | INTRAMUSCULAR | Status: AC
  Administered 2016-09-23: 30 mg via INTRAVENOUS
  Filled 2016-09-23: qty 1

## 2016-09-23 MED ORDER — HALOPERIDOL LACTATE 5 MG/ML IJ SOLN
2.0000 mg | Freq: Once | INTRAMUSCULAR | Status: AC
  Administered 2016-09-23: 2 mg via INTRAVENOUS
  Filled 2016-09-23: qty 1

## 2016-09-23 MED ORDER — ONDANSETRON HCL 4 MG/2ML IJ SOLN
4.0000 mg | Freq: Once | INTRAMUSCULAR | Status: AC
  Administered 2016-09-23: 4 mg via INTRAVENOUS
  Filled 2016-09-23: qty 2

## 2016-09-23 MED ORDER — ONDANSETRON 4 MG PO TBDP
4.0000 mg | ORAL_TABLET | Freq: Once | ORAL | Status: AC | PRN
  Administered 2016-09-23: 4 mg via ORAL
  Filled 2016-09-23: qty 1

## 2016-09-23 MED ORDER — METOCLOPRAMIDE HCL 5 MG/ML IJ SOLN
10.0000 mg | Freq: Once | INTRAMUSCULAR | Status: AC
  Administered 2016-09-23: 10 mg via INTRAVENOUS
  Filled 2016-09-23: qty 2

## 2016-09-23 MED ORDER — POTASSIUM CHLORIDE CRYS ER 20 MEQ PO TBCR
40.0000 meq | EXTENDED_RELEASE_TABLET | Freq: Once | ORAL | Status: AC
  Administered 2016-09-23: 40 meq via ORAL
  Filled 2016-09-23: qty 2

## 2016-09-23 MED ORDER — DEXTROSE 5 % IV SOLN
1.0000 g | INTRAVENOUS | Status: AC
  Administered 2016-09-23: 1 g via INTRAVENOUS
  Filled 2016-09-23: qty 10

## 2016-09-23 MED ORDER — SODIUM CHLORIDE 0.9 % IV BOLUS (SEPSIS)
1000.0000 mL | Freq: Once | INTRAVENOUS | Status: AC
  Administered 2016-09-23: 1000 mL via INTRAVENOUS

## 2016-09-23 MED ORDER — POTASSIUM CHLORIDE 10 MEQ/100ML IV SOLN
10.0000 meq | Freq: Once | INTRAVENOUS | Status: AC
  Administered 2016-09-23: 10 meq via INTRAVENOUS
  Filled 2016-09-23: qty 100

## 2016-09-23 MED ORDER — MAGNESIUM OXIDE 400 (241.3 MG) MG PO TABS
400.0000 mg | ORAL_TABLET | ORAL | Status: AC
  Administered 2016-09-23: 400 mg via ORAL
  Filled 2016-09-23: qty 1

## 2016-09-23 NOTE — Progress Notes (Signed)
[facility] and this is the first admission in last 6 months  [facility]16 and given resources for finding uninsured pcp  NO ED CP and no [facility] availability noted

## 2016-09-23 NOTE — ED Notes (Signed)
ED Provider at bedside. Pt allowed to have Ice Chips.   Ice Chips provided.

## 2016-09-23 NOTE — ED Notes (Signed)
Pt reports having abd pain with nausea, vomiting, and diarrhea for the last 2 days. Pt states that she was seen in ED for same complaints and discharged with prescriptions. Pt states that prescriptions did not help any symptoms that were occurring.

## 2016-09-23 NOTE — ED Triage Notes (Signed)
Pt complains of abdominal pain and vomiting all week, has been seen multiple times for the same

## 2016-09-23 NOTE — ED Notes (Signed)
Pt. Made aware for the need of urine. 

## 2016-09-23 NOTE — ED Provider Notes (Signed)
[facility]-EMERGENCY DEPT Provider Note   CSN: [id number] Arrival date & time: 09/23/16  0353     History   Chief Complaint Chief Complaint  Patient presents with  . Abdominal Pain    HPI Joesph Julyanque S Bartlett is a 30 y.o. female.  HPI Patient presents with nausea vomiting abdominal pain. States she's had for the last week. States she's had a little bit of diarrhea. She states she took no medicines for it home. States she's had this pain for a while and was told it was from smoking marijuana. States she has not smoked in the last week. Pain is dull and constant. Is crampy. Denies dysuria. No relief with the ODT Zofran given in triage.   Past Medical History:  Diagnosis Date  . Alcohol abuse   . Anxiety   . Gastroparesis   . Hypertension   . Reflux   . Seasonal allergies     Patient Active Problem List   Diagnosis Date Noted  . Intractable cyclical vomiting with nausea   . Depression   . Hypokalemia   . Polysubstance abuse   . Bipolar affective disorder, currently depressed, mild (HCC)   . Cyclic vomiting syndrome 09/22/2015    History reviewed. No pertinent surgical history.  OB History    No data available       Home Medications    Prior to Admission medications   Medication Sig Start Date End Date Taking? Authorizing Provider  hydrochlorothiazide (HYDRODIURIL) 25 MG tablet Take 25 mg by mouth at bedtime.    Yes Historical Provider, MD  hydrOXYzine (ATARAX/VISTARIL) 50 MG tablet Take 50 mg by mouth 2 (two) times daily.    Yes Historical Provider, MD  lisinopril (PRINIVIL,ZESTRIL) 10 MG tablet Take 10 mg by mouth daily.   Yes Historical Provider, MD  metoCLOPramide (REGLAN) 10 MG tablet Take 1 tablet (10 mg total) by mouth every 6 (six) hours as needed for nausea (nausea/headache). 09/18/16  Yes Preston FleetingAnna Schubert, MD  OLANZapine (ZYPREXA) 10 MG tablet Take 10 mg by mouth at bedtime.    Yes Historical Provider, MD  promethazine (PHENERGAN) 25 MG tablet Take 1 tablet (25  mg total) by mouth every 6 (six) hours as needed for nausea or vomiting. 09/22/16  Yes Mercedes Camprubi-Soms, PA-C  ranitidine (ZANTAC) 150 MG tablet Take 1 tablet (150 mg total) by mouth 2 (two) times daily. 09/22/16  Yes Mercedes Camprubi-Soms, PA-C  ibuprofen (ADVIL,MOTRIN) 600 MG tablet Take 1 tablet (600 mg total) by mouth every 6 (six) hours as needed. Patient not taking: Reported on 09/23/2016 09/04/16   Barrett HenleNicole Elizabeth Nadeau, PA-C  nicotine (NICODERM CQ - DOSED IN MG/24 HOURS) 21 mg/24hr patch Place 1 patch (21 mg total) onto the skin daily. Patient not taking: Reported on 09/23/2016 09/24/15   Fuller Planhristopher W Rice, MD  omeprazole (PRILOSEC) 20 MG capsule Take 1 capsule (20 mg total) by mouth daily. Patient not taking: Reported on 09/23/2016 09/24/15   Fuller Planhristopher W Rice, MD  ondansetron (ZOFRAN ODT) 4 MG disintegrating tablet Take 1 tablet (4 mg total) by mouth every 8 (eight) hours as needed for nausea or vomiting. Patient not taking: Reported on 09/23/2016 02/14/16   Gerhard Munchobert Lockwood, MD  promethazine (PHENERGAN) 25 MG suppository Place 1 suppository (25 mg total) rectally every 6 (six) hours as needed for nausea or vomiting. Patient not taking: Reported on 09/23/2016 09/18/16   Eyvonne MechanicJeffrey Hedges, PA-C    Family History History reviewed. No pertinent family history.  Social History Social History  Substance  Use Topics  . Smoking status: Current Every Day Smoker    Packs/day: 0.50    Types: Cigarettes  . Smokeless tobacco: Never Used  . Alcohol use 1.8 oz/week    3 Cans of beer per week     Allergies   Tomato   Review of Systems Review of Systems  Constitutional: Positive for appetite change.  HENT: Negative for congestion.   Respiratory: Negative for shortness of breath.   Cardiovascular: Negative for chest pain.  Gastrointestinal: Positive for abdominal pain, diarrhea, nausea and vomiting.  Genitourinary: Negative for dysuria.  Musculoskeletal: Negative for back pain.  Skin:  Negative for wound.  Neurological: Negative for weakness.  Psychiatric/Behavioral: Negative for confusion.     Physical Exam Updated Vital Signs BP (!) 173/108 (BP Location: Right Arm)   Pulse 90   Temp 98.1 F (36.7 C) (Oral)   Resp 15   Ht 5\' 2"  (1.575 m)   Wt 130 lb (59 kg)   LMP 08/31/2016 (Approximate)   SpO2 100%   BMI 23.78 kg/m   Physical Exam  Constitutional: She appears well-developed.  HENT:  Head: Atraumatic.  Eyes: Pupils are equal, round, and reactive to light.  Neck: Neck supple.  Cardiovascular: Normal rate.   Abdominal: There is tenderness.  Upper abdominal tenderness without rebound or guarding.  Musculoskeletal: She exhibits no edema.  Neurological: She is alert.  Skin: Skin is warm. Capillary refill takes less than 2 seconds.  Psychiatric: She has a normal mood and affect.     ED Treatments / Results  Labs (all labs ordered are listed, but only abnormal results are displayed) Labs Reviewed  COMPREHENSIVE METABOLIC PANEL - Abnormal; Notable for the following:       Result Value   Potassium 2.9 (*)    Glucose, Bld 107 (*)    All other components within normal limits  CBC - Abnormal; Notable for the following:    WBC 17.3 (*)    RBC 3.79 (*)    Hemoglobin 11.9 (*)    HCT 35.4 (*)    All other components within normal limits  LIPASE, BLOOD  URINALYSIS, ROUTINE W REFLEX MICROSCOPIC (NOT [facility])    EKG  EKG Interpretation None       Radiology No results found.  Procedures Procedures (including critical care time)  Medications Ordered in ED Medications  ondansetron (ZOFRAN-ODT) disintegrating tablet 4 mg (4 mg Oral Given 09/23/16 0535)  potassium chloride 10 mEq in 100 mL IVPB (0 mEq Intravenous Stopped 09/23/16 0913)  sodium chloride 0.9 % bolus 1,000 mL (0 mLs Intravenous Stopped 09/23/16 0945)  haloperidol lactate (HALDOL) injection 2 mg (2 mg Intravenous Given 09/23/16 0810)  potassium chloride SA (K-DUR,KLOR-CON) CR tablet 40  mEq (40 mEq Oral Given 09/23/16 0944)  metoCLOPramide (REGLAN) injection 10 mg (10 mg Intravenous Given 09/23/16 1018)     Initial Impression / Assessment and Plan / ED Course  I have reviewed the triage vital signs and the nursing notes.  Pertinent labs & imaging results that were available during my care of the patient were reviewed by me and considered in my medical decision making (see chart for details).  Clinical Course    Patient with nausea and vomiting. History of same. Had question of adenoid hyperemesis versus gastroparesis in the past. Now has hypokalemia. Has some diarrhea 2. Continues to vomit in the ER. Will admit to internal medicine.  Final Clinical Impressions(s) / ED Diagnoses   Final diagnoses:  Intractable vomiting with nausea,  unspecified vomiting type  Hypokalemia    New Prescriptions New Prescriptions   No medications on file     Benjiman Core, MD 09/23/16 1137

## 2016-09-23 NOTE — Consult Note (Signed)
Medical Consultation   Taylor Bartlett  WGN:[id number]:[id number]  DOB: 03/03/1986  DOA: 09/23/2016  PCP: No PCP Per Patient    Requesting physician: Carmell AustriaNate Pickering  Reason for consultation: Recurrent vomiting   History of Present Illness: Taylor Bartlett is an 30 y.o. female past medical history of bipolar disorder and frequent visits to the emergency room for intractable vomiting which has been felt to be secondary to her recurrent THC use. Patient was most recently in the emergency room 3 days prior for the same issue. She came in today complaining of the same. She states that she has been throwing up now for several days. States that she cannot keep anything down. In the emergency room, patient's blood work was noted to be elevated at 17 although she had no fever. Her potassium was slightly low at 2.9. Patient given 1 IV run and 40 mEq. Patient was given a dose of IV Haldol, Zofran and hospitals for call for further evaluation  After evaluation, I ordered some Toradol for her abdominal cramping. Magnesium level was checked and found to be in the low end of normal. Patient given mag oxide as well as a second dose of 40 mEq of by mouth potassium. Patient along the vomiting although still having some spit up, however this is similar to her other presentations when she was sent home. It was noted that her urine was concentrated so urinalysis was sent and patient was found to have a mild UTI. A dose of IV Rocephin as ordered and patient was given a prescription for by mouth Cipro 3 days.    Review of Systems:  ROS Patient complains of continued nausea as well as some abdominal cramping.  She denies any headaches, vision changes, dysphagia, chest pain, palpitations, shortness of breath, wheeze, cough, hematuria or dysuria, constipation or diarrhea, focal extremity numbness weakness or pain. Review of systems is otherwise negative   Past Medical History: Past Medical History:    Diagnosis Date  . Alcohol abuse   . Anxiety   . Gastroparesis   . Hypertension   . Reflux   . Seasonal allergies     Past Surgical History: History reviewed. No pertinent surgical history.   Allergies:   Allergies  Allergen Reactions  . Tomato Anaphylaxis and Itching     Social History:  reports that she has been smoking Cigarettes.  She has been smoking about 0.50 packs per day. She has never used smokeless tobacco. She reports that she drinks about 1.8 oz of alcohol per week . She reports that she uses drugs, including Marijuana. Patient lives at home with family, she is able to ambulate without use of a walker or cane  Family History: Patient states high blood pressure runs in her family   Physical Exam: Vitals:   09/23/16 0657 09/23/16 0944 09/23/16 1307 09/23/16 1508  BP:  (!) 173/108 146/95 (!) 160/103  Pulse:  90 83 72  Resp:  15 17 15   Temp:    98.2 F (36.8 C)  TempSrc:    Oral  SpO2:  100% 99% 99%  Weight: 59 kg (130 lb)     Height: 5\' 2"  (1.575 m)       Constitutional: Alert and oriented 3, no acute distress Eyes: Sclera nonicteric, extraocular movements are intact  ENMT: Normocephalic, atraumatic, mucous members are slightly dry  Neck: Supple, no JVD CVS: Regular rate and rhythm, S1-S2  Respiratory:  clear to auscultation bilaterally, no wheezing, or rhonchi  Abdomen: soft nontender, nondistended, normal bowel sounds,  Musculoskeletal: : No clubbing or cyanosis or edema laterally Neuro: No focal deficits Psych: Slightly flattened affect, patient otherwise appropriate, no evidence of psychoses Skin: No skin breaks, tears or lesions    Data reviewed:  I have personally reviewed following labs and imaging studies Labs:  CBC:  Recent Labs Lab 09/18/16 1431 09/21/16 1948 09/23/16 0539  WBC 16.5* 12.4* 17.3*  HGB 14.6 14.1 11.9*  HCT 43.1 42.3 35.4*  MCV 92.9 93.8 93.4  PLT 233 234 213    Basic Metabolic Panel:  Recent Labs Lab  09/18/16 1431 09/21/16 1948 09/23/16 0539 09/23/16 0545  NA 134* 137 137  --   K 4.3 3.5 2.9*  --   CL 97* 100* 103  --   CO2 23 28 26   --   GLUCOSE 151* 135* 107*  --   BUN 13 11 10   --   CREATININE 1.07* 0.95 0.78  --   CALCIUM 10.7* 10.0 9.3  --   MG  --   --   --  1.8   GFR Estimated Creatinine Clearance: 81.3 mL/min (by C-G formula based on SCr of 0.78 mg/dL). Liver Function Tests:  Recent Labs Lab 09/18/16 1431 09/21/16 1948 09/23/16 0539  AST 30 29 21   ALT 18 23 17   ALKPHOS 70 71 58  BILITOT 0.6 0.9 0.8  PROT 8.9* 8.9* 7.6  ALBUMIN 5.0 5.3* 4.5    Recent Labs Lab 09/18/16 1431 09/21/16 1948 09/23/16 0539  LIPASE 23 21 19    No results for input(s): AMMONIA in the last 168 hours. Coagulation profile No results for input(s): INR, PROTIME in the last 168 hours.  Cardiac Enzymes: No results for input(s): CKTOTAL, CKMB, CKMBINDEX, TROPONINI in the last 168 hours. BNP: Invalid input(s): POCBNP CBG: No results for input(s): GLUCAP in the last 168 hours. D-Dimer No results for input(s): DDIMER in the last 72 hours. Hgb A1c No results for input(s): HGBA1C in the last 72 hours. Lipid Profile No results for input(s): CHOL, HDL, LDLCALC, TRIG, CHOLHDL, LDLDIRECT in the last 72 hours. Thyroid function studies No results for input(s): TSH, T4TOTAL, T3FREE, THYROIDAB in the last 72 hours.  Invalid input(s): FREET3 Anemia work up No results for input(s): VITAMINB12, FOLATE, FERRITIN, TIBC, IRON, RETICCTPCT in the last 72 hours. Urinalysis    Component Value Date/Time   COLORURINE RED (A) 09/23/2016 1347   APPEARANCEUR TURBID (A) 09/23/2016 1347   LABSPEC 1.026 09/23/2016 1347   PHURINE 6.5 09/23/2016 1347   GLUCOSEU NEGATIVE 09/23/2016 1347   HGBUR LARGE (A) 09/23/2016 1347   BILIRUBINUR SMALL (A) 09/23/2016 1347   KETONESUR 15 (A) 09/23/2016 1347   PROTEINUR 30 (A) 09/23/2016 1347   UROBILINOGEN 0.2 09/22/2015 1729   NITRITE POSITIVE (A) 09/23/2016  1347   LEUKOCYTESUR SMALL (A) 09/23/2016 1347     Microbiology No results found for this or any previous visit (from the past 240 hour(s)).     Inpatient Medications:   Scheduled Meds:  Continuous Infusions: . cefTRIAXone (ROCEPHIN)  IV 1 g (09/23/16 1622)     Radiological Exams on Admission: No results found.  Impression/Recommendations Intractable vomiting: Stabilized. This is secondary to [facility] use which I have continued to impress upon the patient that she needs to stop. At this time, given the left joint replacement, no indication to admit patient.  UTI: Mild. Do not think that this is the cause of her  nausea vomiting. Given IV Rocephin in the emergency room and then 3 days by mouth Cipro.  Hypokalemia: Secondary to vomiting. Replaced. Stable. Magnesium level normal.  Bipolar disorder: Unclear patient has been taking her medications at home. She received a dose of IV Haldol.  Patient stable for discharge home, however she will continue to return to the emergency room. Need case management intervention to see if a consult with alternatives to get her help she needs.   Hollice EspyKRISHNAN,SENDIL K M.D. [facility] 09/23/2016, 4:43 PM

## 2016-09-23 NOTE — ED Notes (Signed)
Discharge pending on IV ATB completion

## 2016-11-23 ENCOUNTER — Inpatient Hospital Stay (HOSPITAL_COMMUNITY)
Admission: EM | Admit: 2016-11-23 | Discharge: 2016-11-25 | DRG: 683 | Disposition: A | Discharge status: Home / Self Care | Payer: Self-pay | Attending: Family Medicine | Admitting: Family Medicine

## 2016-11-23 ENCOUNTER — Encounter (HOSPITAL_COMMUNITY): Payer: Self-pay

## 2016-11-23 ENCOUNTER — Emergency Department (HOSPITAL_COMMUNITY): Payer: Self-pay

## 2016-11-23 DIAGNOSIS — K219 Gastro-esophageal reflux disease without esophagitis: Secondary | ICD-10-CM | POA: Diagnosis present

## 2016-11-23 DIAGNOSIS — E872 Acidosis, unspecified: Secondary | ICD-10-CM

## 2016-11-23 DIAGNOSIS — R112 Nausea with vomiting, unspecified: Secondary | ICD-10-CM

## 2016-11-23 DIAGNOSIS — F1010 Alcohol abuse, uncomplicated: Secondary | ICD-10-CM | POA: Diagnosis present

## 2016-11-23 DIAGNOSIS — I10 Essential (primary) hypertension: Secondary | ICD-10-CM | POA: Diagnosis present

## 2016-11-23 DIAGNOSIS — F319 Bipolar disorder, unspecified: Secondary | ICD-10-CM | POA: Diagnosis present

## 2016-11-23 DIAGNOSIS — F17210 Nicotine dependence, cigarettes, uncomplicated: Secondary | ICD-10-CM | POA: Diagnosis present

## 2016-11-23 DIAGNOSIS — K921 Melena: Secondary | ICD-10-CM | POA: Diagnosis present

## 2016-11-23 DIAGNOSIS — N179 Acute kidney failure, unspecified: Principal | ICD-10-CM | POA: Diagnosis present

## 2016-11-23 DIAGNOSIS — E860 Dehydration: Secondary | ICD-10-CM | POA: Diagnosis present

## 2016-11-23 HISTORY — DX: Accidental discharge from unspecified firearms or gun, initial encounter: W34.00XA

## 2016-11-23 HISTORY — DX: Gastro-esophageal reflux disease without esophagitis: K21.9

## 2016-11-23 LAB — CBC
HCT: 34.6 % — ABNORMAL LOW (ref 36.0–46.0)
HEMATOCRIT: 38.2 % (ref 36.0–46.0)
HEMOGLOBIN: 12.9 g/dL (ref 12.0–15.0)
HEMOGLOBIN: 11.7 g/dL — AB (ref 12.0–15.0)
MCH: 31.9 pg (ref 26.0–34.0)
MCH: 32.2 pg (ref 26.0–34.0)
MCHC: 33.8 g/dL (ref 30.0–36.0)
MCHC: 33.8 g/dL (ref 30.0–36.0)
MCV: 94.3 fL (ref 78.0–100.0)
MCV: 95.3 fL (ref 78.0–100.0)
PLATELETS: 213 10*3/uL (ref 150–400)
Platelets: 236 10*3/uL (ref 150–400)
RBC: 4.05 MIL/uL (ref 3.87–5.11)
RBC: 3.63 MIL/uL — AB (ref 3.87–5.11)
RDW: 13.5 % (ref 11.5–15.5)
RDW: 13.9 % (ref 11.5–15.5)
WBC: 17.4 10*3/uL — ABNORMAL HIGH (ref 4.0–10.5)
WBC: 17.6 10*3/uL — ABNORMAL HIGH (ref 4.0–10.5)

## 2016-11-23 LAB — URINALYSIS, ROUTINE W REFLEX MICROSCOPIC
BACTERIA UA: NONE SEEN
Bilirubin Urine: NEGATIVE
Glucose, UA: NEGATIVE mg/dL
Ketones, ur: NEGATIVE mg/dL
Leukocytes, UA: NEGATIVE
Nitrite: NEGATIVE
PROTEIN: 30 mg/dL — AB
Specific Gravity, Urine: 1.023 (ref 1.005–1.030)
pH: 5 (ref 5.0–8.0)

## 2016-11-23 LAB — I-STAT CG4 LACTIC ACID, ED
LACTIC ACID, VENOUS: 3.79 mmol/L — AB (ref 0.5–1.9)
Lactic Acid, Venous: 4.89 mmol/L (ref 0.5–1.9)
Lactic Acid, Venous: 2.66 mmol/L (ref 0.5–1.9)

## 2016-11-23 LAB — CREATININE, SERUM
CREATININE: 0.82 mg/dL (ref 0.44–1.00)
GFR calc Af Amer: >60 mL/min (ref >60)

## 2016-11-23 LAB — PROCALCITONIN: Procalcitonin: <0.1 ng/mL

## 2016-11-23 LAB — COMPREHENSIVE METABOLIC PANEL
ALBUMIN: 4.4 g/dL (ref 3.5–5.0)
ALK PHOS: 56 U/L (ref 38–126)
ALT: 20 U/L (ref 14–54)
ANION GAP: 15 (ref 5–15)
AST: 42 U/L — ABNORMAL HIGH (ref 15–41)
BUN: 9 mg/dL (ref 6–20)
CALCIUM: 9.5 mg/dL (ref 8.9–10.3)
CO2: 20 mmol/L — AB (ref 22–32)
Chloride: 102 mmol/L (ref 101–111)
Creatinine, Ser: 1.07 mg/dL — ABNORMAL HIGH (ref 0.44–1.00)
GFR calc Af Amer: >60 mL/min (ref >60)
GFR calc non Af Amer: >60 mL/min (ref >60)
GLUCOSE: 156 mg/dL — AB (ref 65–99)
Potassium: 3.4 mmol/L — ABNORMAL LOW (ref 3.5–5.1)
SODIUM: 137 mmol/L (ref 135–145)
Total Bilirubin: 0.5 mg/dL (ref 0.3–1.2)
Total Protein: 7.7 g/dL (ref 6.5–8.1)

## 2016-11-23 LAB — LACTIC ACID, PLASMA
LACTIC ACID, VENOUS: 2.4 mmol/L — AB (ref 0.5–1.9)
Lactic Acid, Venous: 1.5 mmol/L (ref 0.5–1.9)

## 2016-11-23 LAB — LIPASE, BLOOD: Lipase: 13 U/L (ref 11–51)

## 2016-11-23 LAB — I-STAT BETA HCG BLOOD, ED (MC, WL, AP ONLY)

## 2016-11-23 LAB — OCCULT BLOOD X 1 CARD TO LAB, STOOL: Fecal Occult Bld: NEGATIVE

## 2016-11-23 MED ORDER — ONDANSETRON HCL 4 MG/2ML IJ SOLN
4.0000 mg | Freq: Four times a day (QID) | INTRAMUSCULAR | Status: DC | PRN
  Administered 2016-11-24 (×2): 4 mg via INTRAVENOUS
  Filled 2016-11-23 (×2): qty 2

## 2016-11-23 MED ORDER — ONDANSETRON HCL 4 MG/2ML IJ SOLN: 4.0000 mg | Freq: Once | INTRAMUSCULAR | Status: DC

## 2016-11-23 MED ORDER — PIPERACILLIN-TAZOBACTAM 3.375 G IVPB
3.3750 g | Freq: Three times a day (TID) | INTRAVENOUS | Status: DC
  Filled 2016-11-23: qty 50

## 2016-11-23 MED ORDER — PIPERACILLIN-TAZOBACTAM 3.375 G IVPB 30 MIN
3.3750 g | Freq: Once | INTRAVENOUS | Status: AC
  Administered 2016-11-23: 3.375 g via INTRAVENOUS
  Filled 2016-11-23: qty 50

## 2016-11-23 MED ORDER — OLANZAPINE 7.5 MG PO TABS: 15.0000 mg | ORAL_TABLET | Freq: Every day | ORAL | Status: DC

## 2016-11-23 MED ORDER — KETOROLAC TROMETHAMINE 15 MG/ML IJ SOLN
15.0000 mg | Freq: Once | INTRAMUSCULAR | Status: AC
  Administered 2016-11-23: 15 mg via INTRAVENOUS
  Filled 2016-11-23: qty 1

## 2016-11-23 MED ORDER — SODIUM CHLORIDE 0.9 % IV BOLUS (SEPSIS)
1000.0000 mL | Freq: Once | INTRAVENOUS | Status: AC
  Administered 2016-11-23: 1000 mL via INTRAVENOUS

## 2016-11-23 MED ORDER — IOPAMIDOL (ISOVUE-300) INJECTION 61%
INTRAVENOUS | Status: AC
  Administered 2016-11-23: 100 mL
  Filled 2016-11-23: qty 100

## 2016-11-23 MED ORDER — DIPHENHYDRAMINE HCL 50 MG/ML IJ SOLN
25.0000 mg | Freq: Once | INTRAMUSCULAR | Status: AC
Start: 2016-11-23 — End: 2016-11-23
  Administered 2016-11-23: 25 mg via INTRAVENOUS
  Filled 2016-11-23: qty 1

## 2016-11-23 MED ORDER — MORPHINE SULFATE (PF) 4 MG/ML IV SOLN
4.0000 mg | Freq: Once | INTRAVENOUS | Status: AC
Start: 2016-11-23 — End: 2016-11-23
  Administered 2016-11-23: 4 mg via INTRAVENOUS
  Filled 2016-11-23: qty 1

## 2016-11-23 MED ORDER — FOLIC ACID 1 MG PO TABS
1.0000 mg | ORAL_TABLET | Freq: Every day | ORAL | Status: DC
  Administered 2016-11-23: 1 mg via ORAL
  Filled 2016-11-23 (×2): qty 1

## 2016-11-23 MED ORDER — LORAZEPAM 1 MG PO TABS
1.0000 mg | ORAL_TABLET | Freq: Four times a day (QID) | ORAL | Status: DC | PRN
  Filled 2016-11-23: qty 1

## 2016-11-23 MED ORDER — OLANZAPINE 7.5 MG PO TABS
15.0000 mg | ORAL_TABLET | Freq: Every day | ORAL | Status: DC
  Administered 2016-11-23 – 2016-11-24 (×2): 15 mg via ORAL
  Filled 2016-11-23 (×2): qty 2

## 2016-11-23 MED ORDER — ONDANSETRON HCL 4 MG PO TABS: 4.0000 mg | ORAL_TABLET | Freq: Four times a day (QID) | ORAL | Status: DC | PRN

## 2016-11-23 MED ORDER — POTASSIUM CHLORIDE CRYS ER 20 MEQ PO TBCR
40.0000 meq | EXTENDED_RELEASE_TABLET | Freq: Once | ORAL | Status: AC
  Administered 2016-11-23: 40 meq via ORAL
  Filled 2016-11-23: qty 2

## 2016-11-23 MED ORDER — METOCLOPRAMIDE HCL 5 MG/ML IJ SOLN
10.0000 mg | Freq: Once | INTRAMUSCULAR | Status: AC
  Administered 2016-11-23: 10 mg via INTRAVENOUS
  Filled 2016-11-23: qty 2

## 2016-11-23 MED ORDER — KCL IN DEXTROSE-NACL 20-5-0.45 MEQ/L-%-% IV SOLN
Freq: Once | INTRAVENOUS | Status: DC
  Filled 2016-11-23 (×2): qty 1000

## 2016-11-23 MED ORDER — HALOPERIDOL LACTATE 5 MG/ML IJ SOLN
2.0000 mg | Freq: Once | INTRAMUSCULAR | Status: AC
  Administered 2016-11-23: 2 mg via INTRAVENOUS
  Filled 2016-11-23: qty 1

## 2016-11-23 MED ORDER — THIAMINE HCL 100 MG/ML IJ SOLN: 100.0000 mg | Freq: Every day | INTRAMUSCULAR | Status: DC

## 2016-11-23 MED ORDER — VITAMIN B-1 100 MG PO TABS
100.0000 mg | ORAL_TABLET | Freq: Every day | ORAL | Status: DC
  Administered 2016-11-23: 100 mg via ORAL
  Filled 2016-11-23 (×2): qty 1

## 2016-11-23 MED ORDER — SODIUM CHLORIDE 0.9 % IV SOLN
INTRAVENOUS | Status: DC
  Administered 2016-11-23 – 2016-11-24 (×2): via INTRAVENOUS

## 2016-11-23 MED ORDER — NICOTINE 14 MG/24HR TD PT24
14.0000 mg | MEDICATED_PATCH | Freq: Every day | TRANSDERMAL | Status: DC
  Administered 2016-11-24: 14 mg via TRANSDERMAL
  Filled 2016-11-23 (×2): qty 1

## 2016-11-23 MED ORDER — LORAZEPAM 2 MG/ML IJ SOLN
1.0000 mg | Freq: Four times a day (QID) | INTRAMUSCULAR | Status: DC | PRN
  Administered 2016-11-24 (×2): 1 mg via INTRAVENOUS
  Filled 2016-11-23 (×2): qty 1

## 2016-11-23 MED ORDER — ONDANSETRON HCL 4 MG/2ML IJ SOLN
4.0000 mg | Freq: Once | INTRAMUSCULAR | Status: AC
Start: 2016-11-23 — End: 2016-11-23
  Administered 2016-11-23: 4 mg via INTRAVENOUS
  Filled 2016-11-23: qty 2

## 2016-11-23 MED ORDER — ENOXAPARIN SODIUM 40 MG/0.4ML ~~LOC~~ SOLN
40.0000 mg | SUBCUTANEOUS | Status: DC
  Administered 2016-11-23 – 2016-11-24 (×2): 40 mg via SUBCUTANEOUS
  Filled 2016-11-23 (×2): qty 0.4

## 2016-11-23 MED ORDER — ADULT MULTIVITAMIN W/MINERALS CH
1.0000 | ORAL_TABLET | Freq: Every day | ORAL | Status: DC
  Administered 2016-11-23 – 2016-11-25 (×2): 1 via ORAL
  Filled 2016-11-23 (×3): qty 1

## 2016-11-23 MED ORDER — HYDRALAZINE HCL 20 MG/ML IJ SOLN: 10.0000 mg | Freq: Three times a day (TID) | INTRAMUSCULAR | Status: DC | PRN

## 2016-11-23 NOTE — ED Notes (Signed)
Patient transported to CT 

## 2016-11-23 NOTE — ED Notes (Signed)
Dr. Penne LashIssacs notified of elevated CG-4 of 2.66

## 2016-11-23 NOTE — ED Triage Notes (Signed)
Pt states for the past day has been having n/v/d, denies fevers, generalized abd pain.

## 2016-11-23 NOTE — Progress Notes (Signed)
Pharmacy Antibiotic Note  Taylor Bartlett is a 31 y.o. female admitted on 11/23/2016 with intra-abdominal infection.  Pharmacy has been consulted for zosyn dosing. Pt is afebrile but WBC is elevated at 17.4. Scr is WNL at 1.07 and lactic acid is elevated at 4.89.   Plan: Zosyn 3.375gm IV Q8H (4 hr inf) F/u renal fxn, C&S, clinical status  *Pharmacy will sign-off as no dose adjustments are anticipated. Thank you for the consult!  Height: 5\' 3"  (160 cm) Weight: 141 lb (64 kg) IBW/kg (Calculated) : 52.4  Temp (24hrs), Avg:98.3 F (36.8 C), Min:98.3 F (36.8 C), Max:98.3 F (36.8 C)   Recent Labs Lab 11/23/16 0710 11/23/16 0853 11/23/16 1218  WBC 17.4*  --   --   CREATININE 1.07*  --   --   LATICACIDVEN  --  3.79* 4.89*    Estimated Creatinine Clearance: 69.2 mL/min (by C-G formula based on SCr of 1.07 mg/dL (H)).    Allergies  Allergen Reactions  . Tomato Anaphylaxis and Itching    Antimicrobials this admission: Zosyn 1/2>>  Dose adjustments this admission: N/A  Microbiology results: Pending  Thank you for allowing pharmacy to be a part of this patient's care.  [name], [name] 11/23/2016 12:23 PM

## 2016-11-23 NOTE — ED Provider Notes (Signed)
[facility]-EMERGENCY DEPT Provider Note   CSN: [id number] Arrival date & time: 11/23/16  4098     History   Chief Complaint Chief Complaint  Patient presents with  . Abdominal Pain    HPI Taylor Bartlett is a 31 y.o. female.  HPI 31 year old female with history of chronic alcoholism, hypertension, gastroparesis, chronic abdominal pain who with nausea and vomiting. The patient states her symptoms started approximately 24 hours ago as gradual onset of aching, gnawing, epigastric abdominal pain. She then began developing initially profuse, watery vomiting followed by occasional blood-tinged emesis. She has also had significant diarrhea that is also nonbloody and nonbilious. She has associated general fatigue. Denies any known fevers. No known recent sick contacts. Symptoms feel similar to the last time she drank alcohol. No other medical complaints. Symptoms worsen with eating and palpation.  Past Medical History:  Diagnosis Date  . Alcohol abuse   . Anxiety   . Gastroparesis   . Hypertension   . Reflux   . Seasonal allergies     Patient Active Problem List   Diagnosis Date Noted  . Dehydration 11/23/2016  . UTI (urinary tract infection) 09/23/2016  . Intractable cyclical vomiting with nausea   . Depression   . Hypokalemia   . Polysubstance abuse   . Bipolar affective disorder, currently depressed, mild (HCC)   . Cyclic vomiting syndrome 09/22/2015    History reviewed. No pertinent surgical history.  OB History    No data available       Home Medications    Prior to Admission medications   Medication Sig Start Date End Date Taking? Authorizing Provider  hydrOXYzine (ATARAX/VISTARIL) 50 MG tablet Take 50 mg by mouth 2 (two) times daily.    Yes Historical Provider, MD  OLANZapine (ZYPREXA) 15 MG tablet Take 15 mg by mouth at bedtime.   Yes Historical Provider, MD  promethazine (PHENERGAN) 25 MG tablet Take 1 tablet (25 mg total) by mouth every 6 (six) hours as needed  for nausea or vomiting. 09/22/16  Yes Mercedes Camprubi-Soms, PA-C  ciprofloxacin (CIPRO) 500 MG tablet Take 1 tablet (500 mg total) by mouth every 12 (twelve) hours. Patient not taking: Reported on 11/23/2016 09/23/16   Rolland Porter, MD  ibuprofen (ADVIL,MOTRIN) 600 MG tablet Take 1 tablet (600 mg total) by mouth every 6 (six) hours as needed. Patient not taking: Reported on 11/23/2016 09/04/16   Barrett Henle, PA-C  metoCLOPramide (REGLAN) 10 MG tablet Take 1 tablet (10 mg total) by mouth every 6 (six) hours as needed for nausea (nausea/headache). Patient not taking: Reported on 11/23/2016 09/18/16   Preston Fleeting, MD  nicotine (NICODERM CQ - DOSED IN MG/24 HOURS) 21 mg/24hr patch Place 1 patch (21 mg total) onto the skin daily. Patient not taking: Reported on 11/23/2016 09/24/15   Fuller Plan, MD  omeprazole (PRILOSEC) 20 MG capsule Take 1 capsule (20 mg total) by mouth daily. Patient not taking: Reported on 11/23/2016 09/24/15   Fuller Plan, MD  ondansetron (ZOFRAN ODT) 4 MG disintegrating tablet Take 1 tablet (4 mg total) by mouth every 8 (eight) hours as needed for nausea or vomiting. Patient not taking: Reported on 11/23/2016 02/14/16   Gerhard Munch, MD  promethazine (PHENERGAN) 25 MG suppository Place 1 suppository (25 mg total) rectally every 6 (six) hours as needed for nausea or vomiting. Patient not taking: Reported on 11/23/2016 09/18/16   Eyvonne Mechanic, PA-C  ranitidine (ZANTAC) 150 MG tablet Take 1 tablet (150 mg total) by mouth  2 (two) times daily. Patient not taking: Reported on 11/23/2016 09/22/16   Mercedes Camprubi-Soms, PA-C    Family History No family history on file.  Social History Social History  Substance Use Topics  . Smoking status: Current Every Day Smoker    Packs/day: 0.50    Types: Cigarettes  . Smokeless tobacco: Never Used  . Alcohol use 1.8 oz/week    3 Cans of beer per week     Allergies   Tomato   Review of Systems Review of Systems    Constitutional: Positive for fatigue. Negative for chills and fever.  HENT: Negative for congestion and rhinorrhea.   Eyes: Negative for visual disturbance.  Respiratory: Negative for cough, shortness of breath and wheezing.   Cardiovascular: Negative for chest pain and leg swelling.  Gastrointestinal: Positive for abdominal pain, nausea and vomiting. Negative for diarrhea.  Genitourinary: Negative for dysuria and flank pain.  Musculoskeletal: Negative for neck pain and neck stiffness.  Skin: Negative for rash and wound.  Allergic/Immunologic: Negative for immunocompromised state.  Neurological: Negative for syncope, weakness and headaches.  All other systems reviewed and are negative.    Physical Exam Updated Vital Signs BP 136/81   Pulse 91   Temp 98.3 F (36.8 C) (Oral)   Resp 20   Ht 5\' 3"  (1.6 m)   Wt 141 lb (64 kg)   LMP 11/18/2016   SpO2 100%   BMI 24.98 kg/m   Physical Exam  Constitutional: She is oriented to person, place, and time. She appears well-developed and well-nourished. No distress.  HENT:  Head: Normocephalic and atraumatic.  Eyes: Conjunctivae are normal.  Neck: Neck supple.  Cardiovascular: Normal rate, regular rhythm and normal heart sounds.  Exam reveals no friction rub.   No murmur heard. Pulmonary/Chest: Effort normal and breath sounds normal. No respiratory distress. She has no wheezes. She has no rales.  Abdominal: Soft. Bowel sounds are normal. She exhibits no distension. There is tenderness (Moderate, diffuse, worst in the epigastric area). There is no rebound and no guarding.  Musculoskeletal: She exhibits no edema.  Neurological: She is alert and oriented to person, place, and time. She exhibits normal muscle tone.  Skin: Skin is warm. Capillary refill takes less than 2 seconds.  Psychiatric: She has a normal mood and affect.  Nursing note and vitals reviewed.    ED Treatments / Results  Labs (all labs ordered are listed, but only  abnormal results are displayed) Labs Reviewed  COMPREHENSIVE METABOLIC PANEL - Abnormal; Notable for the following:       Result Value   Potassium 3.4 (*)    CO2 20 (*)    Glucose, Bld 156 (*)    Creatinine, Ser 1.07 (*)    AST 42 (*)    All other components within normal limits  CBC - Abnormal; Notable for the following:    WBC 17.4 (*)    All other components within normal limits  URINALYSIS, ROUTINE W REFLEX MICROSCOPIC - Abnormal; Notable for the following:    Hgb urine dipstick MODERATE (*)    Protein, ur 30 (*)    Squamous Epithelial / LPF 0-5 (*)    All other components within normal limits  I-STAT CG4 LACTIC ACID, ED - Abnormal; Notable for the following:    Lactic Acid, Venous 3.79 (*)    All other components within normal limits  I-STAT CG4 LACTIC ACID, ED - Abnormal; Notable for the following:    Lactic Acid, Venous 4.89 (*)  All other components within normal limits  I-STAT CG4 LACTIC ACID, ED - Abnormal; Notable for the following:    Lactic Acid, Venous 2.66 (*)    All other components within normal limits  CULTURE, BLOOD (ROUTINE X 2)  CULTURE, BLOOD (ROUTINE X 2)  LIPASE, BLOOD  PROCALCITONIN  I-STAT BETA HCG BLOOD, ED ([facility], [facility], [facility] ONLY)  I-STAT CG4 LACTIC ACID, ED    EKG  EKG Interpretation None       Radiology Ct Abdomen Pelvis W Contrast  Result Date: 11/23/2016 CLINICAL DATA:  Abdominal pain EXAM: CT ABDOMEN AND PELVIS WITH CONTRAST TECHNIQUE: Multidetector CT imaging of the abdomen and pelvis was performed using the standard protocol following bolus administration of intravenous contrast. CONTRAST:  100mL ISOVUE-300 IOPAMIDOL (ISOVUE-300) INJECTION 61% COMPARISON:  04/01/2015 FINDINGS: Lower chest: Lung bases are clear. No effusions. Heart is normal size. Hepatobiliary: No focal hepatic abnormality. Gallbladder unremarkable. Pancreas: No focal abnormality or ductal dilatation. Spleen: No focal abnormality.  Normal size. Adrenals/Urinary Tract: No  adrenal abnormality. No focal renal abnormality. No stones or hydronephrosis. Urinary bladder is unremarkable. Stomach/Bowel: Stomach is within normal limits. Appendix appears normal. No evidence of bowel wall thickening, distention, or inflammatory changes. Vascular/Lymphatic: No evidence of aneurysm or adenopathy. Reproductive: Uterus and adnexa unremarkable.  No mass. Other: No free fluid or free air. Musculoskeletal: No acute bony abnormality or focal bone lesion. IMPRESSION: Normal appendix.  Unremarkable study. Electronically Signed   By: Charlett NoseKevin  Dover M.D.   On: 11/23/2016 09:38    Procedures Procedures (including critical care time)  Medications Ordered in ED Medications  piperacillin-tazobactam (ZOSYN) IVPB 3.375 g (not administered)  dextrose 5 % and 0.45 % NaCl with KCl 20 mEq/L infusion ( Intravenous Transfusing/Transfer 11/23/16 1332)  sodium chloride 0.9 % bolus 1,000 mL (0 mLs Intravenous Stopped 11/23/16 0844)  metoCLOPramide (REGLAN) injection 10 mg (10 mg Intravenous Given 11/23/16 0725)  diphenhydrAMINE (BENADRYL) injection 25 mg (25 mg Intravenous Given 11/23/16 0724)  haloperidol lactate (HALDOL) injection 2 mg (2 mg Intravenous Given 11/23/16 0813)  morphine 4 MG/ML injection 4 mg (4 mg Intravenous Given 11/23/16 0815)  sodium chloride 0.9 % bolus 1,000 mL (0 mLs Intravenous Stopped 11/23/16 0938)  iopamidol (ISOVUE-300) 61 % injection (100 mLs  Contrast Given 11/23/16 0916)  sodium chloride 0.9 % bolus 1,000 mL (0 mLs Intravenous Stopped 11/23/16 1105)  ondansetron (ZOFRAN) injection 4 mg (4 mg Intravenous Given 11/23/16 1020)  ketorolac (TORADOL) 15 MG/ML injection 15 mg (15 mg Intravenous Given 11/23/16 1021)  morphine 4 MG/ML injection 4 mg (4 mg Intravenous Given 11/23/16 1022)  piperacillin-tazobactam (ZOSYN) IVPB 3.375 g (0 g Intravenous Stopped 11/23/16 1332)  potassium chloride SA (K-DUR,KLOR-CON) CR tablet 40 mEq (40 mEq Oral Given 11/23/16 1257)     Initial Impression / Assessment and  Plan / ED Course  I have reviewed the triage vital signs and the nursing notes.  Pertinent labs & imaging results that were available during my care of the patient were reviewed by me and considered in my medical decision making (see chart for details).  Clinical Course     31 yo F with PMHx as above here with nausea, vomiting, and abdominal pain. On arrival, VSS and WNL. Exam is as above - pt in obvious distress with dry MM, mild epigastric TTP. Suspect gastritis versus gastroparesis versus cyclical vomiting versus marijuana hyperemesis syndrome. Will check labs, IVF, and obtain CT given TTP on exam.  Labs show moderate leukocytosis, o/w baseline renal function. UA without  signs of UTI. LA elevated at 3.8 - will continue IVF. CT pending.  CT shows NAICA. Of note, LA now elevated to 4.9. Pt markedly improved on exam and I suspect this is 2/2 reperfusion in setting of hydration; however, will continue IVF, give Zosyn for LA>4, and admit to medicine.  Final Clinical Impressions(s) / ED Diagnoses   Final diagnoses:  Lactic acidosis  Dehydration  Intractable vomiting with nausea, unspecified vomiting type    New Prescriptions Current Discharge Medication List       Shaune Pollack, MD 11/23/16 1726

## 2016-11-23 NOTE — Progress Notes (Signed)
Lactic Acid level drawn at 1820 is 2.4. Result relayed to on call MD via text.

## 2016-11-23 NOTE — H&P (Signed)
Family Medicine Teaching [facility] Admission History and Physical Service Pager: [phone]  Patient name: Taylor Bartlett Medical record number: [id number] Date of birth: May 24, 1986 Age: 31 y.o. Gender: female  Primary Care Provider: No PCP Per Patient Consultants: none Code Status: FULL  Chief Complaint: Intractable nausea and vomiting  Assessment and Plan: Taylor Bartlett is a 31 y.o. female presenting with intractable nausea and vomiting 1 day. PMH is significant for cyclical vomiting syndrome, depression, polysubstance abuse, bipolar affective disorder.  # Dehydration with elevated Lactic Acid: Suspected cause is intractable nausea and vomiting. Patient has had issues with cyclic vomiting syndrome in the past. States that this was similar to these issues she had in the past. She denies any significant abdominal pain, dysuria, shortness of breath, chest pain, or fevers. Beta hCG negative, LMP 11/15/16. Endorses normal stools. Lactic acid 3.8 >> 4.9 >> 2.7 in the ED (likely related to dehydration). Lipase WNL. EKG unremarkable. CT abdomen unremarkable. - Admit to observation to family medicine teaching service; attending physician Dr. Randolm IdolFletke - Push fluids; 1 L NS bolus now followed by 110 mL/hr - S/p 1 dose of Zosyn in ED >> I do not feel strongly that this is related to an infectious cause. Will DC. - Zofran when necessary - Repeat lactate - UA with microscopy - Repeat BMP in a.m.  # AKI: Likely prerenal due to dehydration. Baseline appears to be ~0.8. - Cr 1.07 on admission - Aggressive IV fluid replacement - Allow PO as tolerated - Avoid nephrotoxic medications - Repeat BMP in a.m.  # Alcohol misuse/abuse: Patient admitted that she does not typically drink. However she states that she had a significant amount of alcohol over New Year's Day which she believes is the likely cause of her intractable nausea and vomiting. - CIWA protocol.  # Melena: Patient endorses  melena that started 1 or 2 days ago. Stools are black in color and relatively loose. She says this is a new finding for her. - FOBT to be collected by nursing with next stool.  # Bipolar affective disorder: - Continue home Zyprexa  # Tobacco use/abuse: - Nicotine patch ordered  FEN/GI: Heart healthy diet; IV NS bolus followed by 16710ml/hr Prophylaxis: Lovenox  Disposition: Home once medically stable  History of Present Illness:  Taylor Bartlett is a 31 y.o. female presenting with intractable nausea and vomiting 1 day. Patient states that beginning yesterday she had a substantial amount of nausea and vomiting. She states that she had gone out for [employer] and had a significant amount of alcohol. She does not remember the end of the night. The following morning she says she was "still drunk" and felt very thirsty. During the day yesterday she drank "8" Powerades, but was unable to keep them down due to her persistent nausea and vomiting. She believes that most of her symptoms were due to her drinking too much the night before. It wasn't until this morning when she continued to have nausea and vomiting, although improved, that she decided to report to the ED. She denies any symptoms of fever, chills, dysuria, shortness of breath, chest pain, diarrhea, or significant abdominal pain. Patient does endorse recent episodes of melena that she says started 2 days ago. She denies hematochezia.  Review Of Systems: Per HPI  ROS  Patient Active Problem List   Diagnosis Date Noted  . Dehydration 11/23/2016  . UTI (urinary tract infection) 09/23/2016  . Intractable cyclical vomiting with nausea   . Depression   .  Hypokalemia   . Polysubstance abuse   . Bipolar affective disorder, currently depressed, mild (HCC)   . Cyclic vomiting syndrome 09/22/2015    Past Medical History: Past Medical History:  Diagnosis Date  . Alcohol abuse   . Anxiety   . Gastroparesis   . Hypertension   .  Reflux   . Seasonal allergies     Past Surgical History: History reviewed. No pertinent surgical history.  Social History: Social History  Substance Use Topics  . Smoking status: Current Every Day Smoker    Packs/day: 0.50    Types: Cigarettes  . Smokeless tobacco: Never Used  . Alcohol use 1.8 oz/week    3 Cans of beer per week   Additional social history:   Please also refer to relevant sections of EMR.  Family History: No family history on file.  Allergies and Medications: Allergies  Allergen Reactions  . Tomato Anaphylaxis and Itching   No current facility-administered medications on file prior to encounter.    Current Outpatient Prescriptions on File Prior to Encounter  Medication Sig Dispense Refill  . hydrOXYzine (ATARAX/VISTARIL) 50 MG tablet Take 50 mg by mouth 2 (two) times daily.     . promethazine (PHENERGAN) 25 MG tablet Take 1 tablet (25 mg total) by mouth every 6 (six) hours as needed for nausea or vomiting. 30 tablet 0  . ciprofloxacin (CIPRO) 500 MG tablet Take 1 tablet (500 mg total) by mouth every 12 (twelve) hours. (Patient not taking: Reported on 11/23/2016) 6 tablet 0  . ibuprofen (ADVIL,MOTRIN) 600 MG tablet Take 1 tablet (600 mg total) by mouth every 6 (six) hours as needed. (Patient not taking: Reported on 11/23/2016) 30 tablet 0  . metoCLOPramide (REGLAN) 10 MG tablet Take 1 tablet (10 mg total) by mouth every 6 (six) hours as needed for nausea (nausea/headache). (Patient not taking: Reported on 11/23/2016) 12 tablet 0  . nicotine (NICODERM CQ - DOSED IN MG/24 HOURS) 21 mg/24hr patch Place 1 patch (21 mg total) onto the skin daily. (Patient not taking: Reported on 11/23/2016) 28 patch 0  . omeprazole (PRILOSEC) 20 MG capsule Take 1 capsule (20 mg total) by mouth daily. (Patient not taking: Reported on 11/23/2016) 30 capsule 3  . ondansetron (ZOFRAN ODT) 4 MG disintegrating tablet Take 1 tablet (4 mg total) by mouth every 8 (eight) hours as needed for nausea or  vomiting. (Patient not taking: Reported on 11/23/2016) 10 tablet 0  . promethazine (PHENERGAN) 25 MG suppository Place 1 suppository (25 mg total) rectally every 6 (six) hours as needed for nausea or vomiting. (Patient not taking: Reported on 11/23/2016) 12 each 0  . ranitidine (ZANTAC) 150 MG tablet Take 1 tablet (150 mg total) by mouth 2 (two) times daily. (Patient not taking: Reported on 11/23/2016) 30 tablet 0    Objective: BP 136/81   Pulse 91   Temp 98.3 F (36.8 C) (Oral)   Resp 20   Ht 5\' 3"  (1.6 m)   Wt 141 lb (64 kg)   LMP 11/18/2016   SpO2 100%   BMI 24.98 kg/m  Exam: General -- oriented x3, pleasant and cooperative. HEENT -- Head is normocephalic. PERRLA. EOMI. Ears, nose and throat were benign. Neck -- supple; no bruits. Integument -- intact. No rash, erythema, or ecchymoses.  Chest -- good expansion. Lungs clear to auscultation. Cardiac -- RRR. No murmurs noted.  Abdomen -- soft, nontender. No masses palpable. Bowel sounds present. No CVA tenderness. CNS -- cranial nerves II through XII grossly  intact. 2+ reflexes bilaterally. Extremeties - no tenderness or effusions noted. ROM good. 5/5 bilateral strength. Dorsalis pedis pulses present and symmetrical.    Labs and Imaging: CBC BMET   Recent Labs Lab 11/23/16 0710  WBC 17.4*  HGB 12.9  HCT 38.2  PLT 236    Recent Labs Lab 11/23/16 0710  NA 137  K 3.4*  CL 102  CO2 20*  BUN 9  CREATININE 1.07*  GLUCOSE 156*  CALCIUM 9.5      Kathee Delton, MD 11/23/2016, 5:19 PM PGY-3, [facility] FPTS Intern pager: [phone], text pages welcome

## 2016-11-23 NOTE — ED Notes (Signed)
Pt denies abdominal pain and N/V/D at this time. Given powerade to patient for PO challenge.

## 2016-11-24 LAB — BASIC METABOLIC PANEL
ANION GAP: 6 (ref 5–15)
BUN: <5 mg/dL — ABNORMAL LOW (ref 6–20)
CALCIUM: 8.6 mg/dL — AB (ref 8.9–10.3)
CO2: 24 mmol/L (ref 22–32)
Chloride: 109 mmol/L (ref 101–111)
Creatinine, Ser: 0.77 mg/dL (ref 0.44–1.00)
GFR calc Af Amer: >60 mL/min (ref >60)
GLUCOSE: 89 mg/dL (ref 65–99)
POTASSIUM: 3.5 mmol/L (ref 3.5–5.1)
Sodium: 139 mmol/L (ref 135–145)

## 2016-11-24 LAB — BLOOD CULTURE ID PANEL (REFLEXED)
Acinetobacter baumannii: NOT DETECTED
CANDIDA ALBICANS: NOT DETECTED
CANDIDA TROPICALIS: NOT DETECTED
Candida glabrata: NOT DETECTED
Candida krusei: NOT DETECTED
Candida parapsilosis: NOT DETECTED
ENTEROBACTER CLOACAE COMPLEX: NOT DETECTED
ENTEROBACTERIACEAE SPECIES: NOT DETECTED
ENTEROCOCCUS SPECIES: NOT DETECTED
Escherichia coli: NOT DETECTED
HAEMOPHILUS INFLUENZAE: NOT DETECTED
KLEBSIELLA PNEUMONIAE: NOT DETECTED
Klebsiella oxytoca: NOT DETECTED
LISTERIA MONOCYTOGENES: NOT DETECTED
METHICILLIN RESISTANCE: NOT DETECTED
NEISSERIA MENINGITIDIS: NOT DETECTED
PROTEUS SPECIES: NOT DETECTED
Pseudomonas aeruginosa: NOT DETECTED
STAPHYLOCOCCUS SPECIES: DETECTED — AB
STREPTOCOCCUS AGALACTIAE: NOT DETECTED
STREPTOCOCCUS SPECIES: NOT DETECTED
Serratia marcescens: NOT DETECTED
Staphylococcus aureus (BCID): NOT DETECTED
Streptococcus pneumoniae: NOT DETECTED
Streptococcus pyogenes: NOT DETECTED

## 2016-11-24 MED ORDER — ACETAMINOPHEN 325 MG PO TABS
650.0000 mg | ORAL_TABLET | Freq: Four times a day (QID) | ORAL | Status: DC | PRN
  Administered 2016-11-24: 650 mg via ORAL
  Filled 2016-11-24: qty 2

## 2016-11-24 MED ORDER — GI COCKTAIL ~~LOC~~
30.0000 mL | Freq: Three times a day (TID) | ORAL | Status: DC | PRN
  Administered 2016-11-24: 30 mL via ORAL
  Filled 2016-11-24 (×2): qty 30

## 2016-11-24 MED ORDER — KETOROLAC TROMETHAMINE 15 MG/ML IJ SOLN
15.0000 mg | Freq: Once | INTRAMUSCULAR | Status: AC
  Administered 2016-11-24: 15 mg via INTRAVENOUS
  Filled 2016-11-24: qty 1

## 2016-11-24 MED ORDER — PANTOPRAZOLE SODIUM 40 MG PO TBEC
40.0000 mg | DELAYED_RELEASE_TABLET | Freq: Every day | ORAL | Status: DC
  Administered 2016-11-24 – 2016-11-25 (×2): 40 mg via ORAL
  Filled 2016-11-24 (×2): qty 1

## 2016-11-24 MED ORDER — GI COCKTAIL ~~LOC~~
30.0000 mL | Freq: Once | ORAL | Status: AC
  Administered 2016-11-24: 30 mL via ORAL
  Filled 2016-11-24 (×2): qty 30

## 2016-11-24 NOTE — Progress Notes (Signed)
Patient evaluated for vomiting with abdominal pain. Patient laying in bed with bed pan full of NBNB emesis (~2-3 oz). Patient states she has "belly pain under her belly button." Normoactive bowel sounds present. Minimal tenderness with deep palpation of abdomen w/o signs of acute abdomen. Patient states she has "vomited up all of her meds throughout the day." Has been able to drink water, powerade, and ginger ale, but continues to vomit. Will give GI cocktail and continue Zofran. -- Durward Parcelavid McMullen, DO [facility], PGY-1

## 2016-11-24 NOTE — Progress Notes (Signed)
Pt had a GI cocktail at 1500hrs reporting relief earlier, now c/o abdominal pain. MD paged

## 2016-11-24 NOTE — Progress Notes (Signed)
PHARMACY - PHYSICIAN COMMUNICATION CRITICAL VALUE ALERT - BLOOD CULTURE IDENTIFICATION (BCID)  Results for orders placed or performed during the hospital encounter of 11/23/16  Blood Culture ID Panel (Reflexed) (Collected: 11/23/2016 12:40 PM)  Result Value Ref Range   Enterococcus species NOT DETECTED NOT DETECTED   Listeria monocytogenes NOT DETECTED NOT DETECTED   Staphylococcus species DETECTED (A) NOT DETECTED   Staphylococcus aureus NOT DETECTED NOT DETECTED   Methicillin resistance NOT DETECTED NOT DETECTED   Streptococcus species NOT DETECTED NOT DETECTED   Streptococcus agalactiae NOT DETECTED NOT DETECTED   Streptococcus pneumoniae NOT DETECTED NOT DETECTED   Streptococcus pyogenes NOT DETECTED NOT DETECTED   Acinetobacter baumannii NOT DETECTED NOT DETECTED   Enterobacteriaceae species NOT DETECTED NOT DETECTED   Enterobacter cloacae complex NOT DETECTED NOT DETECTED   Escherichia coli NOT DETECTED NOT DETECTED   Klebsiella oxytoca NOT DETECTED NOT DETECTED   Klebsiella pneumoniae NOT DETECTED NOT DETECTED   Proteus species NOT DETECTED NOT DETECTED   Serratia marcescens NOT DETECTED NOT DETECTED   Haemophilus influenzae NOT DETECTED NOT DETECTED   Neisseria meningitidis NOT DETECTED NOT DETECTED   Pseudomonas aeruginosa NOT DETECTED NOT DETECTED   Candida albicans NOT DETECTED NOT DETECTED   Candida glabrata NOT DETECTED NOT DETECTED   Candida krusei NOT DETECTED NOT DETECTED   Candida parapsilosis NOT DETECTED NOT DETECTED   Candida tropicalis NOT DETECTED NOT DETECTED    Name of physician (or Provider) Contacted: FMTS  Changes to prescribed antibiotics required:None  Bertram MillardMichael A Harlym Gehling 11/24/2016  1:39 PM

## 2016-11-24 NOTE — Progress Notes (Signed)
Family Medicine Teaching Service Daily Progress Note Intern Pager: [phone]  Patient name: Taylor Bartlett Medical record number: [id number] Date of birth: July 29, 1986 Age: 31 y.o. Gender: female  Primary Care Provider: No PCP Per Patient Consultants: none Code Status: FULL  Pt Overview and Major Events to Date:  11/23/16 Placed in observation   Assessment and Plan: Taylor Bartlett is a 31 y.o. female presenting with intractable nausea and vomiting 1 day. PMH is significant for cyclical vomiting syndrome, depression, polysubstance abuse, bipolar affective disorder.  # Dehydration with elevated Lactic Acid, improving: Suspected cause is intractable nausea and vomiting. Patient has had issues with cyclic vomiting syndrome in the past. States that this was similar to these issues she had in the past. She denies any significant abdominal pain, dysuria, shortness of breath, chest pain, or fevers. Beta hCG negative, LMP 11/15/16. Endorses normal stools. Lactic acid 3.8 >> 4.9 >> 2.7 in the ED (likely related to dehydration) now improved >>2.4 >>1.5 WNL. Lipase WNL. EKG unremarkable. CT abdomen unremarkable. - Push fluids; s/p 1 L NS bolus, continue IVF@ 110 mL/hr - S/p 1 dose of Zosyn in ED >> discontinued because unlikely infectious etiology - Zofran when necessary - UA with microscopy - GI cocktail once for abdominal discomfort - blood cultures 1 out of 2 growing staph species, neg for staph aureus, likely contaminate but awaiting further clarification from micro. Will result tomorrow.   # AKI, resolved. Elevated creatinine on admit to 1.07 likely prerenal due to dehydration. Now 0.77 at baseline ~0.8. - Allow PO as tolerated - Avoid nephrotoxic medications  # Alcohol misuse/abuse: Patient admitted that she does not typically drink. However she states that she had a significant amount of alcohol over New Year's Day which she believes is the likely cause of her intractable nausea and  vomiting. - CIWA protocol.  # Melena, resolved: Patient endorses melena that started 1 or 2 days ago. Stools are black in color and relatively loose. She says this is a new finding for her. - FOBT negative  # Bipolar affective disorder: - Continue home Zyprexa  # Tobacco use/abuse: - Nicotine patch ordered  FEN/GI: Heart healthy diet; IV NS bolus followed by 14810ml/hr Prophylaxis: Lovenox  Disposition: home once medically stable  Subjective:  States has abdominal pain with n/v whenever she drinks orange juice but resolves when she drinks powerade and feels hungry overall. Wants to go home today. Denies fever/chills.  Objective: Temp:  [98.1 F (36.7 C)-98.4 F (36.9 C)] 98.2 F (36.8 C) (01/03 0527) Pulse Rate:  [59-103] 59 (01/03 0527) Resp:  [12-22] 20 (01/03 0527) BP: (123-182)/(78-115) 150/88 (01/03 0527) SpO2:  [99 %-100 %] 100 % (01/03 0527) Physical Exam: General: Sitting up in bed, resting comfortably HEENT: [place], AT. Moist mucous membranes Cardiovascular: RRR, no murmurs noted Respiratory: CTAB, normal effort on room air Abdomen: soft, mildly tender to palpation diffusely, nondistended, no masses, +bs Extremities: no edema. Cap refill < 3 sec. Normal ROM  Laboratory:  Recent Labs Lab 11/23/16 0710 11/23/16 1820  WBC 17.4* 17.6*  HGB 12.9 11.7*  HCT 38.2 34.6*  PLT 236 213    Recent Labs Lab 11/23/16 0710 11/23/16 1820 11/24/16 0452  NA 137  --  139  K 3.4*  --  3.5  CL 102  --  109  CO2 20*  --  24  BUN 9  --  <5*  CREATININE 1.07* 0.82 0.77  CALCIUM 9.5  --  8.6*  PROT 7.7  --   --  BILITOT 0.5  --   --   ALKPHOS 56  --   --   ALT 20  --   --   AST 42*  --   --   GLUCOSE 156*  --  89     Imaging/Diagnostic Tests: No results found.  Leland Her, DO 11/24/2016, 7:09 AM PGY-1, [facility] FPTS Intern pager: [phone], text pages welcome

## 2016-11-25 DIAGNOSIS — G43A1 Cyclical vomiting, intractable: Secondary | ICD-10-CM

## 2016-11-25 LAB — BASIC METABOLIC PANEL
Anion gap: 7 (ref 5–15)
BUN: 5 mg/dL — AB (ref 6–20)
CALCIUM: 8.4 mg/dL — AB (ref 8.9–10.3)
CO2: 24 mmol/L (ref 22–32)
CREATININE: 0.76 mg/dL (ref 0.44–1.00)
Chloride: 105 mmol/L (ref 101–111)
GFR calc Af Amer: >60 mL/min (ref >60)
GFR calc non Af Amer: >60 mL/min (ref >60)
Glucose, Bld: 90 mg/dL (ref 65–99)
Potassium: 2.9 mmol/L — ABNORMAL LOW (ref 3.5–5.1)
Sodium: 136 mmol/L (ref 135–145)

## 2016-11-25 MED ORDER — POTASSIUM CHLORIDE CRYS ER 20 MEQ PO TBCR
40.0000 meq | EXTENDED_RELEASE_TABLET | Freq: Once | ORAL | Status: AC
  Administered 2016-11-25: 40 meq via ORAL
  Filled 2016-11-25: qty 2

## 2016-11-25 MED ORDER — HYDROCHLOROTHIAZIDE 25 MG PO TABS: 25.0000 mg | ORAL_TABLET | Freq: Every day | ORAL | Status: DC

## 2016-11-25 MED ORDER — POTASSIUM CHLORIDE CRYS ER 20 MEQ PO TBCR: EXTENDED_RELEASE_TABLET | ORAL | 0 refills | Status: DC

## 2016-11-25 MED ORDER — HYDROCHLOROTHIAZIDE 25 MG PO TABS: 25.0000 mg | ORAL_TABLET | Freq: Every day | ORAL | 0 refills | Status: DC

## 2016-11-25 MED ORDER — POTASSIUM CHLORIDE CRYS ER 20 MEQ PO TBCR: 40.0000 meq | EXTENDED_RELEASE_TABLET | Freq: Two times a day (BID) | ORAL | Status: DC

## 2016-11-25 NOTE — Progress Notes (Signed)
Patient feeling better today although she was pretty angry this morning because she was kept at the hospital overnight. I explained to her that this was indicated given positive blood culture. She insisted on being discharged home today. She has not had any vomiting since yesterday. She denies any abdominal pain. Physical exam grossly benign.  I reviewed and discussed her blood culture report with the microbiologist. So far only one bottle is positive with staph species coagulase Neg. Likely contaminant. Plan to discharge her home today.  K+ was low this morning and we replete it. We will also send her home on one dose of Kdur tomorrow. She is advised PCP follow up tomorrow or Monday for Bmet check. Return precaution discussed.

## 2016-11-25 NOTE — Discharge Summary (Signed)
Family Medicine Teaching [facility] Discharge Summary  Patient name: Taylor Bartlett Medical record number: [id number] Date of birth: 1985/12/19 Age: 31 y.o. Gender: female Date of Admission: 11/23/2016  Date of Discharge: 11/25/16 Admitting Physician: Uvaldo RisingKyle J Fletke, MD  Primary Care Provider: No PCP Per Patient but patient [facility]  Consultants: none  Indication for Hospitalization: Dehydration with elevated lactic acid  Discharge Diagnoses/Problem List:  Dehydration with elevated lactic acid HTN Alcohol misuse/abuse Melena Bipolar d/o Tobacco abuse  Disposition: Home   Discharge Condition: Stable, improved   Discharge Exam:  General: Sitting up in bed, resting comfortably HEENT: [place], AT. Moist mucous membranes Cardiovascular: RRR, no murmurs noted Respiratory: CTAB, normal effort on room air Abdomen: soft, mildly tender to palpation diffusely, nondistended, no masses, +bs Extremities: no edema. Cap refill < 3 sec. Normal ROM  Brief Hospital Course:  Patient presented with intractable nausea and vomiting for 1 day after having substantial amount of nausea and vomiting for New Year's Eve. She does not typically drink alcohol but drank enough that she did not recall the end of the night. On admission was found to have elevated lactic acid that trended up to 4.9 before returning to 1.5 during her hospital stay. Due to her vomiting she had some low potassium that was repleted. She symptomatically improved with IV hydration and was deemed stable for discharge. Of note, she had blood cultures drawn on admit due to the elevated lactic acid that grew coagulase negative staph in 1 out of 2 bottles, that was felt to be a contaminate.   Issues for Follow Up:  1. Potassium was 2.9 on discharge, had Kdur 40meq on day of discharge and was instructed to take Kdur for the next 2 days and have recheck at PCP on either 11/26/16 or  11/30/15.  Significant Procedures: none  Significant Labs and Imaging:   Recent Labs Lab 11/23/16 0710 11/23/16 1820  WBC 17.4* 17.6*  HGB 12.9 11.7*  HCT 38.2 34.6*  PLT 236 213    Recent Labs Lab 11/23/16 0710 11/23/16 1820 11/24/16 0452 11/25/16 0612  NA 137  --  139 136  K 3.4*  --  3.5 2.9*  CL 102  --  109 105  CO2 20*  --  24 24  GLUCOSE 156*  --  89 90  BUN 9  --  <5* 5*  CREATININE 1.07* 0.82 0.77 0.76  CALCIUM 9.5  --  8.6* 8.4*  ALKPHOS 56  --   --   --   AST 42*  --   --   --   ALT 20  --   --   --   ALBUMIN 4.4  --   --   --      Results/Tests Pending at Time of Discharge: none  Discharge Medications:  Allergies as of 11/25/2016      Reactions   Tomato Anaphylaxis, Itching      Medication List    STOP taking these medications   ciprofloxacin 500 MG tablet Commonly known as:  CIPRO   metoCLOPramide 10 MG tablet Commonly known as:  REGLAN     TAKE these medications   hydrochlorothiazide 25 MG tablet Commonly known as:  HYDRODIURIL Take 1 tablet (25 mg total) by mouth daily.   hydrOXYzine 50 MG tablet Commonly known as:  ATARAX/VISTARIL Take 50 mg by mouth 2 (two) times daily.   ibuprofen 600 MG tablet Commonly known as:  ADVIL,MOTRIN Take 1 tablet (600 mg total) by mouth every 6 (six) hours as needed.   nicotine 21 mg/24hr patch Commonly known as:  NICODERM CQ - dosed in mg/24 hours Place 1 patch (21 mg total) onto the skin daily.   OLANZapine 15 MG tablet Commonly known as:  ZYPREXA Take 15 mg by mouth at bedtime.   omeprazole 20 MG capsule Commonly known as:  PRILOSEC Take 1 capsule (20 mg total) by mouth daily.   ondansetron 4 MG disintegrating tablet Commonly known as:  ZOFRAN ODT Take 1 tablet (4 mg total) by mouth every 8 (eight) hours as needed for nausea or vomiting.   potassium chloride SA 20 MEQ tablet Commonly known as:  K-DUR,KLOR-CON Take 2 tablets for the first day, and then 1 tablet daily for the next 2  days.   promethazine 25 MG suppository Commonly known as:  PHENERGAN Place 1 suppository (25 mg total) rectally every 6 (six) hours as needed for nausea or vomiting.   promethazine 25 MG tablet Commonly known as:  PHENERGAN Take 1 tablet (25 mg total) by mouth every 6 (six) hours as needed for nausea or vomiting.   ranitidine 150 MG tablet Commonly known as:  ZANTAC Take 1 tablet (150 mg total) by mouth 2 (two) times daily.       Discharge Instructions: Please refer to Patient Instructions section of EMR for full details.  Patient was counseled important signs and symptoms that should prompt return to medical care, changes in medications, dietary instructions, activity restrictions, and follow up appointments.   Follow-Up Appointments: Follow-up Information    [facility]. Schedule an appointment as soon as possible for a visit on 11/29/2016.   Why:  Make an appointment for hospital followup to have your bloodwork checked on Friday 11/25/16 or Monday 11/29/16. Contact information: [address] [place] [address] [phone]          Leland Her, DO 11/25/2016, 9:14 AM PGY-1, [facility]

## 2016-11-25 NOTE — Progress Notes (Signed)
Pt discharged to home.  Discharge instructions explained to pt.  Pt has no questions at the time.  Pt states she has all belongings.  IV removed.  Pt ambulated off unit on her own.

## 2016-11-25 NOTE — Discharge Instructions (Signed)
You were admitted for dehydration, please stay well hydrated and you can slowly start eating as tolerated. Please start with bland easily digestible foods to start. You had some low potassium from vomiting so please take 2 tablets of the potassium pills this evening and then 1 tablet a day for the next 2 days. Please make sure you go to community health for a blood draw so that they can check your potassium on Friday 11/26/16 or Monday 11/29/16.  For your high blood pressure, please restart taking your home hydrochlorothiazide 25mg  daily.

## 2016-11-26 LAB — CULTURE, BLOOD (ROUTINE X 2)

## 2016-11-28 LAB — CULTURE, BLOOD (ROUTINE X 2): Culture: NO GROWTH

## 2017-03-21 ENCOUNTER — Encounter (HOSPITAL_COMMUNITY): Payer: Self-pay | Admitting: Emergency Medicine

## 2017-03-21 DIAGNOSIS — Z79899 Other long term (current) drug therapy: Secondary | ICD-10-CM | POA: Insufficient documentation

## 2017-03-21 DIAGNOSIS — R1084 Generalized abdominal pain: Secondary | ICD-10-CM | POA: Insufficient documentation

## 2017-03-21 DIAGNOSIS — F17210 Nicotine dependence, cigarettes, uncomplicated: Secondary | ICD-10-CM | POA: Insufficient documentation

## 2017-03-21 DIAGNOSIS — R197 Diarrhea, unspecified: Secondary | ICD-10-CM | POA: Insufficient documentation

## 2017-03-21 DIAGNOSIS — I10 Essential (primary) hypertension: Secondary | ICD-10-CM | POA: Insufficient documentation

## 2017-03-21 DIAGNOSIS — R112 Nausea with vomiting, unspecified: Secondary | ICD-10-CM | POA: Insufficient documentation

## 2017-03-21 LAB — CBC
HEMATOCRIT: 38.2 % (ref 36.0–46.0)
HEMOGLOBIN: 13 g/dL (ref 12.0–15.0)
MCH: 32.2 pg (ref 26.0–34.0)
MCHC: 34 g/dL (ref 30.0–36.0)
MCV: 94.6 fL (ref 78.0–100.0)
PLATELETS: 249 10*3/uL (ref 150–400)
RBC: 4.04 MIL/uL (ref 3.87–5.11)
RDW: 13.1 % (ref 11.5–15.5)
WBC: 12.7 10*3/uL — AB (ref 4.0–10.5)

## 2017-03-21 NOTE — ED Triage Notes (Signed)
Pt states she has had nausea, vomiting, and diarrhea since this morning  Pt states she has some abd pain also

## 2017-03-21 NOTE — ED Notes (Signed)
Called and no response

## 2017-03-22 ENCOUNTER — Encounter (HOSPITAL_COMMUNITY): Payer: Self-pay | Admitting: Emergency Medicine

## 2017-03-22 ENCOUNTER — Emergency Department (HOSPITAL_COMMUNITY)
Admission: EM | Admit: 2017-03-22 | Discharge: 2017-03-22 | Disposition: A | Discharge status: Home / Self Care | Payer: Self-pay | Attending: Emergency Medicine | Admitting: Emergency Medicine

## 2017-03-22 ENCOUNTER — Inpatient Hospital Stay (HOSPITAL_COMMUNITY)
Admission: EM | Admit: 2017-03-22 | Discharge: 2017-03-23 | DRG: 391 | Disposition: A | Discharge status: Home / Self Care | Payer: Self-pay | Attending: Family Medicine | Admitting: Family Medicine

## 2017-03-22 ENCOUNTER — Emergency Department (HOSPITAL_COMMUNITY): Payer: Self-pay

## 2017-03-22 DIAGNOSIS — F1290 Cannabis use, unspecified, uncomplicated: Secondary | ICD-10-CM | POA: Diagnosis present

## 2017-03-22 DIAGNOSIS — Z87898 Personal history of other specified conditions: Secondary | ICD-10-CM

## 2017-03-22 DIAGNOSIS — A084 Viral intestinal infection, unspecified: Principal | ICD-10-CM | POA: Diagnosis present

## 2017-03-22 DIAGNOSIS — G43A0 Cyclical vomiting, not intractable: Secondary | ICD-10-CM | POA: Diagnosis present

## 2017-03-22 DIAGNOSIS — K226 Gastro-esophageal laceration-hemorrhage syndrome: Secondary | ICD-10-CM | POA: Diagnosis present

## 2017-03-22 DIAGNOSIS — D72829 Elevated white blood cell count, unspecified: Secondary | ICD-10-CM | POA: Diagnosis present

## 2017-03-22 DIAGNOSIS — F17210 Nicotine dependence, cigarettes, uncomplicated: Secondary | ICD-10-CM | POA: Diagnosis present

## 2017-03-22 DIAGNOSIS — F419 Anxiety disorder, unspecified: Secondary | ICD-10-CM | POA: Diagnosis present

## 2017-03-22 DIAGNOSIS — K922 Gastrointestinal hemorrhage, unspecified: Secondary | ICD-10-CM

## 2017-03-22 DIAGNOSIS — R112 Nausea with vomiting, unspecified: Secondary | ICD-10-CM

## 2017-03-22 DIAGNOSIS — Z79899 Other long term (current) drug therapy: Secondary | ICD-10-CM

## 2017-03-22 DIAGNOSIS — Z8719 Personal history of other diseases of the digestive system: Secondary | ICD-10-CM

## 2017-03-22 DIAGNOSIS — F319 Bipolar disorder, unspecified: Secondary | ICD-10-CM | POA: Diagnosis present

## 2017-03-22 DIAGNOSIS — R1084 Generalized abdominal pain: Secondary | ICD-10-CM | POA: Diagnosis present

## 2017-03-22 DIAGNOSIS — I10 Essential (primary) hypertension: Secondary | ICD-10-CM | POA: Diagnosis present

## 2017-03-22 DIAGNOSIS — R195 Other fecal abnormalities: Secondary | ICD-10-CM | POA: Diagnosis present

## 2017-03-22 DIAGNOSIS — Z91018 Allergy to other foods: Secondary | ICD-10-CM

## 2017-03-22 DIAGNOSIS — R109 Unspecified abdominal pain: Secondary | ICD-10-CM | POA: Diagnosis present

## 2017-03-22 DIAGNOSIS — E876 Hypokalemia: Secondary | ICD-10-CM | POA: Diagnosis present

## 2017-03-22 DIAGNOSIS — R197 Diarrhea, unspecified: Secondary | ICD-10-CM

## 2017-03-22 DIAGNOSIS — T407X5A Adverse effect of cannabis (derivatives), initial encounter: Secondary | ICD-10-CM | POA: Diagnosis present

## 2017-03-22 DIAGNOSIS — F1011 Alcohol abuse, in remission: Secondary | ICD-10-CM | POA: Diagnosis present

## 2017-03-22 DIAGNOSIS — Z8659 Personal history of other mental and behavioral disorders: Secondary | ICD-10-CM

## 2017-03-22 LAB — RAPID URINE DRUG SCREEN, HOSP PERFORMED
Amphetamines: NOT DETECTED
BARBITURATES: NOT DETECTED
Benzodiazepines: NOT DETECTED
COCAINE: POSITIVE — AB
Opiates: NOT DETECTED
TETRAHYDROCANNABINOL: POSITIVE — AB

## 2017-03-22 LAB — COMPREHENSIVE METABOLIC PANEL
ALBUMIN: 4.9 g/dL (ref 3.5–5.0)
ALT: 16 U/L (ref 14–54)
ALT: 18 U/L (ref 14–54)
ANION GAP: 10 (ref 5–15)
AST: 28 U/L (ref 15–41)
AST: 31 U/L (ref 15–41)
Albumin: 4.4 g/dL (ref 3.5–5.0)
Alkaline Phosphatase: 74 U/L (ref 38–126)
Alkaline Phosphatase: 69 U/L (ref 38–126)
Anion gap: 13 (ref 5–15)
BILIRUBIN TOTAL: 0.5 mg/dL (ref 0.3–1.2)
BUN: 7 mg/dL (ref 6–20)
BUN: 5 mg/dL — AB (ref 6–20)
CHLORIDE: 106 mmol/L (ref 101–111)
CHLORIDE: 98 mmol/L — AB (ref 101–111)
CO2: 24 mmol/L (ref 22–32)
CO2: 23 mmol/L (ref 22–32)
Calcium: 9.8 mg/dL (ref 8.9–10.3)
Calcium: 9.5 mg/dL (ref 8.9–10.3)
Creatinine, Ser: 0.84 mg/dL (ref 0.44–1.00)
Creatinine, Ser: 0.85 mg/dL (ref 0.44–1.00)
GFR calc Af Amer: >60 mL/min (ref >60)
GFR calc non Af Amer: >60 mL/min (ref >60)
GLUCOSE: 126 mg/dL — AB (ref 65–99)
Glucose, Bld: 128 mg/dL — ABNORMAL HIGH (ref 65–99)
POTASSIUM: 3.2 mmol/L — AB (ref 3.5–5.1)
Potassium: 3.7 mmol/L (ref 3.5–5.1)
SODIUM: 140 mmol/L (ref 135–145)
SODIUM: 134 mmol/L — AB (ref 135–145)
TOTAL PROTEIN: 8.7 g/dL — AB (ref 6.5–8.1)
Total Bilirubin: 0.7 mg/dL (ref 0.3–1.2)
Total Protein: 7.9 g/dL (ref 6.5–8.1)

## 2017-03-22 LAB — URINALYSIS, ROUTINE W REFLEX MICROSCOPIC
BILIRUBIN URINE: NEGATIVE
GLUCOSE, UA: NEGATIVE mg/dL
KETONES UR: NEGATIVE mg/dL
NITRITE: NEGATIVE
PH: 5 (ref 5.0–8.0)
Protein, ur: 30 mg/dL — AB
Specific Gravity, Urine: 1.024 (ref 1.005–1.030)

## 2017-03-22 LAB — CBC WITH DIFFERENTIAL/PLATELET
Basophils Absolute: 0 10*3/uL (ref 0.0–0.1)
Basophils Relative: 0 %
Eosinophils Absolute: 0 10*3/uL (ref 0.0–0.7)
Eosinophils Relative: 0 %
HEMATOCRIT: 36.4 % (ref 36.0–46.0)
Hemoglobin: 12.1 g/dL (ref 12.0–15.0)
LYMPHS PCT: 12 %
Lymphs Abs: 1.8 10*3/uL (ref 0.7–4.0)
MCH: 30.9 pg (ref 26.0–34.0)
MCHC: 33.2 g/dL (ref 30.0–36.0)
MCV: 92.9 fL (ref 78.0–100.0)
Monocytes Absolute: 0.4 10*3/uL (ref 0.1–1.0)
Monocytes Relative: 3 %
NEUTROS ABS: 13.4 10*3/uL — AB (ref 1.7–7.7)
Neutrophils Relative %: 85 %
Platelets: 216 10*3/uL (ref 150–400)
RBC: 3.92 MIL/uL (ref 3.87–5.11)
RDW: 13 % (ref 11.5–15.5)
WBC: 15.7 10*3/uL — AB (ref 4.0–10.5)

## 2017-03-22 LAB — CBC
HEMATOCRIT: 36.9 % (ref 36.0–46.0)
HEMOGLOBIN: 12.2 g/dL (ref 12.0–15.0)
MCH: 30.7 pg (ref 26.0–34.0)
MCHC: 33.1 g/dL (ref 30.0–36.0)
MCV: 92.9 fL (ref 78.0–100.0)
PLATELETS: 220 10*3/uL (ref 150–400)
RBC: 3.97 MIL/uL (ref 3.87–5.11)
RDW: 12.9 % (ref 11.5–15.5)
WBC: 18.9 10*3/uL — AB (ref 4.0–10.5)

## 2017-03-22 LAB — LIPASE, BLOOD
LIPASE: 19 U/L (ref 11–51)
Lipase: 18 U/L (ref 11–51)

## 2017-03-22 LAB — MAGNESIUM: Magnesium: 1.5 mg/dL — ABNORMAL LOW (ref 1.7–2.4)

## 2017-03-22 LAB — PHOSPHORUS: Phosphorus: 2.6 mg/dL (ref 2.5–4.6)

## 2017-03-22 LAB — ETHANOL

## 2017-03-22 LAB — HCG, QUANTITATIVE, PREGNANCY: hCG, Beta Chain, Quant, S: <1 m[IU]/mL (ref <5)

## 2017-03-22 LAB — POC OCCULT BLOOD, ED: FECAL OCCULT BLD: POSITIVE — AB

## 2017-03-22 MED ORDER — ACETAMINOPHEN 650 MG RE SUPP: 650.0000 mg | Freq: Four times a day (QID) | RECTAL | Status: DC | PRN

## 2017-03-22 MED ORDER — PROMETHAZINE HCL 25 MG/ML IJ SOLN
12.5000 mg | Freq: Once | INTRAMUSCULAR | Status: AC
  Administered 2017-03-22: 12.5 mg via INTRAVENOUS
  Filled 2017-03-22: qty 1

## 2017-03-22 MED ORDER — HALOPERIDOL LACTATE 5 MG/ML IJ SOLN
2.0000 mg | Freq: Once | INTRAMUSCULAR | Status: AC
  Administered 2017-03-22: 2 mg via INTRAVENOUS
  Filled 2017-03-22: qty 1

## 2017-03-22 MED ORDER — SODIUM CHLORIDE 0.9 % IV SOLN
INTRAVENOUS | Status: DC
  Administered 2017-03-22 (×2): via INTRAVENOUS

## 2017-03-22 MED ORDER — NICOTINE 7 MG/24HR TD PT24
7.0000 mg | MEDICATED_PATCH | Freq: Every day | TRANSDERMAL | Status: DC
  Administered 2017-03-22 – 2017-03-23 (×2): 7 mg via TRANSDERMAL
  Filled 2017-03-22 (×2): qty 1

## 2017-03-22 MED ORDER — HYDROXYZINE HCL 25 MG PO TABS
50.0000 mg | ORAL_TABLET | Freq: Two times a day (BID) | ORAL | Status: DC
  Administered 2017-03-22 – 2017-03-23 (×3): 50 mg via ORAL
  Filled 2017-03-22 (×3): qty 2

## 2017-03-22 MED ORDER — PROMETHAZINE HCL 25 MG RE SUPP: 25.0000 mg | Freq: Four times a day (QID) | RECTAL | 0 refills | Status: DC | PRN

## 2017-03-22 MED ORDER — LORAZEPAM 1 MG PO TABS
1.0000 mg | ORAL_TABLET | Freq: Four times a day (QID) | ORAL | Status: DC | PRN
  Filled 2017-03-22: qty 1

## 2017-03-22 MED ORDER — PROMETHAZINE HCL 25 MG PO TABS
12.5000 mg | ORAL_TABLET | Freq: Four times a day (QID) | ORAL | Status: DC | PRN
  Filled 2017-03-22: qty 1

## 2017-03-22 MED ORDER — SODIUM CHLORIDE 0.9% FLUSH
3.0000 mL | Freq: Two times a day (BID) | INTRAVENOUS | Status: DC
  Administered 2017-03-22 – 2017-03-23 (×2): 3 mL via INTRAVENOUS

## 2017-03-22 MED ORDER — ONDANSETRON 4 MG PO TBDP
4.0000 mg | ORAL_TABLET | Freq: Once | ORAL | Status: AC | PRN
  Administered 2017-03-22: 4 mg via ORAL
  Filled 2017-03-22: qty 1

## 2017-03-22 MED ORDER — SODIUM CHLORIDE 0.9 % IV BOLUS (SEPSIS)
1000.0000 mL | Freq: Once | INTRAVENOUS | Status: AC
  Administered 2017-03-22: 1000 mL via INTRAVENOUS

## 2017-03-22 MED ORDER — METOCLOPRAMIDE HCL 5 MG/ML IJ SOLN
10.0000 mg | Freq: Once | INTRAMUSCULAR | Status: AC
  Administered 2017-03-22: 10 mg via INTRAVENOUS
  Filled 2017-03-22: qty 2

## 2017-03-22 MED ORDER — PROMETHAZINE HCL 25 MG PO TABS: 25.0000 mg | ORAL_TABLET | Freq: Four times a day (QID) | ORAL | 0 refills | Status: DC | PRN

## 2017-03-22 MED ORDER — FOLIC ACID 1 MG PO TABS
1.0000 mg | ORAL_TABLET | Freq: Every day | ORAL | Status: DC
  Administered 2017-03-23: 1 mg via ORAL
  Filled 2017-03-22 (×2): qty 1

## 2017-03-22 MED ORDER — ONDANSETRON HCL 4 MG/2ML IJ SOLN
4.0000 mg | Freq: Once | INTRAMUSCULAR | Status: AC
  Administered 2017-03-22: 4 mg via INTRAVENOUS
  Filled 2017-03-22: qty 2

## 2017-03-22 MED ORDER — MAGNESIUM SULFATE 2 GM/50ML IV SOLN
2.0000 g | Freq: Once | INTRAVENOUS | Status: AC
  Administered 2017-03-22: 2 g via INTRAVENOUS
  Filled 2017-03-22: qty 50

## 2017-03-22 MED ORDER — ACETAMINOPHEN 325 MG PO TABS: 650.0000 mg | ORAL_TABLET | Freq: Four times a day (QID) | ORAL | Status: DC | PRN

## 2017-03-22 MED ORDER — RANITIDINE HCL 150 MG PO TABS: 150.0000 mg | ORAL_TABLET | Freq: Two times a day (BID) | ORAL | 0 refills | Status: DC

## 2017-03-22 MED ORDER — ACETAMINOPHEN 325 MG PO TABS
650.0000 mg | ORAL_TABLET | Freq: Once | ORAL | Status: AC
  Administered 2017-03-22: 650 mg via ORAL
  Filled 2017-03-22: qty 2

## 2017-03-22 MED ORDER — FAMOTIDINE IN NACL 20-0.9 MG/50ML-% IV SOLN
20.0000 mg | Freq: Once | INTRAVENOUS | Status: AC
  Administered 2017-03-22: 20 mg via INTRAVENOUS
  Filled 2017-03-22: qty 50

## 2017-03-22 MED ORDER — HYDRALAZINE HCL 20 MG/ML IJ SOLN: 5.0000 mg | INTRAMUSCULAR | Status: DC | PRN

## 2017-03-22 MED ORDER — SODIUM CHLORIDE 0.9 % IV SOLN
30.0000 meq | Freq: Once | INTRAVENOUS | Status: AC
  Administered 2017-03-23: 30 meq via INTRAVENOUS
  Filled 2017-03-22 (×2): qty 15

## 2017-03-22 MED ORDER — HYDROCHLOROTHIAZIDE 12.5 MG PO CAPS
25.0000 mg | ORAL_CAPSULE | Freq: Once | ORAL | Status: AC
  Administered 2017-03-22: 25 mg via ORAL
  Filled 2017-03-22: qty 2

## 2017-03-22 MED ORDER — LORAZEPAM 2 MG/ML IJ SOLN
1.0000 mg | Freq: Four times a day (QID) | INTRAMUSCULAR | Status: DC | PRN
  Administered 2017-03-23: 1 mg via INTRAVENOUS
  Filled 2017-03-22: qty 1

## 2017-03-22 MED ORDER — PANTOPRAZOLE SODIUM 40 MG IV SOLR
40.0000 mg | Freq: Two times a day (BID) | INTRAVENOUS | Status: DC
  Administered 2017-03-22 – 2017-03-23 (×3): 40 mg via INTRAVENOUS
  Filled 2017-03-22 (×3): qty 40

## 2017-03-22 MED ORDER — OMEPRAZOLE 20 MG PO CPDR: 20.0000 mg | DELAYED_RELEASE_CAPSULE | Freq: Every day | ORAL | 0 refills | Status: DC

## 2017-03-22 MED ORDER — HYDRALAZINE HCL 20 MG/ML IJ SOLN
10.0000 mg | Freq: Once | INTRAMUSCULAR | Status: AC
  Administered 2017-03-22: 10 mg via INTRAVENOUS
  Filled 2017-03-22: qty 1

## 2017-03-22 MED ORDER — MORPHINE SULFATE (PF) 4 MG/ML IV SOLN
4.0000 mg | Freq: Once | INTRAVENOUS | Status: AC
  Administered 2017-03-22: 4 mg via INTRAVENOUS
  Filled 2017-03-22: qty 1

## 2017-03-22 MED ORDER — THIAMINE HCL 100 MG/ML IJ SOLN: 100.0000 mg | Freq: Every day | INTRAMUSCULAR | Status: DC

## 2017-03-22 MED ORDER — ADULT MULTIVITAMIN W/MINERALS CH
1.0000 | ORAL_TABLET | Freq: Every day | ORAL | Status: DC
  Administered 2017-03-23: 1 via ORAL
  Filled 2017-03-22 (×2): qty 1

## 2017-03-22 MED ORDER — VITAMIN B-1 100 MG PO TABS
100.0000 mg | ORAL_TABLET | Freq: Every day | ORAL | Status: DC
  Administered 2017-03-23: 100 mg via ORAL
  Filled 2017-03-22 (×2): qty 1

## 2017-03-22 MED ORDER — OLANZAPINE 7.5 MG PO TABS
15.0000 mg | ORAL_TABLET | Freq: Every day | ORAL | Status: DC
  Administered 2017-03-22: 15 mg via ORAL
  Filled 2017-03-22 (×2): qty 2

## 2017-03-22 MED ORDER — DEXTROSE-NACL 5-0.45 % IV SOLN
INTRAVENOUS | Status: DC
  Administered 2017-03-22 – 2017-03-23 (×2): via INTRAVENOUS

## 2017-03-22 MED ORDER — IOPAMIDOL (ISOVUE-300) INJECTION 61%
INTRAVENOUS | Status: AC
  Administered 2017-03-22: 75 mL
  Filled 2017-03-22: qty 100
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
## 2017-03-22 NOTE — ED Notes (Signed)
Patient transported to CT 

## 2017-03-22 NOTE — ED Notes (Signed)
Report attempted 
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
## 2017-03-22 NOTE — ED Triage Notes (Signed)
Pt reports seen at [facility] earlier this am, given phenergan which she states she got filled and took but vomiting and abdominal pain have not subsided.

## 2017-03-22 NOTE — ED Notes (Signed)
Pt calls out stating she is throwing up blood.

## 2017-03-22 NOTE — ED Notes (Signed)
Pt states that she had been having N/V/D x 1 day

## 2017-03-22 NOTE — Discharge Instructions (Signed)
1. Medications: zantac, omeprazole, phenergan, usual home medications 2. Treatment: rest, drink plenty of fluids, advance diet slowly; stop smoking marijuana 3. Follow Up: Please followup with your primary doctor in 2 days for discussion of your diagnoses and further evaluation after today's visit; if you do not have a primary care doctor use the resource guide provided to find one; Please return to the ER for persistent vomiting, high fevers or worsening symptoms

## 2017-03-23 ENCOUNTER — Encounter (HOSPITAL_COMMUNITY): Payer: Self-pay | Admitting: General Practice

## 2017-03-23 DIAGNOSIS — R1084 Generalized abdominal pain: Secondary | ICD-10-CM

## 2017-03-23 DIAGNOSIS — R112 Nausea with vomiting, unspecified: Secondary | ICD-10-CM

## 2017-03-23 DIAGNOSIS — K922 Gastrointestinal hemorrhage, unspecified: Secondary | ICD-10-CM

## 2017-03-23 LAB — COMPREHENSIVE METABOLIC PANEL
ALT: 14 U/L (ref 14–54)
ANION GAP: 9 (ref 5–15)
AST: 23 U/L (ref 15–41)
Albumin: 3.7 g/dL (ref 3.5–5.0)
Alkaline Phosphatase: 56 U/L (ref 38–126)
BUN: <5 mg/dL — ABNORMAL LOW (ref 6–20)
CHLORIDE: 101 mmol/L (ref 101–111)
CO2: 25 mmol/L (ref 22–32)
Calcium: 8.7 mg/dL — ABNORMAL LOW (ref 8.9–10.3)
Creatinine, Ser: 0.74 mg/dL (ref 0.44–1.00)
GFR calc non Af Amer: >60 mL/min (ref >60)
Glucose, Bld: 112 mg/dL — ABNORMAL HIGH (ref 65–99)
Potassium: 2.8 mmol/L — ABNORMAL LOW (ref 3.5–5.1)
SODIUM: 135 mmol/L (ref 135–145)
Total Bilirubin: 0.5 mg/dL (ref 0.3–1.2)
Total Protein: 6.7 g/dL (ref 6.5–8.1)

## 2017-03-23 LAB — CBC
HCT: 36 % (ref 36.0–46.0)
HEMOGLOBIN: 11.9 g/dL — AB (ref 12.0–15.0)
MCH: 30.5 pg (ref 26.0–34.0)
MCHC: 33.1 g/dL (ref 30.0–36.0)
MCV: 92.3 fL (ref 78.0–100.0)
Platelets: 235 10*3/uL (ref 150–400)
RBC: 3.9 MIL/uL (ref 3.87–5.11)
RDW: 13.1 % (ref 11.5–15.5)
WBC: 14.9 10*3/uL — ABNORMAL HIGH (ref 4.0–10.5)

## 2017-03-23 LAB — HEMOGLOBIN A1C
Hgb A1c MFr Bld: 5.2 % (ref 4.8–5.6)
MEAN PLASMA GLUCOSE: 103 mg/dL

## 2017-03-23 LAB — HIV ANTIBODY (ROUTINE TESTING W REFLEX): HIV SCREEN 4TH GENERATION: NONREACTIVE

## 2017-03-23 MED ORDER — POTASSIUM CHLORIDE CRYS ER 20 MEQ PO TBCR
40.0000 meq | EXTENDED_RELEASE_TABLET | ORAL | Status: AC
  Administered 2017-03-23 (×2): 40 meq via ORAL
  Filled 2017-03-23: qty 2

## 2017-03-23 NOTE — Progress Notes (Signed)
CM received consult: Patient does not have insurance or a PCP. CM spoke and provide pt with Taylor Bartlett information. Pt to f/u prior to d/c in obtaining post Bartlett visit [name] PCP appointment . CM to f/u with pt . Gae Gallop RN,BSN, CM

## 2017-03-23 NOTE — Progress Notes (Signed)
Family Medicine Teaching Service Daily Progress Note Intern Pager: [phone]  Patient name: Taylor Bartlett Medical record number: [id number] Date of birth: 05-Nov-1986 Age: 31 y.o. Gender: female  Primary Care Provider: No PCP Per Patient Consultants: GI Code Status: FULL   Pt Overview and Major Events to Date:  Admit 5/1  Assessment and Plan:  Taylor Bartlett is a 31 y.o. female presenting with abdominal pain x 1 day. PMH is significant for bipolar affective disorder with depression, HTN, anxiety, tobacco use, and polysubstance abuse.     Abdominal pain/nausea/vomiting/diarrhea  Improved. Symptoms of nausea/vomiting likely 2/2 cannabis hyperemesis syndrome however can also consider viral gastroenteritis vs cyclic vomiting syndrome vs Mallory Weiss tears 2/2 vomiting episodes.  HIV negative, A1c 5.2. Alcohol level <5.  -GI consulted in ED, appreciate recs.  Per GI recs, hematemesis likely due to Mallory-Weiss tear.  Given improvement in n/v with antiemetics and PPI, can advance diet as tolerated.  -Patient currently NPO >> ADAT.  -FOBT emesis pending  -D5 1/2 NS @ 125 cc/hr -Continue IV protonix  -Tylenol prn -continuous cardiac monitoring  -vitals per unit routine   HTN.  BP on admission 175/104.  Per chart, on HCTZ 25 mg but reported as not taking.  Hydralazine 10 mg one time administered and  Hydralazine with parameters ordered: 5 mg for SBP >160 or DBP >110.  BP stable this AM at 126/88.   -Monitor blood pressures   Hypokalemia On arrival with K+ of 3.2.  K-run 30 mEq ordered on admission but was not administered until 12 hours later.  Mag low at 1.5 and phos 2.6. K this AM 2.8.  -additional k-dur 40 mEq x2 given and mag repleted  -Recheck BMET in AM  History of tobacco abuse  Current smoker, smokes about 5 cigarettes a day.  Requesting a nicotine patch.   -Nicotine 7 mg transdermal patch ordered   History of polysubstance abuse  UDS + for cocaine and THC.   Patient admits to recreational [facility] use however denies cocaine use.  Last drink 4-5 months ago but given the discrepancy in subjective drug use history, will place on CIWA protocol.   -monitor CIWA - scores have been 8, 8, 8, 5, 3 overnight (higher scores for paroxysmal sweats and tremor).    Anxiety  Stable.  At home on Hydroxyzine 50 mg BID  -Continue home Hydroxyzine    History of bipolar affective disorder with depression Stable.  At home on Zyprexa 15 mg daily at bedtime.  -Continue home Zyprexa  Social -No PCP per chart and patient with no insurance  -CM consult   FEN/GI: NPO, D5 1/2 NS @ 125 cc.hr Prophylaxis: SCDs in setting of possible active bleed    Disposition: Likely discharge home later today if continues to feel well.   Subjective:  Patient feels much improved this morning.  Denies nausea, vomiting.  No further episodes of diarrhea.  Is hungry.   Objective: Temp:  [98.1 F (36.7 C)-98.4 F (36.9 C)] 98.1 F (36.7 C) (05/02 0451) Pulse Rate:  [71-83] 83 (05/02 0451) Resp:  [16] 16 (05/02 0451) BP: (126-172)/(85-98) 126/88 (05/02 0451) SpO2:  [97 %-100 %] 97 % (05/02 0451) Weight:  [148 lb 5.9 oz (67.3 kg)] 148 lb 5.9 oz (67.3 kg) (05/01 1700)   Physical Exam: General: 31 yo F, laying in hospital bed, NAD  Cardiovascular: RRR no MRG Respiratory: CTA B/L with no wheezes, rhonchi or rales noted  Gastrointestinal: soft, NTND, +bs  MSK: normal ROM  Derm: warm, dry   Laboratory:  Recent Labs Lab 03/22/17 1006 03/22/17 1828 03/23/17 0530  WBC 15.7* 18.9* 14.9*  HGB 12.1 12.2 11.9*  HCT 36.4 36.9 36.0  PLT 216 220 235    Recent Labs Lab 03/21/17 2327 03/22/17 1006 03/23/17 0530  NA 140 134* 135  K 3.7 3.2* 2.8*  CL 106 98* 101  CO2 BUN 7 5* <5*  CREATININE 0.84 0.85 0.74  CALCIUM 9.8 9.5 8.7*  PROT 8.7* 7.9 6.7  BILITOT 0.5 0.7 0.5  ALKPHOS 74 69 56  ALT AST GLUCOSE 126* 128* 112*   Imaging/Diagnostic  Tests: No results found.   Freddrick March, MD 03/23/2017, 3:09 PM PGY-1, [facility] FPTS Intern pager: [phone], text pages welcome

## 2017-03-23 NOTE — Progress Notes (Signed)
[facility] PROGRESS NOTE Subjective patient somewhat angry because he has not gotten anything to eat. She says she's not having any further nausea. She states to me that every time she gets sick and has one of these vomiting episodes she sometimes puts up a small amount of blood. She came to the emergency room and her drug screen was positive for cocaine and THC.  Objective: Vital signs in last 24 hours: Temp:  [98.1 F (36.7 C)-98.4 F (36.9 C)] 98.1 F (36.7 C) (05/02 0451) Pulse Rate:  [71-87] 83 (05/02 0451) Resp:  [16] 16 (05/02 0451) BP: (126-172)/(85-106) 126/88 (05/02 0451) SpO2:  [97 %-100 %] 97 % (05/02 0451) Weight:  [67.3 kg (148 lb 5.9 oz)] 67.3 kg (148 lb 5.9 oz) (05/01 1700) Last BM Date: 03/21/17  Intake/Output from previous day: 05/01 0701 - 05/02 0700 In: 4157.1 [P.O.:240; I.V.:2602.1; IV Piggyback:1315] Out: 500 [Urine:500] Intake/Output this shift: Total I/O In: 3 [I.V.:3] Out: -   PE: General-- patient and no distress does appear to be somewhat agitated  Abdomen-- nontender  Lab Results:  Recent Labs  03/21/17 2327 03/22/17 1006 03/22/17 1828 03/23/17 0530  WBC 12.7* 15.7* 18.9* 14.9*  HGB 13.0 12.1 12.2 11.9*  HCT 38.2 36.4 36.9 36.0  PLT 249 216 220 235   BMET  Recent Labs  03/21/17 2327 03/22/17 1006 03/23/17 0530  NA 140 134* 135  K 3.7 3.2* 2.8*  CL 106 98* 101  CO2 CREATININE 0.84 0.85 0.74   LFT  Recent Labs  03/21/17 2327 03/22/17 1006 03/23/17 0530  PROT 8.7* 7.9 6.7  AST ALT ALKPHOS 74 69 56  BILITOT 0.5 0.7 0.5   PT/INR No results for input(s): LABPROT, INR in the last 72 hours. PANCREAS  Recent Labs  03/21/17 2327 03/22/17 1006  LIPASE 18 19         Studies/Results: Ct Abdomen Pelvis W Contrast  Result Date: 03/22/2017 CLINICAL DATA:  Abdominal pain and vomiting for 2 days EXAM: CT ABDOMEN AND PELVIS WITH CONTRAST TECHNIQUE: Multidetector CT imaging of the  abdomen and pelvis was performed using the standard protocol following bolus administration of intravenous contrast. CONTRAST:  75mL ISOVUE-300 IOPAMIDOL (ISOVUE-300) INJECTION 61% COMPARISON:  11/23/2016 FINDINGS: Lower chest: No acute abnormality. Hepatobiliary: No focal liver abnormality is seen. No gallstones, gallbladder wall thickening, or biliary dilatation. Pancreas: Unremarkable. No pancreatic ductal dilatation or surrounding inflammatory changes. Spleen: Normal in size without focal abnormality. Adrenals/Urinary Tract: Adrenal glands are unremarkable. Kidneys are normal, without renal calculi, focal lesion, or hydronephrosis. Bladder is unremarkable. Stomach/Bowel: Stomach is within normal limits. Appendix appears normal. No evidence of bowel wall thickening, distention, or inflammatory changes. Vascular/Lymphatic: No significant vascular findings are present. No enlarged abdominal or pelvic lymph nodes. Reproductive: Uterus and bilateral adnexa are unremarkable. Other: No abdominal wall hernia or abnormality. No abdominopelvic ascites. Musculoskeletal: No acute or significant osseous findings. IMPRESSION: No acute abnormality noted. Electronically Signed   By: Alcide Clever M.D.   On: 03/22/2017 12:24    Medications: I have reviewed the patient's current medications.  Assessment/Plan: 1. Hematemesis. Probably due to Mallory-Weiss tear. Patient seems better with PPI. She has chronic nausea and vomiting probably due to drug use. She seems somewhat agitated this morning that she's not getting anything to eat. Who will therefore go ahead and start her own clear liquids and see how she does.   Leyli Kevorkian JR,Oseas Detty L 03/23/2017, 10:38 AM  This  note was created using voice recognition software. Minor errors may Have occurred unintentionally.  Pager: [phone] If no answer or after hours call [phone]

## 2017-03-23 NOTE — Discharge Instructions (Signed)
Gastrointestinal Bleeding Gastrointestinal bleeding is bleeding somewhere along the path food travels through the body (digestive tract). This path is anywhere between the mouth and the opening of the butt (anus). You may have blood in your poop (stools) or have black poop. If you throw up (vomit), there may be blood in it. This condition can be mild, serious, or even life-threatening. If you have a lot of bleeding, you may need to stay in the hospital. Follow these instructions at home:  Take over-the-counter and prescription medicines only as told by your doctor.  Eat foods that have a lot of fiber in them. These foods include whole grains, fruits, and vegetables. You can also try eating 1-3 prunes each day.  Drink enough fluid to keep your pee (urine) clear or pale yellow.  Keep all follow-up visits as told by your doctor. This is important. Contact a doctor if:  Your symptoms do not get better. Get help right away if:  Your bleeding gets worse.  You feel dizzy or you pass out (faint).  You feel weak.  You have very bad cramps in your back or belly (abdomen).  You pass large clumps of blood (clots) in your poop.  Your symptoms are getting worse. This information is not intended to replace advice given to you by your health care provider. Make sure you discuss any questions you have with your health care provider. Document Released: 08/17/2008 Document Revised: 04/15/2016 Document Reviewed: 04/28/2015 Elsevier Interactive Patient Education  2017 [employer] Inc[employer]   Gastrointestinal Bleeding Gastrointestinal (GI) bleeding is bleeding somewhere along the digestive tract, between the mouth and anus. This can be caused by various problems. The severity of these problems can range from mild to serious or even life-threatening. If you have GI bleeding, you may find blood in your stools (feces), you may have black stools, or you may vomit blood. If there is a lot of bleeding, you may need  to stay in the hospital. What are the causes? This condition may be caused by:  Esophagitis. This is inflammation, irritation, or swelling of the esophagus.  Hemorrhoids.These are swollen veins in the rectum.  Anal fissures.These are areas of painful tearing that are often caused by passing hard stool.  Diverticulosis.These are pouches that form on the colon over time, with age, and may bleed a lot.  Diverticulitis.This is inflammation in areas with diverticulosis. It can cause pain, fever, and bloody stools, although bleeding may be mild.  Polyps and cancer. Colon cancer often starts out as precancerous polyps.  Gastritis and ulcers. With these, bleeding may come from the upper GI tract, near the stomach. What are the signs or symptoms? Symptoms of this condition may include:  Bright red blood in your vomit, or vomit that looks like coffee grounds.  Bloody, black, or tarry stools.  Bleeding from the lower GI tract will usually cause red or maroon blood in the stools.  Bleeding from the upper GI tract may cause black, tarry, often bad-smelling stools.  In certain cases, if the bleeding is fast enough, the stools may be red.  Pain or cramping in the abdomen. How is this diagnosed? This condition may be diagnosed based on:  Medical history and physical exam.  Various tests, such as:  Blood tests.  X-rays and other imaging tests.  Esophagogastroduodenoscopy (EGD). In this test, a flexible, lighted tube is used to look at your esophagus, stomach, and small intestine.  Colonoscopy. In this test, a flexible, lighted tube is used to look  at your colon. How is this treated? Treatment for this condition depends on the cause of the bleeding. For example:  For bleeding from the esophagus, stomach, small intestine, or colon, the health care provider doing your EGD or colonoscopy may be able to stop the bleeding as part of the procedure.  Inflammation or infection of the  colon can be treated with medicines.  Certain rectal problems can be treated with creams, suppositories, or warm baths.  Surgery is sometimes needed.  Blood transfusions are sometimes needed if a lot of blood has been lost. If bleeding is slow, you may be allowed to go home. If there is a lot of bleeding, you will need to stay in the hospital for observation. Follow these instructions at home:  Take over-the-counter and prescription medicines only as told by your health care provider.  Eat foods that are high in fiber. This will help to keep your stools soft. These foods include whole grains, legumes, fruits, and vegetables. Eating 1-3 prunes each day works well for many people.  Drink enough fluid to keep your urine clear or pale yellow.  Keep all follow-up visits as told by your health care provider. This is important. Contact a health care provider if:  Your symptoms do not improve. Get help right away if:  Your bleeding increases.  You feel light-headed or you faint.  You feel weak.  You have severe cramps in your back or abdomen.  You pass large blood clots in your stool.  Your symptoms are getting worse. This information is not intended to replace advice given to you by your health care provider. Make sure you discuss any questions you have with your health care provider. Document Released: 11/05/2000 Document Revised: 04/07/2016 Document Reviewed: 04/28/2015 Elsevier Interactive Patient Education  2017 [employer].

## 2017-03-25 NOTE — Discharge Summary (Signed)
Family Medicine [facility] Discharge Summary  Patient name: Taylor Julyanque S Tumolo Medical record number: [id number] Date of birth: [birth year] Age: 31 y.o. Gender: female Date of Admission: 03/22/2017  Date of Discharge: 03/23/2017  Admitting Physician: Doreene ElandKehinde T Eniola, MD  Primary Care Provider: Patient, No Pcp Per Consultants: GI   Indication for Hospitalization: Abdominal pain, nausea, vomiting   Discharge Diagnoses/Problem List:  Abdominal pain Nausea Vomiting HTN Hypokalemia History of tobacco use History of THC use, cocaine use Anxiety History of bipolar affective disorder with depression   Disposition: Home   Discharge Condition: Stable, improved   Discharge Exam:  General: 31 yo F, laying in hospital bed, NAD  Cardiovascular: RRR no MRG Respiratory: CTA B/L with no wheezes, rhonchi or rales noted  Gastrointestinal: soft, NTND, +bs  MSK: normal ROM  Derm: warm, dry   Brief Hospital Course:  [facility] with symptoms of abdominal pain, nausea, vomiting and diarrhea in addition to  complaints of bloody emesis. She reported having episodes similar to this one every couple of months for past two years. [facility] for same symptoms and at that time, UPreg and CTAP were negative. [facility] for continued symptoms. Labs at that time showed a small drop in Hgb and bloody emesis was witnessed in the ED however unsure if it was contents of what she had eaten vs blood. UDS +cocaine and THC however patient denied use of cocaine.  Noted to have hypokalemia on admission as well as low Magnesium level and both were repleted.  She was placed on CIWA protocol given history of alcohol use and discrepancies in history of recent drug use and UDS findings. Blood pressures were also noted to be elevated (170s/100s) so she was admitted for workup due to concern for GI bleeding.  During admission, Protonix was started, and  elevated blood pressures were treated with prn Hydralazine. Home Zyprexa and Hydroxyzine were continued. GI was consulted and suggested symptoms were likely due to cannabis induced cyclic vomiting syndrome or viral gastritis with blood in emesis due to gastritis vs small Mallory-Weiss tears. Patient was kept NPO for the evening in case needing procedure.  No further episodes of emesis noted.  Nausea improved with Phenergan and pain improved with Tylenol. Discussed with patient that this may be due to [facility] use however patient does not believe this could be the cause.  The following morning she reported her symptoms had resolved.  Diet was advanced as tolerated and she was discharged home in stable condition.    Issues for Follow Up:  1. Follow up abdominal pain, nausea, vomiting symptoms for resolution.  2. Check K+ with BMET and replete if necessary.   Significant Procedures: None  Significant Labs and Imaging:   Recent Labs Lab 03/22/17 1006 03/22/17 1828 03/23/17 0530  WBC 15.7* 18.9* 14.9*  HGB 12.1 12.2 11.9*  HCT 36.4 36.9 36.0  PLT 216 220 235    Recent Labs Lab 03/21/17 2327 03/22/17 1006 03/22/17 1828 03/23/17 0530  NA 140 134*  --  135  K 3.7 3.2*  --  2.8*  CL 106 98*  --  101  CO2 24 23  --  25  GLUCOSE 126* 128*  --  112*  BUN 7 5*  --  <5*  CREATININE 0.84 0.85  --  0.74  CALCIUM 9.8 9.5  --  8.7*  MG  --   --  1.5*  --   PHOS  --   --  2.6  --   ALKPHOS 74 69  --  56  AST 28 31  --  23  ALT 16 18  --  14  ALBUMIN 4.9 4.4  --  3.7    Results/Tests Pending at Time of Discharge: None  Discharge Medications:  Allergies as of 03/23/2017      Reactions   Tomato Anaphylaxis, Itching      Medication List    TAKE these medications   hydrochlorothiazide 25 MG tablet Commonly known as:  HYDRODIURIL Take 1 tablet (25 mg total) by mouth daily.   hydrOXYzine 50 MG tablet Commonly known as:  ATARAX/VISTARIL Take 50 mg by mouth 2 (two) times daily.    OLANZapine 15 MG tablet Commonly known as:  ZYPREXA Take 15 mg by mouth at bedtime.   omeprazole 20 MG capsule Commonly known as:  PRILOSEC Take 1 capsule (20 mg total) by mouth daily.   promethazine 25 MG suppository Commonly known as:  PHENERGAN Place 1 suppository (25 mg total) rectally every 6 (six) hours as needed for nausea or vomiting. What changed:  Another medication with the same name was removed. Continue taking this medication, and follow the directions you see here.   ranitidine 150 MG tablet Commonly known as:  ZANTAC Take 1 tablet (150 mg total) by mouth 2 (two) times daily.      Discharge Instructions: Please refer to Patient Instructions section of EMR for full details.  Patient was counseled important signs and symptoms that should prompt return to medical care, changes in medications, dietary instructions, activity restrictions, and follow up appointments.   Follow-Up Appointments: Follow-up Information    [facility]. Schedule an appointment as soon as possible for a visit.   Contact information: [address] [place] [address] [phone]         Freddrick March, MD 03/26/2017, 12:06 AM PGY-1, [facility]

## 2017-03-30 ENCOUNTER — Telehealth: Payer: Self-pay | Admitting: General Practice

## 2017-03-30 NOTE — Telephone Encounter (Signed)
Pt didn't answer but left a message requesting pt to call back and schedule a hospital f/u. - Mesha Guinyard

## 2017-08-17 ENCOUNTER — Encounter (HOSPITAL_COMMUNITY): Payer: Self-pay | Admitting: Family Medicine

## 2017-08-17 ENCOUNTER — Inpatient Hospital Stay (HOSPITAL_COMMUNITY)
Admission: EM | Admit: 2017-08-17 | Discharge: 2017-08-19 | DRG: 103 | Disposition: A | Discharge status: Home / Self Care | Payer: Self-pay | Attending: Internal Medicine | Admitting: Internal Medicine

## 2017-08-17 DIAGNOSIS — K920 Hematemesis: Secondary | ICD-10-CM | POA: Diagnosis present

## 2017-08-17 DIAGNOSIS — F17210 Nicotine dependence, cigarettes, uncomplicated: Secondary | ICD-10-CM | POA: Diagnosis present

## 2017-08-17 DIAGNOSIS — G43A0 Cyclical vomiting, not intractable: Principal | ICD-10-CM | POA: Diagnosis present

## 2017-08-17 DIAGNOSIS — Z8659 Personal history of other mental and behavioral disorders: Secondary | ICD-10-CM

## 2017-08-17 DIAGNOSIS — K3184 Gastroparesis: Secondary | ICD-10-CM | POA: Diagnosis present

## 2017-08-17 DIAGNOSIS — F1910 Other psychoactive substance abuse, uncomplicated: Secondary | ICD-10-CM | POA: Diagnosis present

## 2017-08-17 DIAGNOSIS — F419 Anxiety disorder, unspecified: Secondary | ICD-10-CM | POA: Diagnosis present

## 2017-08-17 DIAGNOSIS — R112 Nausea with vomiting, unspecified: Secondary | ICD-10-CM

## 2017-08-17 DIAGNOSIS — F3131 Bipolar disorder, current episode depressed, mild: Secondary | ICD-10-CM

## 2017-08-17 DIAGNOSIS — I10 Essential (primary) hypertension: Secondary | ICD-10-CM | POA: Diagnosis present

## 2017-08-17 DIAGNOSIS — Z91018 Allergy to other foods: Secondary | ICD-10-CM

## 2017-08-17 DIAGNOSIS — R1115 Cyclical vomiting syndrome unrelated to migraine: Secondary | ICD-10-CM

## 2017-08-17 DIAGNOSIS — E876 Hypokalemia: Secondary | ICD-10-CM | POA: Diagnosis present

## 2017-08-17 DIAGNOSIS — D72829 Elevated white blood cell count, unspecified: Secondary | ICD-10-CM | POA: Diagnosis present

## 2017-08-17 DIAGNOSIS — F32A Depression, unspecified: Secondary | ICD-10-CM | POA: Diagnosis present

## 2017-08-17 DIAGNOSIS — F329 Major depressive disorder, single episode, unspecified: Secondary | ICD-10-CM | POA: Diagnosis present

## 2017-08-17 DIAGNOSIS — K219 Gastro-esophageal reflux disease without esophagitis: Secondary | ICD-10-CM | POA: Diagnosis present

## 2017-08-17 DIAGNOSIS — F320 Major depressive disorder, single episode, mild: Secondary | ICD-10-CM

## 2017-08-17 DIAGNOSIS — F319 Bipolar disorder, unspecified: Secondary | ICD-10-CM | POA: Diagnosis present

## 2017-08-17 DIAGNOSIS — Z79899 Other long term (current) drug therapy: Secondary | ICD-10-CM

## 2017-08-17 HISTORY — DX: Bipolar disorder, unspecified: F31.9

## 2017-08-17 LAB — URINALYSIS, ROUTINE W REFLEX MICROSCOPIC
Bilirubin Urine: NEGATIVE
Glucose, UA: NEGATIVE mg/dL
Ketones, ur: NEGATIVE mg/dL
Nitrite: NEGATIVE
Protein, ur: >=300 mg/dL — AB
SPECIFIC GRAVITY, URINE: 1.031 — AB (ref 1.005–1.030)
pH: 5 (ref 5.0–8.0)

## 2017-08-17 LAB — TYPE AND SCREEN
ABO/RH(D): A POS
ANTIBODY SCREEN: NEGATIVE

## 2017-08-17 LAB — COMPREHENSIVE METABOLIC PANEL
ALBUMIN: 5.1 g/dL — AB (ref 3.5–5.0)
ALT: 23 U/L (ref 14–54)
AST: 34 U/L (ref 15–41)
Alkaline Phosphatase: 67 U/L (ref 38–126)
Anion gap: 14 (ref 5–15)
BILIRUBIN TOTAL: 0.6 mg/dL (ref 0.3–1.2)
BUN: 10 mg/dL (ref 6–20)
CHLORIDE: 100 mmol/L — AB (ref 101–111)
CO2: 25 mmol/L (ref 22–32)
Calcium: 10 mg/dL (ref 8.9–10.3)
Creatinine, Ser: 0.82 mg/dL (ref 0.44–1.00)
GFR calc Af Amer: >60 mL/min (ref >60)
GFR calc non Af Amer: >60 mL/min (ref >60)
GLUCOSE: 143 mg/dL — AB (ref 65–99)
POTASSIUM: 3.2 mmol/L — AB (ref 3.5–5.1)
Sodium: 139 mmol/L (ref 135–145)
TOTAL PROTEIN: 9.2 g/dL — AB (ref 6.5–8.1)

## 2017-08-17 LAB — CBC
HEMATOCRIT: 37.4 % (ref 36.0–46.0)
Hemoglobin: 12.7 g/dL (ref 12.0–15.0)
MCH: 31.8 pg (ref 26.0–34.0)
MCHC: 34 g/dL (ref 30.0–36.0)
MCV: 93.5 fL (ref 78.0–100.0)
Platelets: 240 10*3/uL (ref 150–400)
RBC: 4 MIL/uL (ref 3.87–5.11)
RDW: 13.4 % (ref 11.5–15.5)
WBC: 17.8 10*3/uL — ABNORMAL HIGH (ref 4.0–10.5)

## 2017-08-17 LAB — RAPID URINE DRUG SCREEN, HOSP PERFORMED
Amphetamines: NOT DETECTED
BARBITURATES: NOT DETECTED
Benzodiazepines: NOT DETECTED
Cocaine: POSITIVE — AB
Opiates: NOT DETECTED
Tetrahydrocannabinol: POSITIVE — AB

## 2017-08-17 LAB — ABO/RH: ABO/RH(D): A POS

## 2017-08-17 LAB — MAGNESIUM: MAGNESIUM: 1.5 mg/dL — AB (ref 1.7–2.4)

## 2017-08-17 LAB — LIPASE, BLOOD: Lipase: 18 U/L (ref 11–51)

## 2017-08-17 LAB — I-STAT BETA HCG BLOOD, ED (MC, WL, AP ONLY)

## 2017-08-17 LAB — ETHANOL

## 2017-08-17 MED ORDER — LEVALBUTEROL HCL 0.63 MG/3ML IN NEBU: 0.6300 mg | INHALATION_SOLUTION | Freq: Four times a day (QID) | RESPIRATORY_TRACT | Status: DC | PRN

## 2017-08-17 MED ORDER — LORAZEPAM 1 MG PO TABS: 1.0000 mg | ORAL_TABLET | Freq: Four times a day (QID) | ORAL | Status: DC | PRN

## 2017-08-17 MED ORDER — PANTOPRAZOLE SODIUM 40 MG IV SOLR: 40.0000 mg | Freq: Two times a day (BID) | INTRAVENOUS | Status: DC

## 2017-08-17 MED ORDER — LACTATED RINGERS IV BOLUS (SEPSIS)
1000.0000 mL | Freq: Once | INTRAVENOUS | Status: AC
  Administered 2017-08-17: 1000 mL via INTRAVENOUS

## 2017-08-17 MED ORDER — LABETALOL HCL 5 MG/ML IV SOLN
10.0000 mg | Freq: Once | INTRAVENOUS | Status: AC
  Administered 2017-08-17: 10 mg via INTRAVENOUS
  Filled 2017-08-17: qty 4

## 2017-08-17 MED ORDER — SODIUM CHLORIDE 0.9 % IV BOLUS (SEPSIS)
1000.0000 mL | Freq: Once | INTRAVENOUS | Status: AC
  Administered 2017-08-17: 1000 mL via INTRAVENOUS

## 2017-08-17 MED ORDER — VITAMIN B-1 100 MG PO TABS
100.0000 mg | ORAL_TABLET | Freq: Every day | ORAL | Status: DC
  Administered 2017-08-18 – 2017-08-19 (×2): 100 mg via ORAL
  Filled 2017-08-17 (×2): qty 1

## 2017-08-17 MED ORDER — ONDANSETRON HCL 4 MG/2ML IJ SOLN
4.0000 mg | Freq: Four times a day (QID) | INTRAMUSCULAR | Status: DC | PRN
  Administered 2017-08-18: 4 mg via INTRAVENOUS
  Filled 2017-08-17: qty 2

## 2017-08-17 MED ORDER — FOLIC ACID 1 MG PO TABS
1.0000 mg | ORAL_TABLET | Freq: Every day | ORAL | Status: DC
  Administered 2017-08-18 – 2017-08-19 (×2): 1 mg via ORAL
  Filled 2017-08-17 (×2): qty 1

## 2017-08-17 MED ORDER — OLANZAPINE 5 MG PO TABS
15.0000 mg | ORAL_TABLET | Freq: Every day | ORAL | Status: DC
  Administered 2017-08-17 – 2017-08-18 (×2): 15 mg via ORAL
  Filled 2017-08-17 (×2): qty 3

## 2017-08-17 MED ORDER — ONDANSETRON 4 MG PO TBDP
4.0000 mg | ORAL_TABLET | Freq: Once | ORAL | Status: AC | PRN
Start: 2017-08-17 — End: 2017-08-17
  Administered 2017-08-17: 4 mg via ORAL
  Filled 2017-08-17: qty 1

## 2017-08-17 MED ORDER — ACETAMINOPHEN 325 MG PO TABS
650.0000 mg | ORAL_TABLET | Freq: Four times a day (QID) | ORAL | Status: DC | PRN
  Administered 2017-08-17 – 2017-08-18 (×2): 650 mg via ORAL
  Filled 2017-08-17 (×2): qty 2

## 2017-08-17 MED ORDER — PANTOPRAZOLE SODIUM 40 MG IV SOLR
40.0000 mg | Freq: Once | INTRAVENOUS | Status: AC
  Administered 2017-08-17: 40 mg via INTRAVENOUS
  Filled 2017-08-17: qty 40

## 2017-08-17 MED ORDER — MORPHINE SULFATE (PF) 4 MG/ML IV SOLN
4.0000 mg | Freq: Once | INTRAVENOUS | Status: AC
  Administered 2017-08-17: 4 mg via INTRAVENOUS
  Filled 2017-08-17: qty 1

## 2017-08-17 MED ORDER — HYDRALAZINE HCL 20 MG/ML IJ SOLN
5.0000 mg | INTRAMUSCULAR | Status: DC | PRN
  Administered 2017-08-18 (×2): 5 mg via INTRAVENOUS
  Filled 2017-08-17 (×2): qty 1

## 2017-08-17 MED ORDER — ADULT MULTIVITAMIN W/MINERALS CH
1.0000 | ORAL_TABLET | Freq: Every day | ORAL | Status: DC
  Administered 2017-08-18 – 2017-08-19 (×2): 1 via ORAL
  Filled 2017-08-17 (×2): qty 1

## 2017-08-17 MED ORDER — PANTOPRAZOLE SODIUM 40 MG IV SOLR
8.0000 mg/h | INTRAVENOUS | Status: DC
  Administered 2017-08-17: 8 mg/h via INTRAVENOUS
  Filled 2017-08-17 (×6): qty 80

## 2017-08-17 MED ORDER — PROMETHAZINE HCL 25 MG/ML IJ SOLN
12.5000 mg | Freq: Once | INTRAMUSCULAR | Status: AC
  Administered 2017-08-17: 12.5 mg via INTRAVENOUS
  Filled 2017-08-17: qty 1

## 2017-08-17 MED ORDER — ONDANSETRON HCL 4 MG PO TABS
4.0000 mg | ORAL_TABLET | Freq: Four times a day (QID) | ORAL | Status: DC | PRN
  Administered 2017-08-18 – 2017-08-19 (×2): 4 mg via ORAL
  Filled 2017-08-17 (×2): qty 1

## 2017-08-17 MED ORDER — METOCLOPRAMIDE HCL 5 MG/ML IJ SOLN
10.0000 mg | Freq: Once | INTRAMUSCULAR | Status: AC
  Administered 2017-08-17: 10 mg via INTRAVENOUS
  Filled 2017-08-17: qty 2

## 2017-08-17 MED ORDER — THIAMINE HCL 100 MG/ML IJ SOLN: 100.0000 mg | Freq: Every day | INTRAMUSCULAR | Status: DC

## 2017-08-17 MED ORDER — DIPHENHYDRAMINE HCL 50 MG/ML IJ SOLN
25.0000 mg | Freq: Once | INTRAMUSCULAR | Status: AC
  Administered 2017-08-17: 25 mg via INTRAVENOUS
  Filled 2017-08-17: qty 1

## 2017-08-17 MED ORDER — SODIUM CHLORIDE 0.9 % IV SOLN
80.0000 mg | Freq: Once | INTRAVENOUS | Status: AC
  Administered 2017-08-17: 12:00:00 80 mg via INTRAVENOUS
  Filled 2017-08-17: qty 80

## 2017-08-17 MED ORDER — POTASSIUM CHLORIDE IN NACL 40-0.9 MEQ/L-% IV SOLN
INTRAVENOUS | Status: DC
  Administered 2017-08-17: 100 mL/h via INTRAVENOUS
  Filled 2017-08-17 (×2): qty 1000

## 2017-08-17 MED ORDER — NICOTINE 7 MG/24HR TD PT24
7.0000 mg | MEDICATED_PATCH | Freq: Every day | TRANSDERMAL | Status: DC
  Administered 2017-08-17 – 2017-08-19 (×3): 7 mg via TRANSDERMAL
  Filled 2017-08-17 (×3): qty 1

## 2017-08-17 MED ORDER — LORAZEPAM 2 MG/ML IJ SOLN: 1.0000 mg | Freq: Four times a day (QID) | INTRAMUSCULAR | Status: DC | PRN

## 2017-08-17 MED ORDER — ENOXAPARIN SODIUM 40 MG/0.4ML ~~LOC~~ SOLN: 40.0000 mg | SUBCUTANEOUS | Status: DC

## 2017-08-17 MED ORDER — ACETAMINOPHEN 650 MG RE SUPP: 650.0000 mg | Freq: Four times a day (QID) | RECTAL | Status: DC | PRN

## 2017-08-17 MED ORDER — MAGNESIUM SULFATE 2 GM/50ML IV SOLN
2.0000 g | Freq: Once | INTRAVENOUS | Status: AC
  Administered 2017-08-17: 2 g via INTRAVENOUS
  Filled 2017-08-17: qty 50

## 2017-08-17 MED ORDER — HYDROXYZINE HCL 50 MG PO TABS
50.0000 mg | ORAL_TABLET | Freq: Two times a day (BID) | ORAL | Status: DC
  Administered 2017-08-17 – 2017-08-19 (×4): 50 mg via ORAL
  Filled 2017-08-17 (×4): qty 1

## 2017-08-17 NOTE — ED Notes (Signed)
Call report to [facility] at [phone] at [phone]
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
## 2017-08-17 NOTE — Care Management Note (Signed)
Case Management Note  CM noted pt's previous ED/Admission activity over the years.  Noted that she did not follow up with Family Medicine earlier this year after inpt D/C.  Per Erskine Squibb, CM with [facility] pt would not be appropriate for their clinic but if pt is willing to go, she can follow up with [facility].  Put information on AVS for time of D/C for pt to call and make an appointment.

## 2017-08-17 NOTE — Plan of Care (Signed)
Problem: Nutrition: Goal: Adequate nutrition will be maintained Outcome: Progressing NPO at this time for EGD tomorrow

## 2017-08-17 NOTE — ED Triage Notes (Signed)
Patient reports she is experiencing generalized abd pain that started 12 hrs ago. Patient has been experiencing this intermittently for the last year. Also, has nausea and vomiting. She reports she has been taking Phenergan and Zofran tablets with no relief.
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
## 2017-08-18 ENCOUNTER — Inpatient Hospital Stay (HOSPITAL_COMMUNITY): Payer: Self-pay | Admitting: Anesthesiology

## 2017-08-18 ENCOUNTER — Encounter (HOSPITAL_COMMUNITY): Payer: Self-pay | Admitting: *Deleted

## 2017-08-18 ENCOUNTER — Encounter (HOSPITAL_COMMUNITY)
Admission: EM | Disposition: A | Discharge status: Home / Self Care | Payer: Self-pay | Source: Home / Self Care | Attending: Internal Medicine

## 2017-08-18 DIAGNOSIS — K920 Hematemesis: Secondary | ICD-10-CM

## 2017-08-18 DIAGNOSIS — K3189 Other diseases of stomach and duodenum: Secondary | ICD-10-CM

## 2017-08-18 DIAGNOSIS — G43A0 Cyclical vomiting, not intractable: Principal | ICD-10-CM

## 2017-08-18 DIAGNOSIS — K228 Other specified diseases of esophagus: Secondary | ICD-10-CM

## 2017-08-18 HISTORY — PX: ESOPHAGOGASTRODUODENOSCOPY: SHX5428

## 2017-08-18 LAB — COMPREHENSIVE METABOLIC PANEL
ALBUMIN: 3.5 g/dL (ref 3.5–5.0)
ALK PHOS: 51 U/L (ref 38–126)
ALT: 17 U/L (ref 14–54)
ANION GAP: 7 (ref 5–15)
AST: 24 U/L (ref 15–41)
BUN: 8 mg/dL (ref 6–20)
CALCIUM: 8.2 mg/dL — AB (ref 8.9–10.3)
CHLORIDE: 108 mmol/L (ref 101–111)
CO2: 23 mmol/L (ref 22–32)
CREATININE: 0.79 mg/dL (ref 0.44–1.00)
GFR calc Af Amer: >60 mL/min (ref >60)
GFR calc non Af Amer: >60 mL/min (ref >60)
GLUCOSE: 95 mg/dL (ref 65–99)
Potassium: 3.6 mmol/L (ref 3.5–5.1)
SODIUM: 138 mmol/L (ref 135–145)
Total Bilirubin: 0.6 mg/dL (ref 0.3–1.2)
Total Protein: 6.3 g/dL — ABNORMAL LOW (ref 6.5–8.1)

## 2017-08-18 LAB — CBC
HCT: 33.6 % — ABNORMAL LOW (ref 36.0–46.0)
HEMOGLOBIN: 11.3 g/dL — AB (ref 12.0–15.0)
MCH: 31.9 pg (ref 26.0–34.0)
MCHC: 33.6 g/dL (ref 30.0–36.0)
MCV: 94.9 fL (ref 78.0–100.0)
Platelets: 191 10*3/uL (ref 150–400)
RBC: 3.54 MIL/uL — AB (ref 3.87–5.11)
RDW: 13.7 % (ref 11.5–15.5)
WBC: 12.6 10*3/uL — ABNORMAL HIGH (ref 4.0–10.5)

## 2017-08-18 SURGERY — EGD (ESOPHAGOGASTRODUODENOSCOPY)
Anesthesia: Monitor Anesthesia Care

## 2017-08-18 MED ORDER — LACTATED RINGERS IV SOLN
INTRAVENOUS | Status: DC | PRN
  Administered 2017-08-18: 09:00:00 via INTRAVENOUS

## 2017-08-18 MED ORDER — PROPOFOL 500 MG/50ML IV EMUL
INTRAVENOUS | Status: DC | PRN
  Administered 2017-08-18: 75 ug/kg/min via INTRAVENOUS

## 2017-08-18 MED ORDER — PROPOFOL 10 MG/ML IV BOLUS
INTRAVENOUS | Status: DC | PRN
  Administered 2017-08-18: 50 mg via INTRAVENOUS
  Administered 2017-08-18: 20 mg via INTRAVENOUS

## 2017-08-18 MED ORDER — ONDANSETRON HCL 4 MG PO TABS: 4.0000 mg | ORAL_TABLET | Freq: Four times a day (QID) | ORAL | 0 refills | Status: DC | PRN

## 2017-08-18 MED ORDER — PANTOPRAZOLE SODIUM 40 MG PO TBEC: 40.0000 mg | DELAYED_RELEASE_TABLET | Freq: Two times a day (BID) | ORAL | 1 refills | Status: DC

## 2017-08-18 MED ORDER — MAGNESIUM SULFATE 2 GM/50ML IV SOLN
2.0000 g | Freq: Once | INTRAVENOUS | Status: DC
Start: 2017-08-18 — End: 2017-08-18

## 2017-08-18 MED ORDER — LACTATED RINGERS IV SOLN
INTRAVENOUS | Status: DC
  Administered 2017-08-18: 09:00:00 via INTRAVENOUS

## 2017-08-18 MED ORDER — PROPOFOL 10 MG/ML IV BOLUS
INTRAVENOUS | Status: AC
  Filled 2017-08-18: qty 40
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
## 2017-08-18 NOTE — Discharge Instructions (Signed)
YOU HAD AN ENDOSCOPIC PROCEDURE TODAY: Refer to the procedure report and other information in the discharge instructions given to you for any specific questions about what was found during the examination. If this information does not answer your questions, please call [place] office at [phone] to clarify.   YOU SHOULD EXPECT: Some feelings of bloating in the abdomen. Passage of more gas than usual. Walking can help get rid of the air that was put into your GI tract during the procedure and reduce the bloating. If you had a lower endoscopy (such as a colonoscopy or flexible sigmoidoscopy) you may notice spotting of blood in your stool or on the toilet paper. Some abdominal soreness may be present for a day or two, also.  DIET: Your first meal following the procedure should be a light meal and then it is ok to progress to your normal diet. A half-sandwich or bowl of soup is an example of a good first meal. Heavy or fried foods are harder to digest and may make you feel nauseous or bloated. Drink plenty of fluids but you should avoid alcoholic beverages for 24 hours. If you had a esophageal dilation, please see attached instructions for diet.    ACTIVITY: Your care partner should take you home directly after the procedure. You should plan to take it easy, moving slowly for the rest of the day. You can resume normal activity the day after the procedure however YOU SHOULD NOT DRIVE, use power tools, machinery or perform tasks that involve climbing or major physical exertion for 24 hours (because of the sedation medicines used during the test).   SYMPTOMS TO REPORT IMMEDIATELY: A gastroenterologist can be reached at any hour. Please call [phone]  for any of the following symptoms:  Following lower endoscopy (colonoscopy, flexible sigmoidoscopy) Excessive amounts of blood in the stool  Significant tenderness, worsening of abdominal pains  Swelling of the abdomen that is new, acute  Fever of 100 or  higher  Following upper endoscopy (EGD, EUS, ERCP, esophageal dilation) Vomiting of blood or coffee ground material  New, significant abdominal pain  New, significant chest pain or pain under the shoulder blades  Painful or persistently difficult swallowing  New shortness of breath  Black, tarry-looking or red, bloody stools  FOLLOW UP:  If any biopsies were taken you will be contacted by phone or by letter within the next 1-3 weeks. Call [phone]  if you have not heard about the biopsies in 3 weeks.  Please also call with any specific questions about appointments or follow up tests.YOU HAD AN ENDOSCOPIC PROCEDURE TODAY: Refer to the procedure report and other information in the discharge instructions given to you for any specific questions about what was found during the examination. If this information does not answer your questions, please call [place] office at [phone] to clarify.   YOU SHOULD EXPECT: Some feelings of bloating in the abdomen. Passage of more gas than usual. Walking can help get rid of the air that was put into your GI tract during the procedure and reduce the bloating. If you had a lower endoscopy (such as a colonoscopy or flexible sigmoidoscopy) you may notice spotting of blood in your stool or on the toilet paper. Some abdominal soreness may be present for a day or two, also.  DIET: Your first meal following the procedure should be a light meal and then it is ok to progress to your normal diet. A half-sandwich or bowl of soup is an example of a   good first meal. Heavy or fried foods are harder to digest and may make you feel nauseous or bloated. Drink plenty of fluids but you should avoid alcoholic beverages for 24 hours. If you had a esophageal dilation, please see attached instructions for diet.    ACTIVITY: Your care partner should take you home directly after the procedure. You should plan to take it easy, moving slowly for the rest of the day. You can resume  normal activity the day after the procedure however YOU SHOULD NOT DRIVE, use power tools, machinery or perform tasks that involve climbing or major physical exertion for 24 hours (because of the sedation medicines used during the test).   SYMPTOMS TO REPORT IMMEDIATELY: A gastroenterologist can be reached at any hour. Please call [phone]  for any of the following symptoms:  Following lower endoscopy (colonoscopy, flexible sigmoidoscopy) Excessive amounts of blood in the stool  Significant tenderness, worsening of abdominal pains  Swelling of the abdomen that is new, acute  Fever of 100 or higher  Following upper endoscopy (EGD, EUS, ERCP, esophageal dilation) Vomiting of blood or coffee ground material  New, significant abdominal pain  New, significant chest pain or pain under the shoulder blades  Painful or persistently difficult swallowing  New shortness of breath  Black, tarry-looking or red, bloody stools  FOLLOW UP:  If any biopsies were taken you will be contacted by phone or by letter within the next 1-3 weeks. Call [phone]  if you have not heard about the biopsies in 3 weeks.  Please also call with any specific questions about appointments or follow up tests. 

## 2017-08-18 NOTE — Transfer of Care (Signed)
Immediate Anesthesia Transfer of Care Note  Patient: Taylor Bartlett  Procedure(s) Performed: Procedure(s): ESOPHAGOGASTRODUODENOSCOPY (EGD) (N/A)  Patient Location: PACU  Anesthesia Type:MAC  Level of Consciousness:  sedated, patient cooperative and responds to stimulation  Airway & Oxygen Therapy:Patient Spontanous Breathing and Patient connected to face mask oxgen  Post-op Assessment:  Report given to PACU RN and Post -op Vital signs reviewed and stable  Post vital signs:  Reviewed and stable  Last Vitals:  Vitals:   08/18/17 0435 08/18/17 0845  BP: (!) 163/93 (!) 172/96  Pulse: 86 75  Resp: 18 14  Temp: 36.5 C 36.4 C  SpO2: 09% 62%    Complications: No apparent anesthesia complications

## 2017-08-18 NOTE — Plan of Care (Signed)
Problem: Nutrition: Goal: Adequate nutrition will be maintained Outcome: Not Progressing Pt NPO   

## 2017-08-18 NOTE — Interval H&P Note (Signed)
History and Physical Interval Note:  08/18/2017 8:58 AM  Taylor Bartlett  has presented today for surgery, with the diagnosis of hematemesis  The various methods of treatment have been discussed with the patient and family. After consideration of risks, benefits and other options for treatment, the patient has consented to  Procedure(s): ESOPHAGOGASTRODUODENOSCOPY (EGD) (N/A) as a surgical intervention .  The patient's history has been reviewed, patient examined, no change in status, stable for surgery.  I have reviewed the patient's chart and labs.  Questions were answered to the patient's satisfaction.     [name]

## 2017-08-18 NOTE — Anesthesia Postprocedure Evaluation (Signed)
Anesthesia Post Note  Patient: Taylor Bartlett  Procedure(s) Performed: Procedure(s) (LRB): ESOPHAGOGASTRODUODENOSCOPY (EGD) (N/A)     Patient location during evaluation: PACU Anesthesia Type: MAC Level of consciousness: awake and alert Pain management: pain level controlled Vital Signs Assessment: post-procedure vital signs reviewed and stable Respiratory status: spontaneous breathing, nonlabored ventilation, respiratory function stable and patient connected to nasal cannula oxygen Cardiovascular status: stable and blood pressure returned to baseline Postop Assessment: no apparent nausea or vomiting Anesthetic complications: no    Last Vitals:  Vitals:   08/18/17 0924 08/18/17 0930  BP: (!) 169/102 (!) 173/97  Pulse: 85 77  Resp: 14 17  Temp: 36.6 C   SpO2: 100% 100%    Last Pain:  Vitals:   08/18/17 0924  TempSrc: Oral  PainSc:                  Gentle Hoge S

## 2017-08-18 NOTE — Discharge Summary (Addendum)
Physician Discharge Summary  Taylor Bartlett PNT:[id number] DOB: [birth year] DOA: 08/17/2017  PCP: Patient, No Pcp Per  Admit date: 08/17/2017 Discharge date: 08/18/2017  Recommendations for Outpatient Follow-up:  1. Continue Protonix twice daily, clear liquids and advance as tolerated  Discharge Diagnoses:  Principal Problem:   Cyclic vomiting syndrome Active Problems:   Depression   Hypokalemia   Polysubstance abuse   Bipolar affective disorder, currently depressed, mild (HCC)   Cyclical vomiting   Hematemesis with nausea    Discharge Condition: stable   Diet recommendation: as tolerated   History of present illness:  31 year old female with history of substance abuse, alcohol abuse, anxiety and depression, bipolar disorder, cyclical vomiting syndrome and intractable nausea and vomiting dating as bad as 2014 attributed in part to marijuana use. Patient presented to ED with worsening generalized abdominal pain, started 12 hours prior to the admission associated with nausea and ongoing vomiting with blood. Patient was seen by GI in consultation.  Hospital Course:    Assessment & Plan:   Principal Problem:   Cyclic vomiting syndrome / nausea and vomiting in adult / hematemesis - Continue Protonix drip in addition to Protonix 40 mg IV every 12 hours - Hemoglobin is 11.3 this morning - EGD showed Multiple plates in the lower third of the esophagus, concerning for candidiasis, brushings performed. Per GI continue Protonix 40 mg twice daily; okay for discharge from GI standpoint  Active Problems:   Depression, anxiety / Bipolar affective disorder, currently depressed, mild (HCC) - Continue Zyprexa    Hypokalemia / hypomagnesemia - Due to GI losses - Supplemented    Tobacco use - Counseled on cessation - Nicotine patch ordered      Leukocytosis - Likely reactive. Patient did have trace leukocytes on urinalysis but no GU complaints - Patient was not started on  antibiotics - White blood cell count trended down from 17.8 down to 12.6   DVT prophylaxis: SCDs due to risk of bleeding, hematemesis Code Status: full code  Family Communication: Family at bedside    Consultants:   GI  Procedures:   EGD  Antimicrobials:   None    Signed:  Manson Passey, MD  [facility] 08/18/2017, 10:06 AM  Pager #: [phone]      Discharge Exam: Vitals:   08/18/17 0924 08/18/17 0930  BP: (!) 169/102 (!) 173/97  Pulse: 85 77  Resp: 14 17  Temp: 97.9 F (36.6 C)   SpO2: 100% 100%   Vitals:   08/18/17 0435 08/18/17 0845 08/18/17 0924 08/18/17 0930  BP: (!) 163/93 (!) 172/96 (!) 169/102 (!) 173/97  Pulse: 86 75 85 77  Resp: Temp: 97.7 F (36.5 C) 97.6 F (36.4 C) 97.9 F (36.6 C)   TempSrc: Oral Oral Oral   SpO2: 98% 98% 100% 100%  Weight:  67.3 kg (148 lb 5.9 oz)    Height:   (1.6 m)      General: Pt is alert, follows commands appropriately, not in acute distress Cardiovascular: Regular rate and rhythm, S1/S2 +, no murmurs Respiratory: Clear to auscultation bilaterally, no wheezing, no crackles, no rhonchi Abdominal: Soft, non tender, non distended, bowel sounds +, no guarding Extremities: no edema, no cyanosis, pulses palpable bilaterally DP and PT Neuro: Grossly nonfocal  Discharge Instructions  Discharge Instructions    Call MD for:  persistant nausea and vomiting    Complete by:  As directed    Call MD for:  redness, tenderness, or signs of  infection (pain, swelling, redness, odor or green/yellow discharge around incision site)    Complete by:  As directed    Call MD for:  severe uncontrolled pain    Complete by:  As directed    Diet - low sodium heart healthy    Complete by:  As directed    Discharge instructions    Complete by:  As directed    Continue Protonix 40 mg twice daily Continue clear liquid diet and advance slowly as tolerated   Increase activity slowly    Complete by:  As  directed      Allergies as of 08/19/2017      Reactions   Tomato Anaphylaxis, Itching      Medication List    STOP taking these medications   hydrochlorothiazide 25 MG tablet Commonly known as:  HYDRODIURIL   omeprazole 20 MG capsule Commonly known as:  PRILOSEC   promethazine 25 MG suppository Commonly known as:  PHENERGAN   ranitidine 150 MG tablet Commonly known as:  ZANTAC     TAKE these medications   hydrOXYzine 50 MG tablet Commonly known as:  ATARAX/VISTARIL Take 50 mg by mouth 2 (two) times daily.   OLANZapine 15 MG tablet Commonly known as:  ZYPREXA Take 15 mg by mouth at bedtime.   ondansetron 4 MG tablet Commonly known as:  ZOFRAN Take 1 tablet (4 mg total) by mouth every 6 (six) hours as needed for nausea or vomiting.   pantoprazole 40 MG tablet Commonly known as:  PROTONIX Take 1 tablet (40 mg total) by mouth 2 (two) times daily.   pantoprazole 40 MG tablet Commonly known as:  PROTONIX Take 1 tablet (40 mg total) by mouth daily.            Discharge Care Instructions        Start     Ordered   08/19/17 0000  pantoprazole (PROTONIX) 40 MG tablet  Daily     08/19/17 1449   08/18/17 0000  pantoprazole (PROTONIX) 40 MG tablet  2 times daily     08/18/17 1005   08/18/17 0000  Increase activity slowly     08/18/17 1005   08/18/17 0000  Diet - low sodium heart healthy     08/18/17 1005   08/18/17 0000  Discharge instructions    Comments:  Continue Protonix 40 mg twice daily Continue clear liquid diet and advance slowly as tolerated   08/18/17 1005   08/18/17 0000  Call MD for:  persistant nausea and vomiting     08/18/17 1005   08/18/17 0000  Call MD for:  severe uncontrolled pain     08/18/17 1005   08/18/17 0000  Call MD for:  redness, tenderness, or signs of infection (pain, swelling, redness, odor or green/yellow discharge around incision site)     08/18/17 1005   08/18/17 0000  ondansetron (ZOFRAN) 4 MG tablet  Every 6 hours PRN      08/18/17 1034         The results of significant diagnostics from this hospitalization (including imaging, microbiology, ancillary and laboratory) are listed below for reference.    Significant Diagnostic Studies: No results found.  Microbiology: No results found for this or any previous visit (from the past 240 hour(s)).   Labs: Basic Metabolic Panel:  Recent Labs Lab 08/17/17 0732 08/17/17 1145 08/18/17 0500  NA 139  --  138  K 3.2*  --  3.6  CL 100*  --  108  CO2 25  --  23  GLUCOSE 143*  --  95  BUN 10  --  8  CREATININE 0.82  --  0.79  CALCIUM 10.0  --  8.2*  MG  --  1.5*  --    Liver Function Tests:  Recent Labs Lab 08/17/17 0732 08/18/17 0500  AST 34 24  ALT 23 17  ALKPHOS 67 51  BILITOT 0.6 0.6  PROT 9.2* 6.3*  ALBUMIN 5.1* 3.5    Recent Labs Lab 08/17/17 0732  LIPASE 18   No results for input(s): AMMONIA in the last 168 hours. CBC:  Recent Labs Lab 08/17/17 0732 08/18/17 0500  WBC 17.8* 12.6*  HGB 12.7 11.3*  HCT 37.4 33.6*  MCV 93.5 94.9  PLT 240 191   Cardiac Enzymes: No results for input(s): CKTOTAL, CKMB, CKMBINDEX, TROPONINI in the last 168 hours. BNP: BNP (last 3 results) No results for input(s): BNP in the last 8760 hours.  ProBNP (last 3 results) No results for input(s): PROBNP in the last 8760 hours.  CBG: No results for input(s): GLUCAP in the last 168 hours.

## 2017-08-18 NOTE — Anesthesia Preprocedure Evaluation (Signed)
Anesthesia Evaluation  Patient identified by MRN, date of birth, ID band Patient awake    Reviewed: Allergy & Precautions, NPO status , Patient's Chart, lab work & pertinent test results  Airway Mallampati: II  TM Distance: >3 FB Neck ROM: Full    Dental no notable dental hx.    Pulmonary Current Smoker,    Pulmonary exam normal breath sounds clear to auscultation       Cardiovascular hypertension, Normal cardiovascular exam Rhythm:Regular Rate:Normal     Neuro/Psych Bipolar Disorder negative neurological ROS     GI/Hepatic GERD  ,(+)     substance abuse  ,   Endo/Other  negative endocrine ROS  Renal/GU negative Renal ROS  negative genitourinary   Musculoskeletal negative musculoskeletal ROS (+)   Abdominal   Peds negative pediatric ROS (+)  Hematology negative hematology ROS (+)   Anesthesia Other Findings   Reproductive/Obstetrics negative OB ROS                             Anesthesia Physical Anesthesia Plan  ASA: III  Anesthesia Plan: MAC   Post-op Pain Management:    Induction: Intravenous  PONV Risk Score and Plan: 0 and Treatment may vary due to age or medical condition  Airway Management Planned: Nasal Cannula  Additional Equipment:   Intra-op Plan:   Post-operative Plan:   Informed Consent: I have reviewed the patients History and Physical, chart, labs and discussed the procedure including the risks, benefits and alternatives for the proposed anesthesia with the patient or authorized representative who has indicated his/her understanding and acceptance.   Dental advisory given  Plan Discussed with: CRNA and Surgeon  Anesthesia Plan Comments:         Anesthesia Quick Evaluation

## 2017-08-18 NOTE — Op Note (Signed)
[facility] Patient Name: Taylor Bartlett Procedure Date: 08/18/2017 MRN: [id number] Attending MD: Carlota Raspberry. Taylor Angelo MD, MD Date of Birth: 28-Feb-1986 CSN: [id number] Age: 31 Admit Type: Inpatient Procedure:                Upper GI endoscopy Indications:              Heartburn, Hematemesis, Nausea with vomiting,                            history of marijuana and cocaine use Providers:                Carlota Raspberry. Lynden Flemmer MD, MD, Burtis Junes, RN, Marcene Duos, Technician, Anne Fu CRNA, CRNA Referring MD:              Medicines:                Monitored Anesthesia Care Complications:            No immediate complications. Estimated blood loss:                            Minimal. Estimated Blood Loss:     Estimated blood loss was minimal. Procedure:                Pre-Anesthesia Assessment:                           - Prior to the procedure, a History and Physical                            was performed, and patient medications and                            allergies were reviewed. The patient's tolerance of                            previous anesthesia was also reviewed. The risks                            and benefits of the procedure and the sedation                            options and risks were discussed with the patient.                            All questions were answered, and informed consent                            was obtained. Prior Anticoagulants: The patient has                            taken no previous anticoagulant or antiplatelet  agents. ASA Grade Assessment: II - A patient with                            mild systemic disease. After reviewing the risks                            and benefits, the patient was deemed in                            satisfactory condition to undergo the procedure.                           After obtaining informed consent, the endoscope was                          passed under direct vision. Throughout the                            procedure, the patient's blood pressure, pulse, and                            oxygen saturations were monitored continuously. The                            [id number] ([id number]) scope was introduced through the                            mouth, and advanced to the second part of duodenum.                            The upper GI endoscopy was accomplished without                            difficulty. The patient tolerated the procedure                            well. Scope In: Scope Out: Findings:      Esophagogastric landmarks were identified: the Z-line was found at 35       cm, the gastroesophageal junction was found at 35 cm and the upper       extent of the gastric folds was found at 38 cm from the incisors.      Multiple diminutive white plaques were found adherent to the lower third       of the esophagus, suspicious for esophageal candidiasis. Brushings were       obtained in the lower third of the esophagus.      The exam of the esophagus was otherwise normal.      Localized moderately erythematous mucosa was founds superficially in the       gastric fundus without focal ulceration. I suspect this is due to       wretching / vomiting. Biopsies were taken with a cold forceps for       histology.      The exam of the stomach was otherwise normal.      Biopsies were taken with a cold  forceps in the gastric body and in the       gastric antrum for Helicobacter pylori testing.      The duodenal bulb and second portion of the duodenum were normal. Impression:               - Esophagogastric landmarks identified.                           - Multiple plaques in the lower third of the                            esophagus, concerning for esophageal candidiasis.                            Brushings performed.                           - Superificial erythematous mucosa in the gastric                             fundus without focal ulceration. I suspect due to                            wretching / vomiting and could be the source of                            bleeding symptoms - no high risk stigmata for                            bleeding noted. Biopsied.                           - Otherwise normal stomach, biopsies obtained to                            rule out H pylori                           - Normal duodenal bulb and second portion of the                            duodenum.                           While we will await biopsies, I suspect nausea /                            vomiting could be related to substance abuse                            (marijuana / cocaine). Recommend complete cessation                            of marijuana and cocaine. Moderate Sedation:      No moderate sedation, case performed with MAC Recommendation:           -  Return patient to hospital ward for ongoing care.                           - Clear liquid diet now and advance as tolerated.                           - Continue present medications                           - Await pathology results.                           - Okay to discharge home from GI perspective if                            tolerating PO                           - Discharge on protonix 63m twice daily and Zofran                            PRN                           - We will sign off for now, she can follow up with                            me as an outpatient if symptoms persist Procedure Code(s):        --- Professional ---                           [id number] Esophagogastroduodenoscopy, flexible,                            transoral; with biopsy, single or multiple Diagnosis Code(s):        --- Professional ---                           K22.8, Other specified diseases of esophagus                           K31.89, Other diseases of stomach and duodenum                           R12, Heartburn                            K92.0, Hematemesis                           R11.2, Nausea with vomiting, unspecified CPT copyright 2016 [employer] Medical Association. All rights reserved. The codes documented in this report are preliminary and upon coder review may  be revised to meet current compliance requirements. SRemo LippsP. Adaleah Forget MD, MD 08/18/2017 9:23:41 AM This report has been signed electronically. Number of Addenda: 0

## 2017-08-18 NOTE — Progress Notes (Addendum)
Patient ID: Taylor Bartlett, female   DOB: 03-22-1986, 31 y.o.   MRN: [id number]  PROGRESS NOTE    Taylor Bartlett  JYN:[id number] DOB: 1986/07/02 DOA: 08/17/2017  PCP: Patient, No Pcp Per   Brief Narrative:   31 year old female with history of substance abuse, alcohol abuse, anxiety and depression, bipolar disorder, cyclical vomiting syndrome and intractable nausea and vomiting dating as bad as 2014 attributed in part to marijuana use. Patient presented to ED with worsening generalized abdominal pain, started 12 hours prior to the admission associated with nausea and ongoing vomiting with blood. Patient was seen by GI in consultation and plan is for EGD.  Assessment & Plan:   Principal Problem:   Cyclic vomiting syndrome / nausea and vomiting in adult / hematemesis - Continue Protonix drip in addition to Protonix 40 mg IV every 12 hours - Hemoglobin is 11.3 this morning - Plan for EGD today - Continue supportive care with IV fluids and analgesia as needed  Active Problems:   Depression, anxiety / Bipolar affective disorder, currently depressed, mild (HCC) - Continue Zyprexa    Hypokalemia / hypomagnesemia - Due to GI losses - Supplemented - Follow-up BMP and magnesium level in the morning    Tobacco use - Counseled on cessation - Nicotine patch ordered      Leukocytosis - Likely reactive. Patient did have trace leukocytes on urinalysis but no GU complaints - Patient was not started on antibiotics - White blood cell count trended down from 17.8 down to 12.6   DVT prophylaxis: SCDs due to risk of bleeding, hematemesis Code Status: full code  Family Communication: Family at bedside Disposition Plan: Plan for endoscopy this morning   Consultants:   GI  Procedures:   EGD today   Antimicrobials:   None    Subjective: No overnight events.  Objective: Vitals:   08/17/17 1651 08/17/17 2132 08/18/17 0435 08/18/17 0845  BP: (!) 148/87 136/87 (!) 163/93 (!)  172/96  Pulse: 76 73 86 75  Resp: Temp: 97.9 F (36.6 C) 98.3 F (36.8 C) 97.7 F (36.5 C) 97.6 F (36.4 C)  TempSrc: Oral Oral Oral Oral  SpO2: 100% 98% 98% 98%  Weight:    67.3 kg (148 lb 5.9 oz)  Height:     (1.6 m)    Intake/Output Summary (Last 24 hours) at 08/18/17 0852 Last data filed at 08/18/17 0200  Gross per 24 hour  Intake           3157.5 ml  Output                0 ml  Net           3157.5 ml   Filed Weights   08/18/17 0845  Weight: 67.3 kg (148 lb 5.9 oz)    Examination:  General exam: Appears calm and comfortable  Respiratory system: Clear to auscultation. Respiratory effort normal. Cardiovascular system: S1 & S2 heard, RRR. No JVD, murmurs, rubs, gallops or clicks. No pedal edema. Gastrointestinal system: Abdomen is nondistended, soft and nontender. No organomegaly or masses felt. Normal bowel sounds heard. Central nervous system: Alert and oriented. No focal neurological deficits. Extremities: Symmetric 5 x 5 power. Skin: No rashes, lesions or ulcers Psychiatry: Judgement and insight appear normal. Mood & affect appropriate.   Data Reviewed: I have personally reviewed following labs and imaging studies  CBC:  Recent Labs Lab 08/17/17 0732 08/18/17 0500  WBC 17.8* 12.6*  HGB 12.7  11.3*  HCT 37.4 33.6*  MCV 93.5 94.9  PLT 240 191   Basic Metabolic Panel:  Recent Labs Lab 08/17/17 0732 08/17/17 1145 08/18/17 0500  NA 139  --  138  K 3.2*  --  3.6  CL 100*  --  108  CO2 25  --  23  GLUCOSE 143*  --  95  BUN 10  --  8  CREATININE 0.82  --  0.79  CALCIUM 10.0  --  8.2*  MG  --  1.5*  --    GFR: Estimated Creatinine Clearance: 93.9 mL/min (by C-G formula based on SCr of 0.79 mg/dL). Liver Function Tests:  Recent Labs Lab 08/17/17 0732 08/18/17 0500  AST 34 24  ALT 23 17  ALKPHOS 67 51  BILITOT 0.6 0.6  PROT 9.2* 6.3*  ALBUMIN 5.1* 3.5    Recent Labs Lab 08/17/17 0732  LIPASE 18   No results for  input(s): AMMONIA in the last 168 hours. Coagulation Profile: No results for input(s): INR, PROTIME in the last 168 hours. Cardiac Enzymes: No results for input(s): CKTOTAL, CKMB, CKMBINDEX, TROPONINI in the last 168 hours. BNP (last 3 results) No results for input(s): PROBNP in the last 8760 hours. HbA1C: No results for input(s): HGBA1C in the last 72 hours. CBG: No results for input(s): GLUCAP in the last 168 hours. Lipid Profile: No results for input(s): CHOL, HDL, LDLCALC, TRIG, CHOLHDL, LDLDIRECT in the last 72 hours. Thyroid Function Tests: No results for input(s): TSH, T4TOTAL, FREET4, T3FREE, THYROIDAB in the last 72 hours. Anemia Panel: No results for input(s): VITAMINB12, FOLATE, FERRITIN, TIBC, IRON, RETICCTPCT in the last 72 hours. Urine analysis:    Component Value Date/Time   COLORURINE YELLOW 08/17/2017 0820   APPEARANCEUR HAZY (A) 08/17/2017 0820   LABSPEC 1.031 (H) 08/17/2017 0820   PHURINE 5.0 08/17/2017 0820   GLUCOSEU NEGATIVE 08/17/2017 0820   HGBUR MODERATE (A) 08/17/2017 0820   BILIRUBINUR NEGATIVE 08/17/2017 0820   KETONESUR NEGATIVE 08/17/2017 0820   PROTEINUR >=300 (A) 08/17/2017 0820   UROBILINOGEN 0.2 09/22/2015 1729   NITRITE NEGATIVE 08/17/2017 0820   LEUKOCYTESUR TRACE (A) 08/17/2017 0820   Sepsis Labs: (procalcitonin:4,lacticidven:4)   )No results found for this or any previous visit (from the past 240 hour(s)).    Radiology Studies: No results found.    Scheduled Meds: . [MAR Hold] folic acid  1 mg Oral Daily  . [MAR Hold] hydrOXYzine  50 mg Oral BID  . [MAR Hold] multivitamin with minerals  1 tablet Oral Daily  . [MAR Hold] nicotine  7 mg Transdermal Daily  . [MAR Hold] OLANZapine  15 mg Oral QHS  . [MAR Hold] pantoprazole  40 mg Intravenous Q12H  . [MAR Hold] thiamine  100 mg Oral Daily   Or  . [MAR Hold] thiamine  100 mg Intravenous Daily   Continuous Infusions: . 0.9 % NaCl with KCl 40 mEq / L 100 mL/hr (08/17/17  1751)  . lactated ringers 20 mL/hr at 08/18/17 0849  . pantoprozole (PROTONIX) infusion 8 mg/hr (08/17/17 1218)     LOS: 1 day    Time spent: 25 minutes  Greater than 50% of the time spent on counseling and coordinating the care.   Manson Passey, MD [facility] Pager [phone]  If 7PM-7AM, please contact night-coverage [email or web address] Password [facility] 08/18/2017, 8:52 AM

## 2017-08-19 ENCOUNTER — Encounter (HOSPITAL_COMMUNITY): Payer: Self-pay | Admitting: Gastroenterology

## 2017-08-19 LAB — BASIC METABOLIC PANEL
ANION GAP: 11 (ref 5–15)
BUN: 7 mg/dL (ref 6–20)
CALCIUM: 9.3 mg/dL (ref 8.9–10.3)
CO2: 24 mmol/L (ref 22–32)
Chloride: 98 mmol/L — ABNORMAL LOW (ref 101–111)
Creatinine, Ser: 0.91 mg/dL (ref 0.44–1.00)
GFR calc Af Amer: >60 mL/min (ref >60)
GLUCOSE: 89 mg/dL (ref 65–99)
Potassium: 3.5 mmol/L (ref 3.5–5.1)
SODIUM: 133 mmol/L — AB (ref 135–145)

## 2017-08-19 LAB — CBC
HEMATOCRIT: 39.4 % (ref 36.0–46.0)
Hemoglobin: 13.6 g/dL (ref 12.0–15.0)
MCH: 32 pg (ref 26.0–34.0)
MCHC: 34.5 g/dL (ref 30.0–36.0)
MCV: 92.7 fL (ref 78.0–100.0)
PLATELETS: 197 10*3/uL (ref 150–400)
RBC: 4.25 MIL/uL (ref 3.87–5.11)
RDW: 13.2 % (ref 11.5–15.5)
WBC: 18 10*3/uL — AB (ref 4.0–10.5)

## 2017-08-19 LAB — MAGNESIUM: MAGNESIUM: 2 mg/dL (ref 1.7–2.4)

## 2017-08-19 MED ORDER — PANTOPRAZOLE SODIUM 40 MG PO TBEC
40.0000 mg | DELAYED_RELEASE_TABLET | Freq: Every day | ORAL | 1 refills | Status: DC
Start: 2017-08-19 — End: 2018-01-17

## 2017-08-19 NOTE — Plan of Care (Signed)
Problem: Education: Goal: Knowledge of [facility] Education information/materials will improve Outcome: Completed/Met Date Met: 08/19/17 .  Problem: Safety: Goal: Ability to remain free from injury will improve Outcome: Completed/Met Date Met: 08/19/17 .  Problem: Health Behavior/Discharge Planning: Goal: Ability to manage health-related needs will improve Outcome: Progressing .  Problem: Pain Managment: Goal: General experience of comfort will improve Outcome: Progressing .  Problem: Physical Regulation: Goal: Ability to maintain clinical measurements within normal limits will improve Outcome: Progressing .  Problem: Skin Integrity: Goal: Risk for impaired skin integrity will decrease Outcome: Completed/Met Date Met: 08/19/17 .  Problem: Tissue Perfusion: Goal: Risk factors for ineffective tissue perfusion will decrease Outcome: Completed/Met Date Met: 08/19/17 .  Problem: Activity: Goal: Risk for activity intolerance will decrease Outcome: Completed/Met Date Met: 08/19/17 .  Problem: Nutrition: Goal: Adequate nutrition will be maintained Outcome: Progressing Pt able to tolerate clear liquid this morning.

## 2017-08-19 NOTE — Progress Notes (Signed)
Pt seen and examined at th e bedside. Feels better. Will monitor for breakfast and lunch time, hope she can go home later today. Please refer to d/c summary done 9/27.  Manson Passey [facility] [phone]

## 2017-08-23 ENCOUNTER — Telehealth: Payer: Self-pay

## 2017-08-23 NOTE — Telephone Encounter (Signed)
Message received from Eldridge Abrahams, RN CM requesting a hospital follow up appointment for the patient at [facility]. At the time the patient was in the ED, there were not any appointments available.  Attempted to contact the patient to notify her that there is an appointment available tomorrow  - 08/24/17. Call placed to # [phone] (M) and a HIPAA compliant voicemail message was left requesting a call back to # [phone]

## 2017-08-26 LAB — DRUG PROFILE, UR, 9 DRUGS (LABCORP)
Amphetamines, Urine: NEGATIVE ng/mL
Barbiturate, Ur: NEGATIVE ng/mL
Benzodiazepine Quant, Ur: NEGATIVE ng/mL
Cannabinoid Quant, Ur: POSITIVE — AB
Cocaine (Metab.): POSITIVE — AB
Methadone Screen, Urine: NEGATIVE ng/mL
Opiate Quant, Ur: NEGATIVE ng/mL
Phencyclidine, Ur: NEGATIVE
Propoxyphene, Urine: NEGATIVE ng/mL

## 2018-01-10 ENCOUNTER — Emergency Department (HOSPITAL_COMMUNITY)
Admission: EM | Admit: 2018-01-10 | Discharge: 2018-01-11 | Disposition: A | Discharge status: Home / Self Care | Payer: Self-pay | Attending: Emergency Medicine | Admitting: Emergency Medicine

## 2018-01-10 ENCOUNTER — Encounter (HOSPITAL_COMMUNITY): Payer: Self-pay | Admitting: Emergency Medicine

## 2018-01-10 DIAGNOSIS — F319 Bipolar disorder, unspecified: Secondary | ICD-10-CM | POA: Insufficient documentation

## 2018-01-10 DIAGNOSIS — K3184 Gastroparesis: Secondary | ICD-10-CM | POA: Insufficient documentation

## 2018-01-10 DIAGNOSIS — F17210 Nicotine dependence, cigarettes, uncomplicated: Secondary | ICD-10-CM | POA: Insufficient documentation

## 2018-01-10 DIAGNOSIS — R1084 Generalized abdominal pain: Secondary | ICD-10-CM | POA: Insufficient documentation

## 2018-01-10 DIAGNOSIS — I10 Essential (primary) hypertension: Secondary | ICD-10-CM | POA: Insufficient documentation

## 2018-01-10 DIAGNOSIS — R197 Diarrhea, unspecified: Secondary | ICD-10-CM | POA: Insufficient documentation

## 2018-01-10 DIAGNOSIS — Z79899 Other long term (current) drug therapy: Secondary | ICD-10-CM | POA: Insufficient documentation

## 2018-01-10 DIAGNOSIS — R042 Hemoptysis: Secondary | ICD-10-CM | POA: Insufficient documentation

## 2018-01-10 LAB — CBC
HCT: 38.5 % (ref 36.0–46.0)
HEMOGLOBIN: 13 g/dL (ref 12.0–15.0)
MCH: 31.9 pg (ref 26.0–34.0)
MCHC: 33.8 g/dL (ref 30.0–36.0)
MCV: 94.6 fL (ref 78.0–100.0)
Platelets: 260 10*3/uL (ref 150–400)
RBC: 4.07 MIL/uL (ref 3.87–5.11)
RDW: 13.3 % (ref 11.5–15.5)
WBC: 11.4 10*3/uL — ABNORMAL HIGH (ref 4.0–10.5)

## 2018-01-10 LAB — COMPREHENSIVE METABOLIC PANEL
ALT: 19 U/L (ref 14–54)
ANION GAP: 12 (ref 5–15)
AST: 26 U/L (ref 15–41)
Albumin: 4.7 g/dL (ref 3.5–5.0)
Alkaline Phosphatase: 72 U/L (ref 38–126)
BUN: 9 mg/dL (ref 6–20)
CHLORIDE: 106 mmol/L (ref 101–111)
CO2: 23 mmol/L (ref 22–32)
Calcium: 9.6 mg/dL (ref 8.9–10.3)
Creatinine, Ser: 0.96 mg/dL (ref 0.44–1.00)
GFR calc non Af Amer: >60 mL/min (ref >60)
Glucose, Bld: 146 mg/dL — ABNORMAL HIGH (ref 65–99)
POTASSIUM: 3.5 mmol/L (ref 3.5–5.1)
SODIUM: 141 mmol/L (ref 135–145)
Total Bilirubin: 0.7 mg/dL (ref 0.3–1.2)
Total Protein: 8.5 g/dL — ABNORMAL HIGH (ref 6.5–8.1)

## 2018-01-10 LAB — LIPASE, BLOOD: LIPASE: 24 U/L (ref 11–51)

## 2018-01-10 LAB — I-STAT BETA HCG BLOOD, ED (MC, WL, AP ONLY): I-stat hCG, quantitative: <5 m[IU]/mL (ref <5)

## 2018-01-10 MED ORDER — SODIUM CHLORIDE 0.9 % IV BOLUS (SEPSIS)
1000.0000 mL | Freq: Once | INTRAVENOUS | Status: AC
  Administered 2018-01-11: 1000 mL via INTRAVENOUS

## 2018-01-10 MED ORDER — ONDANSETRON HCL 4 MG/2ML IJ SOLN
4.0000 mg | Freq: Once | INTRAMUSCULAR | Status: AC
  Administered 2018-01-11: 4 mg via INTRAVENOUS
  Filled 2018-01-10: qty 2

## 2018-01-10 MED ORDER — KETOROLAC TROMETHAMINE 30 MG/ML IJ SOLN
30.0000 mg | Freq: Once | INTRAMUSCULAR | Status: AC
  Administered 2018-01-11: 30 mg via INTRAVENOUS
  Filled 2018-01-10: qty 1

## 2018-01-10 MED ORDER — METOCLOPRAMIDE HCL 5 MG/ML IJ SOLN
10.0000 mg | Freq: Once | INTRAMUSCULAR | Status: AC
  Administered 2018-01-11: 10 mg via INTRAVENOUS
  Filled 2018-01-10: qty 2

## 2018-01-10 NOTE — ED Triage Notes (Signed)
Patient c/o N/V/D with abdominal pain x2 days.

## 2018-01-10 NOTE — ED Notes (Signed)
Patient reports one episode of vomiting with dark red blood.
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
## 2018-01-10 NOTE — ED Notes (Signed)
Called several times and no response.

## 2018-01-11 MED ORDER — PROMETHAZINE HCL 25 MG RE SUPP: 25.0000 mg | Freq: Four times a day (QID) | RECTAL | 0 refills | Status: DC | PRN

## 2018-01-11 MED ORDER — METOCLOPRAMIDE HCL 10 MG PO TABS: 10.0000 mg | ORAL_TABLET | Freq: Three times a day (TID) | ORAL | 0 refills | Status: DC | PRN

## 2018-01-14 ENCOUNTER — Encounter (HOSPITAL_COMMUNITY): Payer: Self-pay | Admitting: Nurse Practitioner

## 2018-01-14 ENCOUNTER — Emergency Department (HOSPITAL_COMMUNITY)
Admission: EM | Admit: 2018-01-14 | Discharge: 2018-01-15 | Disposition: A | Discharge status: Home / Self Care | Payer: Self-pay | Attending: Emergency Medicine | Admitting: Emergency Medicine

## 2018-01-14 DIAGNOSIS — F17210 Nicotine dependence, cigarettes, uncomplicated: Secondary | ICD-10-CM | POA: Insufficient documentation

## 2018-01-14 DIAGNOSIS — R112 Nausea with vomiting, unspecified: Secondary | ICD-10-CM | POA: Insufficient documentation

## 2018-01-14 DIAGNOSIS — E876 Hypokalemia: Secondary | ICD-10-CM | POA: Insufficient documentation

## 2018-01-14 DIAGNOSIS — Z79899 Other long term (current) drug therapy: Secondary | ICD-10-CM | POA: Insufficient documentation

## 2018-01-14 DIAGNOSIS — I10 Essential (primary) hypertension: Secondary | ICD-10-CM | POA: Insufficient documentation

## 2018-01-14 LAB — COMPREHENSIVE METABOLIC PANEL
ALK PHOS: 68 U/L (ref 38–126)
ALT: 20 U/L (ref 14–54)
ANION GAP: 16 — AB (ref 5–15)
AST: 21 U/L (ref 15–41)
Albumin: 4.8 g/dL (ref 3.5–5.0)
BILIRUBIN TOTAL: 0.9 mg/dL (ref 0.3–1.2)
BUN: 14 mg/dL (ref 6–20)
CALCIUM: 9.9 mg/dL (ref 8.9–10.3)
CO2: 26 mmol/L (ref 22–32)
CREATININE: 0.8 mg/dL (ref 0.44–1.00)
Chloride: 93 mmol/L — ABNORMAL LOW (ref 101–111)
Glucose, Bld: 121 mg/dL — ABNORMAL HIGH (ref 65–99)
Potassium: 2.8 mmol/L — ABNORMAL LOW (ref 3.5–5.1)
Sodium: 135 mmol/L (ref 135–145)
TOTAL PROTEIN: 9.2 g/dL — AB (ref 6.5–8.1)

## 2018-01-14 LAB — URINALYSIS, ROUTINE W REFLEX MICROSCOPIC
Glucose, UA: NEGATIVE mg/dL
NITRITE: NEGATIVE
PROTEIN: 100 mg/dL — AB
pH: 6 (ref 5.0–8.0)

## 2018-01-14 LAB — CBC
HCT: 42.7 % (ref 36.0–46.0)
HEMOGLOBIN: 14.5 g/dL (ref 12.0–15.0)
MCH: 32 pg (ref 26.0–34.0)
MCHC: 34 g/dL (ref 30.0–36.0)
MCV: 94.3 fL (ref 78.0–100.0)
PLATELETS: 297 10*3/uL (ref 150–400)
RBC: 4.53 MIL/uL (ref 3.87–5.11)
RDW: 13 % (ref 11.5–15.5)
WBC: 16.7 10*3/uL — ABNORMAL HIGH (ref 4.0–10.5)

## 2018-01-14 LAB — I-STAT BETA HCG BLOOD, ED (MC, WL, AP ONLY)

## 2018-01-14 LAB — URINALYSIS, MICROSCOPIC (REFLEX): RBC / HPF: NONE SEEN RBC/hpf (ref 0–5)

## 2018-01-14 LAB — LIPASE, BLOOD: Lipase: 30 U/L (ref 11–51)

## 2018-01-14 MED ORDER — ONDANSETRON HCL 4 MG/2ML IJ SOLN
4.0000 mg | Freq: Once | INTRAMUSCULAR | Status: AC
  Administered 2018-01-14: 4 mg via INTRAVENOUS
  Filled 2018-01-14: qty 2

## 2018-01-14 MED ORDER — FAMOTIDINE PREMIXED 20-0.9 MG/50ML-% IV SOLN
20.0000 mg | Freq: Once | INTRAVENOUS | Status: AC
  Administered 2018-01-14: 20 mg via INTRAVENOUS
  Filled 2018-01-14: qty 50

## 2018-01-14 MED ORDER — POTASSIUM CHLORIDE 10 MEQ/100ML IV SOLN
10.0000 meq | INTRAVENOUS | Status: AC
  Administered 2018-01-14 – 2018-01-15 (×2): 10 meq via INTRAVENOUS
  Filled 2018-01-14 (×2): qty 100

## 2018-01-14 MED ORDER — SODIUM CHLORIDE 0.9 % IV BOLUS (SEPSIS)
1000.0000 mL | Freq: Once | INTRAVENOUS | Status: AC
  Administered 2018-01-14: 1000 mL via INTRAVENOUS

## 2018-01-14 MED ORDER — FAMOTIDINE 40 MG PO TABS: 40.0000 mg | ORAL_TABLET | Freq: Every day | ORAL | 3 refills | Status: DC

## 2018-01-14 MED ORDER — MORPHINE SULFATE (PF) 4 MG/ML IV SOLN
4.0000 mg | Freq: Once | INTRAVENOUS | Status: AC
  Administered 2018-01-14: 4 mg via INTRAVENOUS
  Filled 2018-01-14: qty 1

## 2018-01-14 MED ORDER — POTASSIUM CHLORIDE CRYS ER 20 MEQ PO TBCR
40.0000 meq | EXTENDED_RELEASE_TABLET | Freq: Once | ORAL | Status: AC
  Administered 2018-01-15: 40 meq via ORAL
  Filled 2018-01-14: qty 2

## 2018-01-14 MED ORDER — FAMOTIDINE 20 MG IN NS 100 ML IVPB: 20.0000 mg | Freq: Once | INTRAVENOUS | Status: DC

## 2018-01-14 NOTE — ED Triage Notes (Signed)
Pt is requesting to be re-evaluated for NVD and abdominal that has been ongoing for a week.
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
## 2018-01-14 NOTE — Discharge Instructions (Signed)
Your evaluated in the emergency department for excessive vomiting.  You were also found to have a low potassium.  You were hydrated and given potassium and your symptoms were improving.  We discussed that the cannabis smoking may be part of a factor in your symptoms.  You should follow-up with your doctor and we have also given you number for the health and wellness clinic.  You may end up needing another endoscopy.  We are prescribing you an antacid pill.

## 2018-01-14 NOTE — ED Notes (Signed)
Pt c/o n/v/d that has persisted since the last time she was here in the ED.

## 2018-01-17 ENCOUNTER — Other Ambulatory Visit: Payer: Self-pay

## 2018-01-17 ENCOUNTER — Emergency Department (HOSPITAL_COMMUNITY)
Admission: EM | Admit: 2018-01-17 | Discharge: 2018-01-17 | Disposition: A | Discharge status: Home / Self Care | Payer: Self-pay | Attending: Emergency Medicine | Admitting: Emergency Medicine

## 2018-01-17 ENCOUNTER — Encounter (HOSPITAL_COMMUNITY): Payer: Self-pay | Admitting: Emergency Medicine

## 2018-01-17 DIAGNOSIS — F1910 Other psychoactive substance abuse, uncomplicated: Secondary | ICD-10-CM | POA: Insufficient documentation

## 2018-01-17 DIAGNOSIS — E876 Hypokalemia: Secondary | ICD-10-CM | POA: Insufficient documentation

## 2018-01-17 DIAGNOSIS — Z79899 Other long term (current) drug therapy: Secondary | ICD-10-CM | POA: Insufficient documentation

## 2018-01-17 DIAGNOSIS — I10 Essential (primary) hypertension: Secondary | ICD-10-CM | POA: Insufficient documentation

## 2018-01-17 DIAGNOSIS — F17210 Nicotine dependence, cigarettes, uncomplicated: Secondary | ICD-10-CM | POA: Insufficient documentation

## 2018-01-17 DIAGNOSIS — R1115 Cyclical vomiting syndrome unrelated to migraine: Secondary | ICD-10-CM

## 2018-01-17 DIAGNOSIS — G43A0 Cyclical vomiting, not intractable: Secondary | ICD-10-CM | POA: Insufficient documentation

## 2018-01-17 LAB — COMPREHENSIVE METABOLIC PANEL
ALBUMIN: 4 g/dL (ref 3.5–5.0)
ALT: 17 U/L (ref 14–54)
ANION GAP: 13 (ref 5–15)
AST: 20 U/L (ref 15–41)
Alkaline Phosphatase: 54 U/L (ref 38–126)
BUN: 6 mg/dL (ref 6–20)
CO2: 24 mmol/L (ref 22–32)
Calcium: 9.1 mg/dL (ref 8.9–10.3)
Chloride: 103 mmol/L (ref 101–111)
Creatinine, Ser: 0.76 mg/dL (ref 0.44–1.00)
GFR calc Af Amer: >60 mL/min (ref >60)
GFR calc non Af Amer: >60 mL/min (ref >60)
GLUCOSE: 111 mg/dL — AB (ref 65–99)
POTASSIUM: 3 mmol/L — AB (ref 3.5–5.1)
SODIUM: 140 mmol/L (ref 135–145)
Total Bilirubin: 0.6 mg/dL (ref 0.3–1.2)
Total Protein: 7.1 g/dL (ref 6.5–8.1)

## 2018-01-17 LAB — URINALYSIS, ROUTINE W REFLEX MICROSCOPIC
Bilirubin Urine: NEGATIVE
Glucose, UA: NEGATIVE mg/dL
HGB URINE DIPSTICK: NEGATIVE
Ketones, ur: NEGATIVE mg/dL
Nitrite: NEGATIVE
Protein, ur: NEGATIVE mg/dL
Specific Gravity, Urine: 1.015 (ref 1.005–1.030)
pH: 6 (ref 5.0–8.0)

## 2018-01-17 LAB — CBC
HCT: 35.1 % — ABNORMAL LOW (ref 36.0–46.0)
HEMOGLOBIN: 11.8 g/dL — AB (ref 12.0–15.0)
MCH: 32.1 pg (ref 26.0–34.0)
MCHC: 33.6 g/dL (ref 30.0–36.0)
MCV: 95.4 fL (ref 78.0–100.0)
Platelets: 236 10*3/uL (ref 150–400)
RBC: 3.68 MIL/uL — ABNORMAL LOW (ref 3.87–5.11)
RDW: 13.2 % (ref 11.5–15.5)
WBC: 15 10*3/uL — ABNORMAL HIGH (ref 4.0–10.5)

## 2018-01-17 LAB — RAPID URINE DRUG SCREEN, HOSP PERFORMED
Amphetamines: NOT DETECTED
BENZODIAZEPINES: NOT DETECTED
Barbiturates: NOT DETECTED
Cocaine: POSITIVE — AB
OPIATES: NOT DETECTED
Tetrahydrocannabinol: POSITIVE — AB

## 2018-01-17 LAB — POC URINE PREG, ED: PREG TEST UR: NEGATIVE

## 2018-01-17 LAB — LIPASE, BLOOD: Lipase: 29 U/L (ref 11–51)

## 2018-01-17 MED ORDER — POTASSIUM CHLORIDE CRYS ER 20 MEQ PO TBCR
40.0000 meq | EXTENDED_RELEASE_TABLET | Freq: Once | ORAL | Status: AC
  Administered 2018-01-17: 40 meq via ORAL
  Filled 2018-01-17: qty 2

## 2018-01-17 MED ORDER — MORPHINE SULFATE (PF) 4 MG/ML IV SOLN
4.0000 mg | Freq: Once | INTRAVENOUS | Status: AC
  Administered 2018-01-17: 4 mg via INTRAVENOUS
  Filled 2018-01-17: qty 1

## 2018-01-17 MED ORDER — LACTATED RINGERS IV BOLUS (SEPSIS)
1000.0000 mL | Freq: Once | INTRAVENOUS | Status: AC
  Administered 2018-01-17: 1000 mL via INTRAVENOUS

## 2018-01-17 MED ORDER — HYDROCODONE-ACETAMINOPHEN 5-325 MG PO TABS
1.0000 | ORAL_TABLET | Freq: Once | ORAL | Status: AC
  Administered 2018-01-17: 1 via ORAL
  Filled 2018-01-17: qty 1

## 2018-01-17 MED ORDER — GI COCKTAIL ~~LOC~~
30.0000 mL | Freq: Once | ORAL | Status: AC
  Administered 2018-01-17: 30 mL via ORAL
  Filled 2018-01-17: qty 30

## 2018-01-17 MED ORDER — HALOPERIDOL LACTATE 5 MG/ML IJ SOLN
5.0000 mg | Freq: Once | INTRAMUSCULAR | Status: AC
  Administered 2018-01-17: 5 mg via INTRAVENOUS
  Filled 2018-01-17: qty 1
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
## 2018-01-17 NOTE — ED Triage Notes (Addendum)
Pt presents by EMS for evaluation of vomiting with no relief from prescription medications. Was seen on 01/14/18 for same. Pt reports having 15 episodes of vomiting for the last 24hr. Denies any diarrhea.

## 2018-01-17 NOTE — Discharge Instructions (Signed)
We are not a 100% sure as to what the cause of your symptoms is. As we discussed, the symptoms could be due to toxins use or it could be due to underlying medical problems.  Please see the [facility] wellness team for further care.  Refrain from substance abuse specifically cocaine.

## 2018-04-17 ENCOUNTER — Observation Stay (HOSPITAL_COMMUNITY)
Admission: EM | Admit: 2018-04-17 | Discharge: 2018-04-18 | Disposition: A | Discharge status: Home / Self Care | Payer: Self-pay | Attending: Family Medicine | Admitting: Family Medicine

## 2018-04-17 ENCOUNTER — Encounter (HOSPITAL_COMMUNITY): Payer: Self-pay

## 2018-04-17 DIAGNOSIS — Z720 Tobacco use: Secondary | ICD-10-CM | POA: Diagnosis present

## 2018-04-17 DIAGNOSIS — F1910 Other psychoactive substance abuse, uncomplicated: Secondary | ICD-10-CM | POA: Diagnosis present

## 2018-04-17 DIAGNOSIS — F17210 Nicotine dependence, cigarettes, uncomplicated: Secondary | ICD-10-CM | POA: Insufficient documentation

## 2018-04-17 DIAGNOSIS — K920 Hematemesis: Secondary | ICD-10-CM | POA: Diagnosis present

## 2018-04-17 DIAGNOSIS — D649 Anemia, unspecified: Secondary | ICD-10-CM | POA: Diagnosis present

## 2018-04-17 DIAGNOSIS — F3131 Bipolar disorder, current episode depressed, mild: Secondary | ICD-10-CM | POA: Diagnosis present

## 2018-04-17 DIAGNOSIS — R112 Nausea with vomiting, unspecified: Secondary | ICD-10-CM | POA: Diagnosis present

## 2018-04-17 DIAGNOSIS — F32A Depression, unspecified: Secondary | ICD-10-CM | POA: Diagnosis present

## 2018-04-17 DIAGNOSIS — R109 Unspecified abdominal pain: Secondary | ICD-10-CM | POA: Diagnosis present

## 2018-04-17 DIAGNOSIS — R197 Diarrhea, unspecified: Secondary | ICD-10-CM | POA: Diagnosis present

## 2018-04-17 DIAGNOSIS — R1084 Generalized abdominal pain: Secondary | ICD-10-CM

## 2018-04-17 DIAGNOSIS — F1010 Alcohol abuse, uncomplicated: Secondary | ICD-10-CM | POA: Diagnosis present

## 2018-04-17 DIAGNOSIS — Z79899 Other long term (current) drug therapy: Secondary | ICD-10-CM | POA: Insufficient documentation

## 2018-04-17 DIAGNOSIS — I10 Essential (primary) hypertension: Secondary | ICD-10-CM | POA: Diagnosis present

## 2018-04-17 DIAGNOSIS — F329 Major depressive disorder, single episode, unspecified: Secondary | ICD-10-CM | POA: Diagnosis present

## 2018-04-17 DIAGNOSIS — G43A1 Cyclical vomiting, intractable: Principal | ICD-10-CM | POA: Insufficient documentation

## 2018-04-17 DIAGNOSIS — F319 Bipolar disorder, unspecified: Secondary | ICD-10-CM | POA: Insufficient documentation

## 2018-04-17 DIAGNOSIS — F320 Major depressive disorder, single episode, mild: Secondary | ICD-10-CM

## 2018-04-17 DIAGNOSIS — K219 Gastro-esophageal reflux disease without esophagitis: Secondary | ICD-10-CM | POA: Insufficient documentation

## 2018-04-17 LAB — URINALYSIS, ROUTINE W REFLEX MICROSCOPIC
Bacteria, UA: NONE SEEN
Bilirubin Urine: NEGATIVE
Glucose, UA: NEGATIVE mg/dL
Hgb urine dipstick: NEGATIVE
KETONES UR: 5 mg/dL — AB
Leukocytes, UA: NEGATIVE
Nitrite: NEGATIVE
PH: 7 (ref 5.0–8.0)
PROTEIN: 100 mg/dL — AB
Specific Gravity, Urine: 1.02 (ref 1.005–1.030)

## 2018-04-17 LAB — COMPREHENSIVE METABOLIC PANEL
ALBUMIN: 4.7 g/dL (ref 3.5–5.0)
ALT: 21 U/L (ref 14–54)
AST: 27 U/L (ref 15–41)
Alkaline Phosphatase: 67 U/L (ref 38–126)
Anion gap: 13 (ref 5–15)
BUN: 8 mg/dL (ref 6–20)
CHLORIDE: 100 mmol/L — AB (ref 101–111)
CO2: 23 mmol/L (ref 22–32)
Calcium: 9.6 mg/dL (ref 8.9–10.3)
Creatinine, Ser: 0.88 mg/dL (ref 0.44–1.00)
GFR calc non Af Amer: >60 mL/min (ref >60)
Glucose, Bld: 127 mg/dL — ABNORMAL HIGH (ref 65–99)
Potassium: 3.6 mmol/L (ref 3.5–5.1)
SODIUM: 136 mmol/L (ref 135–145)
Total Bilirubin: 0.7 mg/dL (ref 0.3–1.2)
Total Protein: 8.5 g/dL — ABNORMAL HIGH (ref 6.5–8.1)

## 2018-04-17 LAB — PROTIME-INR
INR: 1.04
Prothrombin Time: 13.5 seconds (ref 11.4–15.2)

## 2018-04-17 LAB — HEMOGLOBIN AND HEMATOCRIT, BLOOD
HEMATOCRIT: 34.2 % — AB (ref 36.0–46.0)
HEMOGLOBIN: 11.4 g/dL — AB (ref 12.0–15.0)

## 2018-04-17 LAB — CBC
HCT: 38.2 % (ref 36.0–46.0)
HCT: 39.8 % (ref 36.0–46.0)
HEMOGLOBIN: 13.1 g/dL (ref 12.0–15.0)
Hemoglobin: 12.5 g/dL (ref 12.0–15.0)
MCH: 30.8 pg (ref 26.0–34.0)
MCH: 30.9 pg (ref 26.0–34.0)
MCHC: 32.7 g/dL (ref 30.0–36.0)
MCHC: 32.9 g/dL (ref 30.0–36.0)
MCV: 94.1 fL (ref 78.0–100.0)
MCV: 93.9 fL (ref 78.0–100.0)
PLATELETS: 235 10*3/uL (ref 150–400)
PLATELETS: ADEQUATE 10*3/uL (ref 150–400)
RBC: 4.06 MIL/uL (ref 3.87–5.11)
RBC: 4.24 MIL/uL (ref 3.87–5.11)
RDW: 13.2 % (ref 11.5–15.5)
RDW: 13.2 % (ref 11.5–15.5)
WBC: 13 10*3/uL — ABNORMAL HIGH (ref 4.0–10.5)
WBC: 14.4 10*3/uL — AB (ref 4.0–10.5)

## 2018-04-17 LAB — HCG, QUANTITATIVE, PREGNANCY: hCG, Beta Chain, Quant, S: 1 m[IU]/mL (ref <5)

## 2018-04-17 LAB — I-STAT BETA HCG BLOOD, ED (MC, WL, AP ONLY)

## 2018-04-17 LAB — RAPID URINE DRUG SCREEN, HOSP PERFORMED
Amphetamines: NOT DETECTED
Barbiturates: NOT DETECTED
Benzodiazepines: NOT DETECTED
Cocaine: POSITIVE — AB
OPIATES: NOT DETECTED
Tetrahydrocannabinol: POSITIVE — AB

## 2018-04-17 LAB — ABO/RH: ABO/RH(D): A POS

## 2018-04-17 LAB — LIPASE, BLOOD: Lipase: 22 U/L (ref 11–51)

## 2018-04-17 MED ORDER — NICOTINE 21 MG/24HR TD PT24
21.0000 mg | MEDICATED_PATCH | Freq: Every day | TRANSDERMAL | Status: DC
  Administered 2018-04-17 – 2018-04-18 (×2): 21 mg via TRANSDERMAL
  Filled 2018-04-17 (×2): qty 1

## 2018-04-17 MED ORDER — VITAMIN B-1 100 MG PO TABS
100.0000 mg | ORAL_TABLET | Freq: Every day | ORAL | Status: DC
  Administered 2018-04-18: 100 mg via ORAL
  Filled 2018-04-17: qty 1

## 2018-04-17 MED ORDER — OXYCODONE HCL 5 MG PO TABS
5.0000 mg | ORAL_TABLET | Freq: Four times a day (QID) | ORAL | Status: DC | PRN
Start: 2018-04-17 — End: 2018-04-18
  Administered 2018-04-17: 5 mg via ORAL
  Filled 2018-04-17: qty 1

## 2018-04-17 MED ORDER — AMLODIPINE BESYLATE 5 MG PO TABS
5.0000 mg | ORAL_TABLET | Freq: Every day | ORAL | Status: DC
  Administered 2018-04-17 – 2018-04-18 (×2): 5 mg via ORAL
  Filled 2018-04-17 (×2): qty 1

## 2018-04-17 MED ORDER — THIAMINE HCL 100 MG/ML IJ SOLN
100.0000 mg | Freq: Every day | INTRAMUSCULAR | Status: DC
  Administered 2018-04-17: 100 mg via INTRAVENOUS
  Filled 2018-04-17: qty 2

## 2018-04-17 MED ORDER — SODIUM CHLORIDE 0.9 % IV SOLN
INTRAVENOUS | Status: DC
  Administered 2018-04-17: 1000 mL via INTRAVENOUS
  Administered 2018-04-18: 12:00:00 via INTRAVENOUS
  Administered 2018-04-18: 1000 mL via INTRAVENOUS

## 2018-04-17 MED ORDER — SODIUM CHLORIDE 0.9 % IV BOLUS
1000.0000 mL | Freq: Once | INTRAVENOUS | Status: AC
  Administered 2018-04-17: 1000 mL via INTRAVENOUS

## 2018-04-17 MED ORDER — FOLIC ACID 1 MG PO TABS
1.0000 mg | ORAL_TABLET | Freq: Every day | ORAL | Status: DC
  Administered 2018-04-17 – 2018-04-18 (×2): 1 mg via ORAL
  Filled 2018-04-17 (×2): qty 1

## 2018-04-17 MED ORDER — PANTOPRAZOLE SODIUM 40 MG IV SOLR
40.0000 mg | Freq: Once | INTRAVENOUS | Status: AC
Start: 2018-04-17 — End: 2018-04-17
  Administered 2018-04-17: 40 mg via INTRAVENOUS
  Filled 2018-04-17: qty 40

## 2018-04-17 MED ORDER — HYDROXYZINE HCL 25 MG PO TABS
50.0000 mg | ORAL_TABLET | Freq: Two times a day (BID) | ORAL | Status: DC
  Administered 2018-04-17 – 2018-04-18 (×2): 50 mg via ORAL
  Filled 2018-04-17 (×2): qty 2

## 2018-04-17 MED ORDER — LORAZEPAM 2 MG/ML IJ SOLN: 1.0000 mg | Freq: Four times a day (QID) | INTRAMUSCULAR | Status: DC | PRN

## 2018-04-17 MED ORDER — ONDANSETRON HCL 4 MG/2ML IJ SOLN
4.0000 mg | Freq: Once | INTRAMUSCULAR | Status: AC
  Administered 2018-04-17: 4 mg via INTRAVENOUS
  Filled 2018-04-17: qty 2

## 2018-04-17 MED ORDER — MORPHINE SULFATE (PF) 4 MG/ML IV SOLN
4.0000 mg | Freq: Once | INTRAVENOUS | Status: AC
  Administered 2018-04-17: 4 mg via INTRAVENOUS
  Filled 2018-04-17: qty 1

## 2018-04-17 MED ORDER — ACETAMINOPHEN 325 MG PO TABS: 325.0000 mg | ORAL_TABLET | Freq: Four times a day (QID) | ORAL | Status: DC | PRN

## 2018-04-17 MED ORDER — METOCLOPRAMIDE HCL 5 MG/ML IJ SOLN
5.0000 mg | Freq: Three times a day (TID) | INTRAMUSCULAR | Status: DC
  Administered 2018-04-17 – 2018-04-18 (×2): 5 mg via INTRAVENOUS
  Filled 2018-04-17 (×2): qty 2

## 2018-04-17 MED ORDER — OLANZAPINE 7.5 MG PO TABS
15.0000 mg | ORAL_TABLET | Freq: Every day | ORAL | Status: DC
  Administered 2018-04-17: 15 mg via ORAL
  Filled 2018-04-17: qty 2

## 2018-04-17 MED ORDER — SODIUM CHLORIDE 0.9 % IV SOLN
8.0000 mg/h | INTRAVENOUS | Status: DC
  Administered 2018-04-17 – 2018-04-18 (×2): 8 mg/h via INTRAVENOUS
  Filled 2018-04-17 (×3): qty 80

## 2018-04-17 MED ORDER — ZOLPIDEM TARTRATE 5 MG PO TABS: 5.0000 mg | ORAL_TABLET | Freq: Every evening | ORAL | Status: DC | PRN

## 2018-04-17 MED ORDER — ONDANSETRON 4 MG PO TBDP
4.0000 mg | ORAL_TABLET | Freq: Once | ORAL | Status: AC | PRN
  Administered 2018-04-17: 4 mg via ORAL
  Filled 2018-04-17: qty 1

## 2018-04-17 MED ORDER — PANTOPRAZOLE SODIUM 40 MG IV SOLR: 40.0000 mg | Freq: Two times a day (BID) | INTRAVENOUS | Status: DC

## 2018-04-17 MED ORDER — LORAZEPAM 1 MG PO TABS
1.0000 mg | ORAL_TABLET | Freq: Four times a day (QID) | ORAL | Status: DC | PRN
  Administered 2018-04-17: 1 mg via ORAL
  Filled 2018-04-17: qty 1

## 2018-04-17 MED ORDER — LORAZEPAM 2 MG/ML IJ SOLN: 0.0000 mg | Freq: Four times a day (QID) | INTRAMUSCULAR | Status: DC

## 2018-04-17 MED ORDER — HYDRALAZINE HCL 20 MG/ML IJ SOLN: 5.0000 mg | INTRAMUSCULAR | Status: DC | PRN

## 2018-04-17 MED ORDER — LORAZEPAM 2 MG/ML IJ SOLN: 0.0000 mg | Freq: Two times a day (BID) | INTRAMUSCULAR | Status: DC

## 2018-04-17 MED ORDER — ONDANSETRON 4 MG PO TBDP: 4.0000 mg | ORAL_TABLET | Freq: Three times a day (TID) | ORAL | Status: DC | PRN

## 2018-04-17 MED ORDER — ADULT MULTIVITAMIN W/MINERALS CH
1.0000 | ORAL_TABLET | Freq: Every day | ORAL | Status: DC
  Administered 2018-04-17 – 2018-04-18 (×2): 1 via ORAL
  Filled 2018-04-17 (×2): qty 1

## 2018-04-17 NOTE — ED Notes (Signed)
Patient ambulatory to bathroom with steady gait at this time 
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
## 2018-04-17 NOTE — ED Notes (Signed)
Admitting at bedside 

## 2018-04-17 NOTE — ED Notes (Signed)
Pt states she has been vomiting blood in the lobby

## 2018-04-17 NOTE — ED Notes (Signed)
Pt given a cup of water to sip on. Pt started drinking water at 1745, pt has had no nausea/emesis since drinking the water.
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
## 2018-04-17 NOTE — ED Triage Notes (Signed)
Pt presents for evaluation of generalized abd pain with N/V/D today. Pt reports intermittent cold chills.

## 2018-04-18 ENCOUNTER — Other Ambulatory Visit: Payer: Self-pay

## 2018-04-18 DIAGNOSIS — I10 Essential (primary) hypertension: Secondary | ICD-10-CM

## 2018-04-18 DIAGNOSIS — G43A0 Cyclical vomiting, not intractable: Secondary | ICD-10-CM

## 2018-04-18 LAB — CBC
HCT: 35.8 % — ABNORMAL LOW (ref 36.0–46.0)
HEMATOCRIT: 33.9 % — AB (ref 36.0–46.0)
HEMATOCRIT: 33.9 % — AB (ref 36.0–46.0)
Hemoglobin: 11.3 g/dL — ABNORMAL LOW (ref 12.0–15.0)
Hemoglobin: 11.8 g/dL — ABNORMAL LOW (ref 12.0–15.0)
Hemoglobin: 11.2 g/dL — ABNORMAL LOW (ref 12.0–15.0)
MCH: 31.2 pg (ref 26.0–34.0)
MCH: 30.6 pg (ref 26.0–34.0)
MCH: 30.9 pg (ref 26.0–34.0)
MCHC: 33.3 g/dL (ref 30.0–36.0)
MCHC: 33 g/dL (ref 30.0–36.0)
MCHC: 33 g/dL (ref 30.0–36.0)
MCV: 93.6 fL (ref 78.0–100.0)
MCV: 93 fL (ref 78.0–100.0)
MCV: 93.4 fL (ref 78.0–100.0)
PLATELETS: 195 10*3/uL (ref 150–400)
PLATELETS: 192 10*3/uL (ref 150–400)
Platelets: 204 10*3/uL (ref 150–400)
RBC: 3.62 MIL/uL — ABNORMAL LOW (ref 3.87–5.11)
RBC: 3.85 MIL/uL — ABNORMAL LOW (ref 3.87–5.11)
RBC: 3.63 MIL/uL — ABNORMAL LOW (ref 3.87–5.11)
RDW: 13 % (ref 11.5–15.5)
RDW: 13.3 % (ref 11.5–15.5)
RDW: 13.4 % (ref 11.5–15.5)
WBC: 13.6 10*3/uL — AB (ref 4.0–10.5)
WBC: 14.2 10*3/uL — ABNORMAL HIGH (ref 4.0–10.5)
WBC: 12.3 10*3/uL — AB (ref 4.0–10.5)

## 2018-04-18 LAB — GASTROINTESTINAL PANEL BY PCR, STOOL (REPLACES STOOL CULTURE)

## 2018-04-18 LAB — HIV ANTIBODY (ROUTINE TESTING W REFLEX): HIV SCREEN 4TH GENERATION: NONREACTIVE

## 2018-04-18 LAB — BASIC METABOLIC PANEL
Anion gap: 10 (ref 5–15)
BUN: 8 mg/dL (ref 6–20)
CALCIUM: 8.3 mg/dL — AB (ref 8.9–10.3)
CO2: 22 mmol/L (ref 22–32)
CREATININE: 0.8 mg/dL (ref 0.44–1.00)
Chloride: 105 mmol/L (ref 101–111)
GFR calc Af Amer: >60 mL/min (ref >60)
GLUCOSE: 88 mg/dL (ref 65–99)
POTASSIUM: 3.1 mmol/L — AB (ref 3.5–5.1)
SODIUM: 137 mmol/L (ref 135–145)

## 2018-04-18 LAB — TYPE AND SCREEN
ABO/RH(D): A POS
ANTIBODY SCREEN: NEGATIVE

## 2018-04-18 LAB — OCCULT BLOOD X 1 CARD TO LAB, STOOL: FECAL OCCULT BLD: NEGATIVE

## 2018-04-18 MED ORDER — PROMETHAZINE HCL 25 MG RE SUPP: 25.0000 mg | Freq: Four times a day (QID) | RECTAL | 3 refills | Status: DC | PRN

## 2018-04-18 MED ORDER — LORAZEPAM 2 MG/ML IJ SOLN: 0.0000 mg | Freq: Four times a day (QID) | INTRAMUSCULAR | Status: DC

## 2018-04-18 MED ORDER — LORAZEPAM 2 MG/ML IJ SOLN: 0.0000 mg | Freq: Two times a day (BID) | INTRAMUSCULAR | Status: DC

## 2018-04-18 NOTE — Progress Notes (Signed)
Taylor Bartlett to be D/C'd Home per MD order.  Discussed prescriptions and follow up appointments with the patient. Prescriptions given to patient, medication list explained in detail. Pt verbalized understanding.  Allergies as of 04/18/2018      Reactions   Tomato Anaphylaxis, Itching   Other Nausea And Vomiting   Lettuce- visit to ed      Medication List    TAKE these medications   acetaminophen 325 MG tablet Commonly known as:  TYLENOL Take 325 mg by mouth every 6 (six) hours as needed for mild pain.   famotidine 40 MG tablet Commonly known as:  PEPCID Take 1 tablet (40 mg total) by mouth daily.   hydrOXYzine 50 MG tablet Commonly known as:  ATARAX/VISTARIL Take 50 mg by mouth 2 (two) times daily.   ibuprofen 200 MG tablet Commonly known as:  ADVIL,MOTRIN Take 400 mg by mouth every 6 (six) hours as needed for moderate pain.   metoCLOPramide 10 MG tablet Commonly known as:  REGLAN Take 1 tablet (10 mg total) by mouth every 8 (eight) hours as needed for nausea or vomiting.   OLANZapine 15 MG tablet Commonly known as:  ZYPREXA Take 15 mg by mouth at bedtime.   ondansetron 4 MG tablet Commonly known as:  ZOFRAN Take 1 tablet (4 mg total) by mouth every 6 (six) hours as needed for nausea or vomiting.   promethazine 25 MG suppository Commonly known as:  PHENERGAN Place 1 suppository (25 mg total) rectally every 6 (six) hours as needed for nausea or vomiting.       Vitals:   04/18/18 0841 04/18/18 1326  BP: (!) 135/92 (!) 135/92  Pulse: 71 71  Resp: 20 20  Temp: 98.4 F (36.9 C) 98.4 F (36.9 C)  SpO2: 100%     Skin clean, dry and intact without evidence of skin break down, no evidence of skin tears noted. IV catheter discontinued intact. Site without signs and symptoms of complications. Dressing and pressure applied. Pt denies pain at this time. No complaints noted.  An After Visit Summary was printed and given to the patient. Patient escorted via WC, and  D/C home via private auto.  Britt Bolognese RN, BSN

## 2018-04-18 NOTE — Progress Notes (Signed)
Patient tolerated soft diet. No complaints of N/V/D or abdominal pain. MD notified.

## 2018-04-18 NOTE — Discharge Summary (Signed)
Physician Discharge Summary  CHIZARAM LATINO ZOX:[id number] DOB: [birth year] DOA: 04/17/2018  PCP: Patient, No Pcp Per  Admit date: 04/17/2018 Discharge date: 04/18/2018  Admitted From: Home  Disposition:  Home   Recommendations for Outpatient Follow-up:  1. Follow up with PCP in 1-2 weeks   Home Health: None  Equipment/Devices: None  Discharge Condition: Good  CODE STATUS: FULL Diet recommendation: Regular  Brief/Interim Summary: Taylor Bartlett is a 32 y.o. female with medical history significant of polysubstance abuse (cocaine, marijuana, alcohol, EtOH abuse), cyclic vomiting syndrome, bipolar depression, gastroparesis, GERD, hypertension, who presents with intractable nausea, vomiting, diarrhea, abdominal pain, hematemesis.  Pt states that she has been having severe nausea, vomiting, diarrhea, abdominal pain, hematemesis since this AM. She has vomited more than 20 time, with dark coffee-ground bloody materials abdominal pain, which is located in the central abdomen, constant. She also has 10 out of 10 severity, sharp, nonradiating.  It is not aggravated or alleviated by any known factors. She stats that has been having diarrhea, with 4 watery stool bowel movement.  She has chills, but no fever.  Denies chest pain, shortness of breath, dizziness, lightheadedness.  No bloody stool. No cough, symptoms of UTI or unilateral weakness.  Patient use ibuprofen sometimes.  Of note, patient had EGD with biopsy by Armbruster on 08/18/2017 which showed gastropathy/chemical gastritis.       Cyclic vomiting syndrome Patient was admitted, treated with fluids, anti-emetics.  Her nausea and abdominal pain resolved.  In the morning, she felt no more nausea or pain.  Her diet was advanced and she was without symptoms.  Etiology and clinical course of cyclic vomiting syndrome was reviewed.  She has adequate supply of metoclopramide.  Her phenergan suppositories were refilled.    Reported  hematemesis The patient reported melena and bright red hematemesis during her last hospitalization last year.  At that time she underwent endsocopy that showed focal injury to the gastric fundus from vomiting, no other ulcer, gastritis.  Brushings of esophageal plaques was negative for candidiasis, and she had negative testing for H pylori.  During this hospitalization, she again reported pink tinge to her vomit after repeated retching.  She also had mild anemia noted.  However, her stool overnight was normal in color and tested negative for occult blood.  She had no other clinical bleeding, and in the morning her hemoglobin was increased to 11.8 g/dL.  She likely had Mallory-weiss tear or retching induced bleeding again, have very low suspicion for GI bleeding.  No further work up necessary.            Discharge Diagnoses:  Principal Problem:   Hematemesis Active Problems:   Depression   Polysubstance abuse (HCC)   Bipolar affective disorder, currently depressed, mild (HCC)   Abdominal pain   Normocytic anemia   Alcohol abuse   Tobacco abuse   Nausea vomiting and diarrhea   Essential hypertension    Discharge Instructions  Discharge Instructions    Diet - low sodium heart healthy   Complete by:  As directed    Discharge instructions   Complete by:  As directed    From Dr. Maryfrances Bunnell: You were admitted for vomiting.  Probably this is from "cyclic vomiting syndrome".  You should search for this online, to see if you can find support groups or forums where people talk about helpful techniques to control their symptoms.  You should also search the web for information about "cannabinoid hyperemesis" syndrome.  I have refilled your  Phenergan suppositories to the [facility] on [address].  You should have a primary care doctor if you don't have one already.  Two local clinics that provide care for those without insurance on a sliding scale include the [facility] through [place] and the [facility]: [facility] [phone] [facility] on [address]. [place]. [phone]   Increase activity slowly   Complete by:  As directed      Allergies as of 04/18/2018      Reactions   Tomato Anaphylaxis, Itching   Other Nausea And Vomiting   Lettuce- visit to ed      Medication List    TAKE these medications   acetaminophen 325 MG tablet Commonly known as:  TYLENOL Take 325 mg by mouth every 6 (six) hours as needed for mild pain.   famotidine 40 MG tablet Commonly known as:  PEPCID Take 1 tablet (40 mg total) by mouth daily.   hydrOXYzine 50 MG tablet Commonly known as:  ATARAX/VISTARIL Take 50 mg by mouth 2 (two) times daily.   ibuprofen 200 MG tablet Commonly known as:  ADVIL,MOTRIN Take 400 mg by mouth every 6 (six) hours as needed for moderate pain.   metoCLOPramide 10 MG tablet Commonly known as:  REGLAN Take 1 tablet (10 mg total) by mouth every 8 (eight) hours as needed for nausea or vomiting.   OLANZapine 15 MG tablet Commonly known as:  ZYPREXA Take 15 mg by mouth at bedtime.   ondansetron 4 MG tablet Commonly known as:  ZOFRAN Take 1 tablet (4 mg total) by mouth every 6 (six) hours as needed for nausea or vomiting.   promethazine 25 MG suppository Commonly known as:  PHENERGAN Place 1 suppository (25 mg total) rectally every 6 (six) hours as needed for nausea or vomiting.       Allergies  Allergen Reactions  . Tomato Anaphylaxis and Itching  . Other Nausea And Vomiting    Lettuce- visit to ed    Consultations:  GI, informally by phone   Procedures/Studies:  No results found.    Subjective: Feels completely better.  No abdominal pain or vomiting or nausea.  No melena overnight.  No more vomiting overnight nor hematemesis.  No dizziness with standing, no weakness.  Discharge Exam: Vitals:   04/18/18 0841 04/18/18 1326  BP: (!) 135/92 (!) 135/92  Pulse: 71  71  Resp: 20 20  Temp: 98.4 F (36.9 C) 98.4 F (36.9 C)  SpO2: 100%    Vitals:   04/17/18 2109 04/18/18 0557 04/18/18 0841 04/18/18 1326  BP: (!) 167/87 120/76 (!) 135/92 (!) 135/92  Pulse: 70 75 71 71  Resp: Temp:  98 F (36.7 C) 98.4 F (36.9 C) 98.4 F (36.9 C)  TempSrc:  Oral Oral Oral  SpO2: 100% 100% 100%   Weight:    63.5 kg (140 lb)  Height:     (1.575 m)    General: Pt is alert, awake, not in acute distress Cardiovascular: RRR, S1/S2 +, no rubs, no gallops Respiratory: CTA bilaterally, no wheezing, no rhonchi Abdominal: Soft, NT, ND, bowel sounds + Extremities: no edema, no cyanosis    The results of significant diagnostics from this hospitalization (including imaging, microbiology, ancillary and laboratory) are listed below for reference.     Microbiology: No results found for this or any previous visit (from the past 240 hour(s)).   Labs:  BNP (last 3 results) No results for input(s): BNP in the last 8760 hours. Basic Metabolic Panel: Recent Labs  Lab 04/17/18 1314 04/18/18 0354  NA 136 137  K 3.6 3.1*  CL 100* 105  CO2 23 22  GLUCOSE 127* 88  BUN 8 8  CREATININE 0.88 0.80  CALCIUM 9.6 8.3*   Liver Function Tests: Recent Labs  Lab 04/17/18 1314  AST 27  ALT 21  ALKPHOS 67  BILITOT 0.7  PROT 8.5*  ALBUMIN 4.7   Recent Labs  Lab 04/17/18 1314  LIPASE 22   No results for input(s): AMMONIA in the last 168 hours. CBC: Recent Labs  Lab 04/17/18 1314 04/17/18 1844 04/17/18 2135 04/18/18 0354 04/18/18 0827  WBC 13.0*  --  14.4* 13.6* 14.2*  HGB 12.5 11.4* 13.1 11.3* 11.8*  HCT 38.2 34.2* 39.8 33.9* 35.8*  MCV 94.1  --  93.9 93.6 93.0  PLT 235  --  PLATELET CLUMPS NOTED ON SMEAR, COUNT APPEARS ADEQUATE 195 204   Cardiac Enzymes: No results for input(s): CKTOTAL, CKMB, CKMBINDEX, TROPONINI in the last 168 hours. BNP: Invalid input(s): POCBNP CBG: No results for input(s): GLUCAP in the last 168  hours. D-Dimer No results for input(s): DDIMER in the last 72 hours. Hgb A1c No results for input(s): HGBA1C in the last 72 hours. Lipid Profile No results for input(s): CHOL, HDL, LDLCALC, TRIG, CHOLHDL, LDLDIRECT in the last 72 hours. Thyroid function studies No results for input(s): TSH, T4TOTAL, T3FREE, THYROIDAB in the last 72 hours.  Invalid input(s): FREET3 Anemia work up No results for input(s): VITAMINB12, FOLATE, FERRITIN, TIBC, IRON, RETICCTPCT in the last 72 hours. Urinalysis    Component Value Date/Time   COLORURINE YELLOW 04/17/2018 1630   APPEARANCEUR HAZY (A) 04/17/2018 1630   LABSPEC 1.020 04/17/2018 1630   PHURINE 7.0 04/17/2018 1630   GLUCOSEU NEGATIVE 04/17/2018 1630   HGBUR NEGATIVE 04/17/2018 1630   BILIRUBINUR NEGATIVE 04/17/2018 1630   KETONESUR 5 (A) 04/17/2018 1630   PROTEINUR 100 (A) 04/17/2018 1630   UROBILINOGEN 0.2 09/22/2015 1729   NITRITE NEGATIVE 04/17/2018 1630   LEUKOCYTESUR NEGATIVE 04/17/2018 1630   Sepsis Labs Invalid input(s): PROCALCITONIN,  WBC,  LACTICIDVEN Microbiology No results found for this or any previous visit (from the past 240 hour(s)).   Time coordinating discharge: 30 minutes       SIGNED:   Alberteen Sam, MD  [facility] 04/18/2018, 1:35 PM

## 2018-04-18 NOTE — Progress Notes (Signed)
Arrival Method: Patient arrived in stretcher from ED. Mental Orientation: alert Telemetry: Assessment: See Doc Flow sheets. Skin: Warm, dry and intact. IV: Peripheral   Right a/c Pain:.6/10 Fall Prevention Safety Plan: Patient educated about fall prevention safety plan, understood and acknowledged. Admission Screening5100 Orientation: Patient has been oriented to the unit, staff and to the room.

## 2018-04-18 NOTE — Progress Notes (Signed)
Patient tolerating clear liquids well. Diet advanced to soft. Will continue to monitor.

## 2018-04-27 ENCOUNTER — Emergency Department (HOSPITAL_COMMUNITY)
Admission: EM | Admit: 2018-04-27 | Discharge: 2018-04-27 | Disposition: A | Discharge status: Home / Self Care | Payer: Self-pay | Attending: Emergency Medicine | Admitting: Emergency Medicine

## 2018-04-27 ENCOUNTER — Other Ambulatory Visit: Payer: Self-pay

## 2018-04-27 ENCOUNTER — Encounter (HOSPITAL_COMMUNITY): Payer: Self-pay

## 2018-04-27 DIAGNOSIS — Z5321 Procedure and treatment not carried out due to patient leaving prior to being seen by health care provider: Secondary | ICD-10-CM | POA: Insufficient documentation

## 2018-04-27 DIAGNOSIS — I10 Essential (primary) hypertension: Secondary | ICD-10-CM | POA: Insufficient documentation

## 2018-04-27 NOTE — ED Notes (Signed)
No answer for room X3 

## 2018-04-27 NOTE — ED Triage Notes (Signed)
Pt reports that she checked her BP at [facility] and it was high and they told her to come here, does not take BP meds.

## 2018-04-27 NOTE — ED Notes (Signed)
Unable to locate pt in waiting area 

## 2019-01-06 ENCOUNTER — Emergency Department (HOSPITAL_COMMUNITY)
Admission: EM | Admit: 2019-01-06 | Discharge: 2019-01-06 | Disposition: A | Discharge status: Home / Self Care | Payer: BLUE CROSS/BLUE SHIELD | Attending: Emergency Medicine | Admitting: Emergency Medicine

## 2019-01-06 ENCOUNTER — Other Ambulatory Visit: Payer: Self-pay

## 2019-01-06 ENCOUNTER — Encounter (HOSPITAL_COMMUNITY): Payer: Self-pay | Admitting: Emergency Medicine

## 2019-01-06 DIAGNOSIS — F1490 Cocaine use, unspecified, uncomplicated: Secondary | ICD-10-CM | POA: Diagnosis not present

## 2019-01-06 DIAGNOSIS — R197 Diarrhea, unspecified: Secondary | ICD-10-CM | POA: Diagnosis not present

## 2019-01-06 DIAGNOSIS — F17210 Nicotine dependence, cigarettes, uncomplicated: Secondary | ICD-10-CM | POA: Insufficient documentation

## 2019-01-06 DIAGNOSIS — F319 Bipolar disorder, unspecified: Secondary | ICD-10-CM | POA: Insufficient documentation

## 2019-01-06 DIAGNOSIS — R112 Nausea with vomiting, unspecified: Secondary | ICD-10-CM | POA: Insufficient documentation

## 2019-01-06 DIAGNOSIS — R109 Unspecified abdominal pain: Secondary | ICD-10-CM | POA: Insufficient documentation

## 2019-01-06 DIAGNOSIS — F1290 Cannabis use, unspecified, uncomplicated: Secondary | ICD-10-CM | POA: Insufficient documentation

## 2019-01-06 DIAGNOSIS — I10 Essential (primary) hypertension: Secondary | ICD-10-CM | POA: Insufficient documentation

## 2019-01-06 DIAGNOSIS — Z79899 Other long term (current) drug therapy: Secondary | ICD-10-CM | POA: Diagnosis not present

## 2019-01-06 LAB — URINALYSIS, ROUTINE W REFLEX MICROSCOPIC
Bilirubin Urine: NEGATIVE
GLUCOSE, UA: NEGATIVE mg/dL
KETONES UR: 20 mg/dL — AB
Leukocytes,Ua: NEGATIVE
Nitrite: POSITIVE — AB
Protein, ur: 30 mg/dL — AB
Specific Gravity, Urine: 1.025 (ref 1.005–1.030)
pH: 6 (ref 5.0–8.0)

## 2019-01-06 LAB — I-STAT TROPONIN, ED: TROPONIN I, POC: 0 ng/mL (ref 0.00–0.08)

## 2019-01-06 LAB — COMPREHENSIVE METABOLIC PANEL
ALBUMIN: 4.4 g/dL (ref 3.5–5.0)
ALT: 20 U/L (ref 0–44)
ANION GAP: 12 (ref 5–15)
AST: 31 U/L (ref 15–41)
Alkaline Phosphatase: 56 U/L (ref 38–126)
BILIRUBIN TOTAL: 0.6 mg/dL (ref 0.3–1.2)
BUN: 8 mg/dL (ref 6–20)
CO2: 23 mmol/L (ref 22–32)
Calcium: 9.2 mg/dL (ref 8.9–10.3)
Chloride: 102 mmol/L (ref 98–111)
Creatinine, Ser: 1.04 mg/dL — ABNORMAL HIGH (ref 0.44–1.00)
GFR calc Af Amer: >60 mL/min (ref >60)
GFR calc non Af Amer: >60 mL/min (ref >60)
GLUCOSE: 194 mg/dL — AB (ref 70–99)
POTASSIUM: 3.8 mmol/L (ref 3.5–5.1)
SODIUM: 137 mmol/L (ref 135–145)
TOTAL PROTEIN: 8.4 g/dL — AB (ref 6.5–8.1)

## 2019-01-06 LAB — CBC
HEMATOCRIT: 38.4 % (ref 36.0–46.0)
HEMOGLOBIN: 12.5 g/dL (ref 12.0–15.0)
MCH: 32 pg (ref 26.0–34.0)
MCHC: 32.6 g/dL (ref 30.0–36.0)
MCV: 98.2 fL (ref 80.0–100.0)
Platelets: 237 10*3/uL (ref 150–400)
RBC: 3.91 MIL/uL (ref 3.87–5.11)
RDW: 13.4 % (ref 11.5–15.5)
WBC: 13.8 10*3/uL — ABNORMAL HIGH (ref 4.0–10.5)
nRBC: 0 % (ref 0.0–0.2)

## 2019-01-06 LAB — LIPASE, BLOOD: Lipase: 22 U/L (ref 11–51)

## 2019-01-06 MED ORDER — SCOPOLAMINE 1 MG/3DAYS TD PT72
1.0000 | MEDICATED_PATCH | TRANSDERMAL | Status: DC
  Administered 2019-01-06: 1.5 mg via TRANSDERMAL
  Filled 2019-01-06: qty 1

## 2019-01-06 MED ORDER — ONDANSETRON 4 MG PO TBDP
4.0000 mg | ORAL_TABLET | Freq: Once | ORAL | Status: AC | PRN
  Administered 2019-01-06: 4 mg via ORAL
  Filled 2019-01-06: qty 1

## 2019-01-06 MED ORDER — PROMETHAZINE HCL 25 MG RE SUPP: 25.0000 mg | Freq: Four times a day (QID) | RECTAL | 0 refills | Status: DC | PRN

## 2019-01-06 MED ORDER — HYDROCODONE-ACETAMINOPHEN 5-325 MG PO TABS
1.0000 | ORAL_TABLET | Freq: Once | ORAL | Status: AC
  Administered 2019-01-06: 1 via ORAL
  Filled 2019-01-06: qty 1

## 2019-01-06 MED ORDER — HALOPERIDOL LACTATE 5 MG/ML IJ SOLN
5.0000 mg | Freq: Once | INTRAMUSCULAR | Status: AC
  Administered 2019-01-06: 5 mg via INTRAVENOUS
  Filled 2019-01-06: qty 1

## 2019-01-06 MED ORDER — METOCLOPRAMIDE HCL 10 MG PO TABS: 10.0000 mg | ORAL_TABLET | Freq: Three times a day (TID) | ORAL | 0 refills | Status: DC | PRN

## 2019-01-06 MED ORDER — SODIUM CHLORIDE 0.9 % IV BOLUS
1000.0000 mL | Freq: Once | INTRAVENOUS | Status: AC
  Administered 2019-01-06: 1000 mL via INTRAVENOUS

## 2019-01-06 MED ORDER — DIPHENHYDRAMINE HCL 50 MG/ML IJ SOLN
25.0000 mg | Freq: Once | INTRAMUSCULAR | Status: AC
  Administered 2019-01-06: 25 mg via INTRAVENOUS
  Filled 2019-01-06: qty 1

## 2019-01-06 MED ORDER — METOCLOPRAMIDE HCL 5 MG/ML IJ SOLN
10.0000 mg | Freq: Once | INTRAMUSCULAR | Status: AC
  Administered 2019-01-06: 10 mg via INTRAVENOUS
  Filled 2019-01-06: qty 2

## 2019-01-06 MED ORDER — SODIUM CHLORIDE 0.9% FLUSH
3.0000 mL | Freq: Once | INTRAVENOUS | Status: AC
  Administered 2019-01-06: 3 mL via INTRAVENOUS

## 2019-01-06 MED ORDER — FAMOTIDINE 40 MG PO TABS: 40.0000 mg | ORAL_TABLET | Freq: Every day | ORAL | 3 refills | Status: DC

## 2019-01-06 NOTE — ED Notes (Signed)
Patient verbalizes understanding of discharge instructions. Opportunity for questioning and answers were provided. Armband removed by staff, pt discharged from ED.  
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
## 2019-01-06 NOTE — Discharge Instructions (Addendum)
Follow-up with your primary care doctor/wellness center

## 2019-01-06 NOTE — ED Triage Notes (Signed)
Pt had n/v/d earlier in the week that resolved. Returned today. Tolerating no po intake. No fever or chills. No abdominal pain

## 2019-06-10 ENCOUNTER — Emergency Department (HOSPITAL_COMMUNITY)
Admission: EM | Admit: 2019-06-10 | Discharge: 2019-06-11 | Disposition: A | Discharge status: Home / Self Care | Payer: BC Managed Care – PPO | Source: Home / Self Care | Attending: Emergency Medicine | Admitting: Emergency Medicine

## 2019-06-10 ENCOUNTER — Emergency Department (HOSPITAL_COMMUNITY)
Admission: EM | Admit: 2019-06-10 | Discharge: 2019-06-10 | Disposition: A | Discharge status: Home / Self Care | Payer: BC Managed Care – PPO | Attending: Emergency Medicine | Admitting: Emergency Medicine

## 2019-06-10 ENCOUNTER — Encounter (HOSPITAL_COMMUNITY): Payer: Self-pay | Admitting: Emergency Medicine

## 2019-06-10 ENCOUNTER — Other Ambulatory Visit: Payer: Self-pay

## 2019-06-10 DIAGNOSIS — I10 Essential (primary) hypertension: Secondary | ICD-10-CM | POA: Diagnosis not present

## 2019-06-10 DIAGNOSIS — Z79899 Other long term (current) drug therapy: Secondary | ICD-10-CM | POA: Insufficient documentation

## 2019-06-10 DIAGNOSIS — F12188 Cannabis abuse with other cannabis-induced disorder: Secondary | ICD-10-CM | POA: Diagnosis not present

## 2019-06-10 DIAGNOSIS — F17210 Nicotine dependence, cigarettes, uncomplicated: Secondary | ICD-10-CM | POA: Insufficient documentation

## 2019-06-10 DIAGNOSIS — F1290 Cannabis use, unspecified, uncomplicated: Secondary | ICD-10-CM

## 2019-06-10 DIAGNOSIS — R109 Unspecified abdominal pain: Secondary | ICD-10-CM | POA: Insufficient documentation

## 2019-06-10 DIAGNOSIS — F12988 Cannabis use, unspecified with other cannabis-induced disorder: Secondary | ICD-10-CM | POA: Insufficient documentation

## 2019-06-10 DIAGNOSIS — R112 Nausea with vomiting, unspecified: Secondary | ICD-10-CM | POA: Diagnosis present

## 2019-06-10 LAB — COMPREHENSIVE METABOLIC PANEL
ALT: 19 U/L (ref 0–44)
AST: 32 U/L (ref 15–41)
Albumin: 4.7 g/dL (ref 3.5–5.0)
Alkaline Phosphatase: 63 U/L (ref 38–126)
Anion gap: 12 (ref 5–15)
BUN: 7 mg/dL (ref 6–20)
CO2: 23 mmol/L (ref 22–32)
Calcium: 10.1 mg/dL (ref 8.9–10.3)
Chloride: 105 mmol/L (ref 98–111)
Creatinine, Ser: 1.07 mg/dL — ABNORMAL HIGH (ref 0.44–1.00)
GFR calc Af Amer: >60 mL/min (ref >60)
GFR calc non Af Amer: >60 mL/min (ref >60)
Glucose, Bld: 134 mg/dL — ABNORMAL HIGH (ref 70–99)
Potassium: 3.8 mmol/L (ref 3.5–5.1)
Sodium: 140 mmol/L (ref 135–145)
Total Bilirubin: 0.8 mg/dL (ref 0.3–1.2)
Total Protein: 8.2 g/dL — ABNORMAL HIGH (ref 6.5–8.1)

## 2019-06-10 LAB — CBC WITH DIFFERENTIAL/PLATELET
Abs Immature Granulocytes: 0.03 10*3/uL (ref 0.00–0.07)
Basophils Absolute: 0 10*3/uL (ref 0.0–0.1)
Basophils Relative: 0 %
Eosinophils Absolute: 0.2 10*3/uL (ref 0.0–0.5)
Eosinophils Relative: 1 %
HCT: 40.4 % (ref 36.0–46.0)
Hemoglobin: 13.1 g/dL (ref 12.0–15.0)
Immature Granulocytes: 0 %
Lymphocytes Relative: 27 %
Lymphs Abs: 3.1 10*3/uL (ref 0.7–4.0)
MCH: 31.7 pg (ref 26.0–34.0)
MCHC: 32.4 g/dL (ref 30.0–36.0)
MCV: 97.8 fL (ref 80.0–100.0)
Monocytes Absolute: 0.5 10*3/uL (ref 0.1–1.0)
Monocytes Relative: 4 %
Neutro Abs: 7.7 10*3/uL (ref 1.7–7.7)
Neutrophils Relative %: 68 %
Platelets: 263 10*3/uL (ref 150–400)
RBC: 4.13 MIL/uL (ref 3.87–5.11)
RDW: 13.3 % (ref 11.5–15.5)
WBC: 11.5 10*3/uL — ABNORMAL HIGH (ref 4.0–10.5)
nRBC: 0 % (ref 0.0–0.2)

## 2019-06-10 LAB — LIPASE, BLOOD: Lipase: 28 U/L (ref 11–51)

## 2019-06-10 MED ORDER — ONDANSETRON HCL 4 MG/2ML IJ SOLN
4.0000 mg | Freq: Once | INTRAMUSCULAR | Status: AC
  Administered 2019-06-10: 14:00:00 4 mg via INTRAVENOUS
  Filled 2019-06-10: qty 2

## 2019-06-10 MED ORDER — HALOPERIDOL LACTATE 5 MG/ML IJ SOLN
5.0000 mg | Freq: Once | INTRAMUSCULAR | Status: AC
  Administered 2019-06-10: 5 mg via INTRAVENOUS
  Filled 2019-06-10: qty 1

## 2019-06-10 MED ORDER — SODIUM CHLORIDE 0.9 % IV BOLUS
1000.0000 mL | Freq: Once | INTRAVENOUS | Status: AC
  Administered 2019-06-10: 1000 mL via INTRAVENOUS

## 2019-06-10 MED ORDER — MORPHINE SULFATE (PF) 4 MG/ML IV SOLN
4.0000 mg | Freq: Once | INTRAVENOUS | Status: AC
  Administered 2019-06-10: 16:00:00 4 mg via INTRAVENOUS
  Filled 2019-06-10: qty 1

## 2019-06-10 MED ORDER — PROMETHAZINE HCL 25 MG PO TABS: 25.0000 mg | ORAL_TABLET | Freq: Four times a day (QID) | ORAL | 0 refills | Status: DC | PRN

## 2019-06-10 NOTE — ED Notes (Signed)
Patient given drink. 

## 2019-06-10 NOTE — ED Notes (Signed)
Asked thew pt for a urine sample, pt states she only had to have bowel movements.

## 2019-06-10 NOTE — ED Triage Notes (Signed)
Pt presents by [facility] for reported emesis. Pt was seen at [facility] and discharged at 58 for treatment of cannabinoid hyperemesis. EMS reports pt was having dry heaving during transport. Labs and medications were given at [facility].

## 2019-06-10 NOTE — ED Triage Notes (Signed)
Pt states she began throwing up yesterday but it stopped. Pt began throwing up this am around 1000. Pt currently throwing up and is diaphoretic.

## 2019-06-10 NOTE — ED Notes (Signed)
Patient verbalizes understanding of discharge instructions. Opportunity for questioning and answers were provided. Armband removed by staff, pt discharged from ED.  
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
## 2019-06-11 LAB — RAPID URINE DRUG SCREEN, HOSP PERFORMED
Amphetamines: POSITIVE — AB
Barbiturates: NOT DETECTED
Benzodiazepines: NOT DETECTED
Cocaine: POSITIVE — AB
Opiates: POSITIVE — AB
Tetrahydrocannabinol: POSITIVE — AB

## 2019-06-11 LAB — URINALYSIS, ROUTINE W REFLEX MICROSCOPIC
Bilirubin Urine: NEGATIVE
Glucose, UA: NEGATIVE mg/dL
Ketones, ur: 20 mg/dL — AB
Nitrite: NEGATIVE
Protein, ur: 30 mg/dL — AB
Specific Gravity, Urine: 1.023 (ref 1.005–1.030)
pH: 5 (ref 5.0–8.0)

## 2019-06-11 MED ORDER — SULFAMETHOXAZOLE-TRIMETHOPRIM 800-160 MG PO TABS: 1.0000 | ORAL_TABLET | Freq: Two times a day (BID) | ORAL | 0 refills | Status: AC

## 2019-06-11 MED ORDER — PROMETHAZINE HCL 25 MG RE SUPP: 25.0000 mg | Freq: Four times a day (QID) | RECTAL | 0 refills | Status: DC | PRN

## 2019-06-11 MED ORDER — HALOPERIDOL LACTATE 5 MG/ML IJ SOLN
5.0000 mg | Freq: Once | INTRAMUSCULAR | Status: AC
  Administered 2019-06-11: 5 mg via INTRAMUSCULAR
  Filled 2019-06-11: qty 1
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
## 2019-06-11 NOTE — Discharge Instructions (Signed)

## 2019-06-13 LAB — URINE CULTURE: Culture: >=100000 — AB

## 2019-06-14 ENCOUNTER — Telehealth: Payer: Self-pay

## 2019-06-14 NOTE — Telephone Encounter (Signed)
Post ED Visit - Positive Culture Follow-up  Culture report reviewed by antimicrobial stewardship pharmacist: [place] Team []  Elenor Quinones, Pharm.D. []  Heide Guile, Pharm.D., BCPS AQ-ID []  Parks Neptune, Pharm.D., BCPS []  Alycia Rossetti, Pharm.D., BCPS []  [place], Pharm.D., BCPS, AAHIVP []  Legrand Como, Pharm.D., BCPS, AAHIVP []  Salome Arnt, PharmD, BCPS []  Johnnette Gourd, PharmD, BCPS []  Hughes Better, PharmD, BCPS []  Leeroy Cha, PharmD []  Laqueta Linden, PharmD, BCPS []  Albertina Parr, PharmD  [place] Team []  Leodis Sias, PharmD []  Lindell Spar, PharmD []  Royetta Asal, PharmD []  Graylin Shiver, Rph []  Rema Fendt) Glennon Mac, PharmD []  Arlyn Dunning, PharmD []  Netta Cedars, PharmD [x]  Dia Sitter, PharmD []  Leone Haven, PharmD []  Gretta Arab, PharmD []  Theodis Shove, PharmD []  Peggyann Juba, PharmD []  Reuel Boom, PharmD   Positive urine culture Treated with Bactrim DS, organism sensitive to the same and no further patient follow-up is required at this time.  Genia Del 06/14/2019, 9:41 AM

## 2020-07-02 ENCOUNTER — Encounter (HOSPITAL_COMMUNITY): Payer: Self-pay | Admitting: Emergency Medicine

## 2020-07-02 ENCOUNTER — Other Ambulatory Visit: Payer: Self-pay

## 2020-07-02 ENCOUNTER — Ambulatory Visit (HOSPITAL_COMMUNITY)
Admission: EM | Admit: 2020-07-02 | Discharge: 2020-07-02 | Disposition: A | Discharge status: Home / Self Care | Payer: BC Managed Care – PPO

## 2020-07-02 DIAGNOSIS — Z20822 Contact with and (suspected) exposure to covid-19: Secondary | ICD-10-CM

## 2020-07-02 NOTE — ED Triage Notes (Signed)
Patient presents to [facility] for assessment after her live in partner tested positive for COVID yesterday.  Wanting testing.  Denies symptoms.
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
## 2020-07-02 NOTE — Discharge Instructions (Addendum)
Monitor mychart for results 

## 2020-07-08 ENCOUNTER — Ambulatory Visit (HOSPITAL_COMMUNITY)
Admission: EM | Admit: 2020-07-08 | Discharge: 2020-07-08 | Disposition: A | Discharge status: Home / Self Care | Payer: Self-pay | Attending: Internal Medicine | Admitting: Internal Medicine

## 2020-07-08 DIAGNOSIS — Z20822 Contact with and (suspected) exposure to covid-19: Secondary | ICD-10-CM | POA: Insufficient documentation

## 2020-07-08 LAB — SARS CORONAVIRUS 2 (TAT 6-24 HRS): SARS Coronavirus 2: NEGATIVE

## 2020-07-08 NOTE — ED Triage Notes (Signed)
Patient returned for COVID test retake

## 2020-07-18 ENCOUNTER — Emergency Department (HOSPITAL_COMMUNITY): Admission: EM | Admit: 2020-07-18 | Discharge: 2020-07-18 | Discharge status: Against Medical Advice | Payer: Self-pay

## 2020-07-18 ENCOUNTER — Other Ambulatory Visit: Payer: Self-pay

## 2020-07-18 NOTE — ED Notes (Signed)
I was told pt left

## 2020-07-19 ENCOUNTER — Encounter (HOSPITAL_COMMUNITY): Payer: Self-pay

## 2020-07-19 ENCOUNTER — Other Ambulatory Visit: Payer: Self-pay

## 2020-07-19 DIAGNOSIS — E876 Hypokalemia: Secondary | ICD-10-CM | POA: Diagnosis present

## 2020-07-19 DIAGNOSIS — Z20822 Contact with and (suspected) exposure to covid-19: Secondary | ICD-10-CM | POA: Diagnosis present

## 2020-07-19 DIAGNOSIS — I10 Essential (primary) hypertension: Secondary | ICD-10-CM | POA: Diagnosis present

## 2020-07-19 DIAGNOSIS — F3131 Bipolar disorder, current episode depressed, mild: Secondary | ICD-10-CM | POA: Diagnosis present

## 2020-07-19 DIAGNOSIS — R112 Nausea with vomiting, unspecified: Secondary | ICD-10-CM | POA: Diagnosis present

## 2020-07-19 DIAGNOSIS — Z825 Family history of asthma and other chronic lower respiratory diseases: Secondary | ICD-10-CM

## 2020-07-19 DIAGNOSIS — F12288 Cannabis dependence with other cannabis-induced disorder: Principal | ICD-10-CM | POA: Diagnosis present

## 2020-07-19 DIAGNOSIS — Z79899 Other long term (current) drug therapy: Secondary | ICD-10-CM

## 2020-07-19 DIAGNOSIS — Z8249 Family history of ischemic heart disease and other diseases of the circulatory system: Secondary | ICD-10-CM

## 2020-07-19 DIAGNOSIS — E860 Dehydration: Secondary | ICD-10-CM | POA: Diagnosis present

## 2020-07-19 DIAGNOSIS — Z8261 Family history of arthritis: Secondary | ICD-10-CM

## 2020-07-19 DIAGNOSIS — F1910 Other psychoactive substance abuse, uncomplicated: Secondary | ICD-10-CM | POA: Diagnosis present

## 2020-07-19 NOTE — ED Triage Notes (Signed)
Arrived POV from home. patient reports nausea and vomiting that started Thursday.

## 2020-07-20 ENCOUNTER — Emergency Department (HOSPITAL_COMMUNITY): Payer: Self-pay

## 2020-07-20 ENCOUNTER — Inpatient Hospital Stay (HOSPITAL_COMMUNITY)
Admission: EM | Admit: 2020-07-20 | Discharge: 2020-07-21 | DRG: 897 | Disposition: A | Discharge status: Home / Self Care | Payer: Self-pay | Attending: Family Medicine | Admitting: Family Medicine

## 2020-07-20 DIAGNOSIS — E860 Dehydration: Secondary | ICD-10-CM

## 2020-07-20 DIAGNOSIS — F1910 Other psychoactive substance abuse, uncomplicated: Secondary | ICD-10-CM | POA: Diagnosis present

## 2020-07-20 DIAGNOSIS — F3131 Bipolar disorder, current episode depressed, mild: Secondary | ICD-10-CM | POA: Diagnosis present

## 2020-07-20 DIAGNOSIS — F12288 Cannabis dependence with other cannabis-induced disorder: Principal | ICD-10-CM

## 2020-07-20 DIAGNOSIS — F1220 Cannabis dependence, uncomplicated: Secondary | ICD-10-CM | POA: Diagnosis present

## 2020-07-20 DIAGNOSIS — I10 Essential (primary) hypertension: Secondary | ICD-10-CM

## 2020-07-20 DIAGNOSIS — E876 Hypokalemia: Secondary | ICD-10-CM

## 2020-07-20 LAB — URINALYSIS, ROUTINE W REFLEX MICROSCOPIC
Bilirubin Urine: NEGATIVE
Glucose, UA: NEGATIVE mg/dL
Ketones, ur: 5 mg/dL — AB
Nitrite: NEGATIVE
Protein, ur: 30 mg/dL — AB
Specific Gravity, Urine: <1.005 — ABNORMAL LOW (ref 1.005–1.030)
pH: 6 (ref 5.0–8.0)

## 2020-07-20 LAB — CBC
HCT: 36.2 % (ref 36.0–46.0)
HCT: 39.9 % (ref 36.0–46.0)
Hemoglobin: 12.2 g/dL (ref 12.0–15.0)
Hemoglobin: 13.7 g/dL (ref 12.0–15.0)
MCH: 32.3 pg (ref 26.0–34.0)
MCH: 32.4 pg (ref 26.0–34.0)
MCHC: 33.7 g/dL (ref 30.0–36.0)
MCHC: 34.3 g/dL (ref 30.0–36.0)
MCV: 94.1 fL (ref 80.0–100.0)
MCV: 96 fL (ref 80.0–100.0)
Platelets: 209 10*3/uL (ref 150–400)
Platelets: 262 10*3/uL (ref 150–400)
RBC: 3.77 MIL/uL — ABNORMAL LOW (ref 3.87–5.11)
RBC: 4.24 MIL/uL (ref 3.87–5.11)
RDW: 13.2 % (ref 11.5–15.5)
RDW: 13.2 % (ref 11.5–15.5)
WBC: 14.2 10*3/uL — ABNORMAL HIGH (ref 4.0–10.5)
WBC: 21.9 10*3/uL — ABNORMAL HIGH (ref 4.0–10.5)
nRBC: 0 % (ref 0.0–0.2)
nRBC: 0 % (ref 0.0–0.2)

## 2020-07-20 LAB — COMPREHENSIVE METABOLIC PANEL
ALT: 30 U/L (ref 0–44)
AST: 40 U/L (ref 15–41)
Albumin: 5.5 g/dL — ABNORMAL HIGH (ref 3.5–5.0)
Alkaline Phosphatase: 66 U/L (ref 38–126)
Anion gap: 19 — ABNORMAL HIGH (ref 5–15)
BUN: 17 mg/dL (ref 6–20)
CO2: 22 mmol/L (ref 22–32)
Calcium: 10 mg/dL (ref 8.9–10.3)
Chloride: 94 mmol/L — ABNORMAL LOW (ref 98–111)
Creatinine, Ser: 1.13 mg/dL — ABNORMAL HIGH (ref 0.44–1.00)
GFR calc Af Amer: >60 mL/min (ref >60)
GFR calc non Af Amer: >60 mL/min (ref >60)
Glucose, Bld: 119 mg/dL — ABNORMAL HIGH (ref 70–99)
Potassium: 2.5 mmol/L — CL (ref 3.5–5.1)
Sodium: 135 mmol/L (ref 135–145)
Total Bilirubin: 0.7 mg/dL (ref 0.3–1.2)
Total Protein: 9.5 g/dL — ABNORMAL HIGH (ref 6.5–8.1)

## 2020-07-20 LAB — BASIC METABOLIC PANEL
Anion gap: 11 (ref 5–15)
BUN: 13 mg/dL (ref 6–20)
CO2: 24 mmol/L (ref 22–32)
Calcium: 8.4 mg/dL — ABNORMAL LOW (ref 8.9–10.3)
Chloride: 98 mmol/L (ref 98–111)
Creatinine, Ser: 0.77 mg/dL (ref 0.44–1.00)
GFR calc Af Amer: >60 mL/min (ref >60)
GFR calc non Af Amer: >60 mL/min (ref >60)
Glucose, Bld: 113 mg/dL — ABNORMAL HIGH (ref 70–99)
Potassium: 2.9 mmol/L — ABNORMAL LOW (ref 3.5–5.1)
Sodium: 133 mmol/L — ABNORMAL LOW (ref 135–145)

## 2020-07-20 LAB — I-STAT BETA HCG BLOOD, ED (MC, WL, AP ONLY): I-stat hCG, quantitative: <5 m[IU]/mL (ref <5)

## 2020-07-20 LAB — LACTIC ACID, PLASMA
Lactic Acid, Venous: 1.1 mmol/L (ref 0.5–1.9)
Lactic Acid, Venous: 0.8 mmol/L (ref 0.5–1.9)

## 2020-07-20 LAB — SARS CORONAVIRUS 2 BY RT PCR (HOSPITAL ORDER, PERFORMED IN ~~LOC~~ HOSPITAL LAB): SARS Coronavirus 2: NEGATIVE

## 2020-07-20 LAB — MAGNESIUM: Magnesium: 2 mg/dL (ref 1.7–2.4)

## 2020-07-20 LAB — LIPASE, BLOOD: Lipase: 22 U/L (ref 11–51)

## 2020-07-20 LAB — HIV ANTIBODY (ROUTINE TESTING W REFLEX): HIV Screen 4th Generation wRfx: NONREACTIVE

## 2020-07-20 LAB — CREATININE, SERUM
Creatinine, Ser: 0.81 mg/dL (ref 0.44–1.00)
GFR calc Af Amer: >60 mL/min (ref >60)
GFR calc non Af Amer: >60 mL/min (ref >60)

## 2020-07-20 MED ORDER — SODIUM CHLORIDE 0.9 % IV SOLN: INTRAVENOUS | Status: DC

## 2020-07-20 MED ORDER — PROCHLORPERAZINE EDISYLATE 10 MG/2ML IJ SOLN: 10.0000 mg | INTRAMUSCULAR | Status: DC | PRN

## 2020-07-20 MED ORDER — FAMOTIDINE 20 MG PO TABS
40.0000 mg | ORAL_TABLET | Freq: Every day | ORAL | Status: DC
  Administered 2020-07-21: 40 mg via ORAL
  Filled 2020-07-20 (×2): qty 2

## 2020-07-20 MED ORDER — IOHEXOL 300 MG/ML  SOLN
100.0000 mL | Freq: Once | INTRAMUSCULAR | Status: AC | PRN
  Administered 2020-07-20: 100 mL via INTRAVENOUS

## 2020-07-20 MED ORDER — ONDANSETRON 8 MG PO TBDP
8.0000 mg | ORAL_TABLET | Freq: Once | ORAL | Status: AC
  Administered 2020-07-20: 8 mg via ORAL
  Filled 2020-07-20: qty 1

## 2020-07-20 MED ORDER — HYDROXYZINE HCL 50 MG PO TABS
50.0000 mg | ORAL_TABLET | Freq: Two times a day (BID) | ORAL | Status: DC
  Administered 2020-07-20 – 2020-07-21 (×2): 50 mg via ORAL
  Filled 2020-07-20 (×2): qty 1

## 2020-07-20 MED ORDER — METOCLOPRAMIDE HCL 5 MG/ML IJ SOLN
10.0000 mg | Freq: Four times a day (QID) | INTRAMUSCULAR | Status: DC
  Administered 2020-07-20 – 2020-07-21 (×3): 10 mg via INTRAVENOUS
  Filled 2020-07-20 (×3): qty 2

## 2020-07-20 MED ORDER — SODIUM CHLORIDE 0.9 % IV BOLUS
1000.0000 mL | Freq: Once | INTRAVENOUS | Status: AC
  Administered 2020-07-20: 1000 mL via INTRAVENOUS

## 2020-07-20 MED ORDER — LORAZEPAM 2 MG/ML IJ SOLN
1.0000 mg | Freq: Once | INTRAMUSCULAR | Status: AC
  Administered 2020-07-20: 1 mg via INTRAVENOUS
  Filled 2020-07-20: qty 1

## 2020-07-20 MED ORDER — POTASSIUM CHLORIDE 10 MEQ/100ML IV SOLN
10.0000 meq | INTRAVENOUS | Status: AC
  Administered 2020-07-20 (×3): 10 meq via INTRAVENOUS
  Filled 2020-07-20 (×3): qty 100

## 2020-07-20 MED ORDER — ONDANSETRON HCL 4 MG/2ML IJ SOLN: 4.0000 mg | Freq: Four times a day (QID) | INTRAMUSCULAR | Status: DC | PRN

## 2020-07-20 MED ORDER — OLANZAPINE 5 MG PO TABS
15.0000 mg | ORAL_TABLET | Freq: Every day | ORAL | Status: DC
  Administered 2020-07-20: 15 mg via ORAL
  Filled 2020-07-20: qty 3

## 2020-07-20 MED ORDER — ACETAMINOPHEN 650 MG RE SUPP: 650.0000 mg | Freq: Four times a day (QID) | RECTAL | Status: DC | PRN

## 2020-07-20 MED ORDER — MAGNESIUM SULFATE 2 GM/50ML IV SOLN
2.0000 g | Freq: Once | INTRAVENOUS | Status: AC
  Administered 2020-07-20: 2 g via INTRAVENOUS
  Filled 2020-07-20: qty 50

## 2020-07-20 MED ORDER — ENOXAPARIN SODIUM 40 MG/0.4ML ~~LOC~~ SOLN
40.0000 mg | SUBCUTANEOUS | Status: DC
  Filled 2020-07-20: qty 0.4

## 2020-07-20 MED ORDER — POTASSIUM CHLORIDE CRYS ER 20 MEQ PO TBCR
40.0000 meq | EXTENDED_RELEASE_TABLET | Freq: Once | ORAL | Status: AC
  Administered 2020-07-20: 40 meq via ORAL
  Filled 2020-07-20: qty 2

## 2020-07-20 MED ORDER — ACETAMINOPHEN 325 MG PO TABS: 650.0000 mg | ORAL_TABLET | Freq: Four times a day (QID) | ORAL | Status: DC | PRN

## 2020-07-20 MED ORDER — SODIUM CHLORIDE (PF) 0.9 % IJ SOLN
INTRAMUSCULAR | Status: AC
  Filled 2020-07-20: qty 50

## 2020-07-20 NOTE — ED Provider Notes (Signed)
Patient in waiting room, call received from lab of critical low potassium of 2.5.  Unfortunately, no ED beds available.  We will give ondansetron oral dissolving tablet and oral potassium.   Dione Booze, MD 07/20/20 [phone]

## 2020-07-20 NOTE — ED Notes (Signed)
Date and time results received: 07/20/20 12:35 AM  (use smartphrase ".now" to insert current time)  Test: Potassium  Critical Value: 2.5  Name of Provider Notified: Dr.Glick  Orders Received? Or Actions Taken?:

## 2020-07-20 NOTE — Plan of Care (Signed)
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
## 2020-07-20 NOTE — ED Notes (Signed)
No answer x2 

## 2020-07-21 ENCOUNTER — Encounter (HOSPITAL_COMMUNITY): Payer: Self-pay | Admitting: Internal Medicine

## 2020-07-21 ENCOUNTER — Encounter: Payer: Self-pay | Admitting: Family Medicine

## 2020-07-21 LAB — COMPREHENSIVE METABOLIC PANEL
ALT: 22 U/L (ref 0–44)
AST: 29 U/L (ref 15–41)
Albumin: 3.6 g/dL (ref 3.5–5.0)
Alkaline Phosphatase: 48 U/L (ref 38–126)
Anion gap: 9 (ref 5–15)
BUN: 11 mg/dL (ref 6–20)
CO2: 23 mmol/L (ref 22–32)
Calcium: 8.3 mg/dL — ABNORMAL LOW (ref 8.9–10.3)
Chloride: 105 mmol/L (ref 98–111)
Creatinine, Ser: 0.74 mg/dL (ref 0.44–1.00)
GFR calc Af Amer: >60 mL/min (ref >60)
GFR calc non Af Amer: >60 mL/min (ref >60)
Glucose, Bld: 93 mg/dL (ref 70–99)
Potassium: 2.7 mmol/L — CL (ref 3.5–5.1)
Sodium: 137 mmol/L (ref 135–145)
Total Bilirubin: 0.6 mg/dL (ref 0.3–1.2)
Total Protein: 6.6 g/dL (ref 6.5–8.1)

## 2020-07-21 LAB — BASIC METABOLIC PANEL
Anion gap: 6 (ref 5–15)
BUN: 9 mg/dL (ref 6–20)
CO2: 23 mmol/L (ref 22–32)
Calcium: 8 mg/dL — ABNORMAL LOW (ref 8.9–10.3)
Chloride: 109 mmol/L (ref 98–111)
Creatinine, Ser: 0.7 mg/dL (ref 0.44–1.00)
GFR calc Af Amer: >60 mL/min (ref >60)
GFR calc non Af Amer: >60 mL/min (ref >60)
Glucose, Bld: 97 mg/dL (ref 70–99)
Potassium: 3.7 mmol/L (ref 3.5–5.1)
Sodium: 138 mmol/L (ref 135–145)

## 2020-07-21 LAB — CBC
HCT: 34.6 % — ABNORMAL LOW (ref 36.0–46.0)
Hemoglobin: 11.6 g/dL — ABNORMAL LOW (ref 12.0–15.0)
MCH: 32.8 pg (ref 26.0–34.0)
MCHC: 33.5 g/dL (ref 30.0–36.0)
MCV: 97.7 fL (ref 80.0–100.0)
Platelets: 194 10*3/uL (ref 150–400)
RBC: 3.54 MIL/uL — ABNORMAL LOW (ref 3.87–5.11)
RDW: 13.2 % (ref 11.5–15.5)
WBC: 10.3 10*3/uL (ref 4.0–10.5)
nRBC: 0 % (ref 0.0–0.2)

## 2020-07-21 LAB — MAGNESIUM: Magnesium: 2.7 mg/dL — ABNORMAL HIGH (ref 1.7–2.4)

## 2020-07-21 MED ORDER — POTASSIUM CHLORIDE CRYS ER 20 MEQ PO TBCR
40.0000 meq | EXTENDED_RELEASE_TABLET | Freq: Once | ORAL | Status: AC
  Administered 2020-07-21: 40 meq via ORAL
  Filled 2020-07-21: qty 2

## 2020-07-21 MED ORDER — SODIUM CHLORIDE 0.9 % IV SOLN: INTRAVENOUS | Status: DC

## 2020-07-21 MED ORDER — ADULT MULTIVITAMIN W/MINERALS CH
1.0000 | ORAL_TABLET | Freq: Every day | ORAL | Status: DC
  Administered 2020-07-21: 1 via ORAL
  Filled 2020-07-21: qty 1

## 2020-07-21 MED ORDER — POTASSIUM CHLORIDE IN NACL 40-0.9 MEQ/L-% IV SOLN
INTRAVENOUS | Status: DC
  Filled 2020-07-21 (×2): qty 1000

## 2020-07-21 MED ORDER — ENSURE ENLIVE PO LIQD
237.0000 mL | Freq: Two times a day (BID) | ORAL | Status: DC
  Administered 2020-07-21: 237 mL via ORAL

## 2020-07-21 MED ORDER — POTASSIUM CHLORIDE 10 MEQ/100ML IV SOLN
10.0000 meq | INTRAVENOUS | Status: AC
  Administered 2020-07-21 (×3): 10 meq via INTRAVENOUS
  Filled 2020-07-21 (×3): qty 100

## 2020-07-21 MED ORDER — NICOTINE 14 MG/24HR TD PT24
14.0000 mg | MEDICATED_PATCH | Freq: Every day | TRANSDERMAL | Status: DC
  Administered 2020-07-21: 14 mg via TRANSDERMAL
  Filled 2020-07-21: qty 1

## 2020-07-21 NOTE — Progress Notes (Signed)
Reviewed AVS paperwork and medications with pt. IV removed WNL. Instructed pt to follow up with PCP. Pt verbalizes understanding with no questions at this time. Pt assisted off unit via wheelchair NAD.

## 2020-07-21 NOTE — Discharge Summary (Signed)
Physician Discharge Summary  JAKERRIA KINGBIRD LZJ:[id number] DOB: 29-Apr-1986 DOA: 07/20/2020  PCP: Patient, No Pcp Per  Admit date: 07/20/2020 Discharge date: 07/21/2020  Time spent: 25 minutes  Recommendations for Outpatient Follow-up:  1. Needs outpatient follow-up for labs in about a week 2. Hold lisinopril HCTZ at this time and likely can resume in about 1 to 2 weeks 3. Stop cannabis  Discharge Diagnoses:  Principal Problem:   Cannabis hyperemesis syndrome concurrent with and due to cannabis dependence ([place]) Active Problems:   Hypokalemia   Polysubstance abuse ([place])   Bipolar affective disorder, currently depressed, mild ([place])   Dehydration   Essential hypertension   Discharge Condition: Improved  Diet recommendation: Heart healthy  Filed Weights   07/19/20 2327 07/19/20 2336  Weight: 59 kg 59 kg    History of present illness:  02.IOX Known cyclical vomiting syndrome Prior EtOH, bipolar gastroparesis reflux HTN Multiple ED visits over the past several months Treated recently 7/20 urinary tract infection from the ED she tolerated IM Haldol and a p.o. challenge and was sent home with antibiotics    Represented 07/02/2020 with Covid exposure but sent home without issue  Represented again 07/20/2020 nausea vomiting diarrhea White count 21 potassium 2.5 creatinine above baseline 1.0-1.13  She was hospitalized her white count came down with hydration and without treatment of any infectious etiology and none was suspected and stabilized at 10 Her potassium was quite low on admission 2.5 range and she had an AKI with BUN/creatinine of 17/1.13 all of which resolved quite quickly with volume repletion multiple rounds of IV potassium and oral p.o. potassium replacement She was asking for diet on the morning that I saw her and was tolerating fluids quite well her diet was graduated to regular diet which she tolerated without any event and she was discharged home in a stable state  and met maximal hospital benefit  She does not need potassium replacement but does need to hold her HCTZ which can cause the electrolytes to be abnormal and she can resume this when she gets labs in the outpatient setting  Discharge Exam: Vitals:   07/21/20 0053 07/21/20 0447  BP: 129/82 113/86  Pulse: 72 74  Resp: 16 20  Temp: 98.5 F (36.9 C) 98.4 F (36.9 C)  SpO2: 100% 100%    General: Awake alert coherent no distress Cardiovascular: S1-S2 no murmur Respiratory: Clear no added sound Abdomen soft nontender  Discharge Instructions   Discharge Instructions    Diet - low sodium heart healthy   Complete by: As directed    Discharge instructions   Complete by: As directed    Stop your blood pressure medications temporarily as you will need repeat labs prior to resuming your HCTZ I would say once you can get some labs in about 1 week you can resume them in the interim just monitor your pressures Please try to quit smoking marijuana as this can cause cyclical vomiting Would strongly recommend that you see your PCP in about a week Use Zofran as needed You do not need any potassium replacement on discharge as you are pretty stable Please seek again primary care in about a week   Increase activity slowly   Complete by: As directed      Allergies as of 07/21/2020      Reactions   Tomato Anaphylaxis, Itching   Other Nausea And Vomiting   Lettuce- visit to ed      Medication List    STOP taking these  medications   losartan-hydrochlorothiazide 100-12.5 MG tablet Commonly known as: HYZAAR   metoCLOPramide 10 MG tablet Commonly known as: REGLAN   promethazine 25 MG suppository Commonly known as: PHENERGAN     TAKE these medications   acetaminophen 325 MG tablet Commonly known as: TYLENOL Take 325 mg by mouth every 6 (six) hours as needed for mild pain.   famotidine 40 MG tablet Commonly known as: PEPCID Take 1 tablet (40 mg total) by mouth daily.   hydrOXYzine 50  MG tablet Commonly known as: ATARAX/VISTARIL Take 50 mg by mouth 2 (two) times daily.   OLANZapine 15 MG tablet Commonly known as: ZYPREXA Take 15 mg by mouth at bedtime.   omeprazole 20 MG capsule Commonly known as: PRILOSEC Take 20 mg by mouth daily.   ondansetron 4 MG tablet Commonly known as: ZOFRAN Take 1 tablet (4 mg total) by mouth every 6 (six) hours as needed for nausea or vomiting.      Allergies  Allergen Reactions  . Tomato Anaphylaxis and Itching  . Other Nausea And Vomiting    Lettuce- visit to ed      The results of significant diagnostics from this hospitalization (including imaging, microbiology, ancillary and laboratory) are listed below for reference.    Significant Diagnostic Studies: CT ABDOMEN PELVIS W CONTRAST  Result Date: 07/20/2020 CLINICAL DATA:  Nausea and vomiting starting Thursday. EXAM: CT ABDOMEN AND PELVIS WITH CONTRAST TECHNIQUE: Multidetector CT imaging of the abdomen and pelvis was performed using the standard protocol following bolus administration of intravenous contrast. CONTRAST:  165m OMNIPAQUE IOHEXOL 300 MG/ML  SOLN COMPARISON:  CT abdomen pelvis 03/22/2017 FINDINGS: Lower chest: No acute abnormality. Hepatobiliary: No focal liver abnormality is seen. Gallbladder unremarkable. Pancreas: Unremarkable. No pancreatic ductal dilatation or surrounding inflammatory changes. Spleen: Normal in size without focal abnormality. Adrenals/Urinary Tract: Adrenal glands are unremarkable. Kidneys are normal, without renal calculi, focal lesion, or hydronephrosis. Bladder is unremarkable. Stomach/Bowel: Stomach is within normal limits. Appendix appears normal. No evidence of bowel wall thickening, distention, or inflammatory changes. Vascular/Lymphatic: No significant vascular findings are present. No enlarged abdominal or pelvic lymph nodes. Reproductive: Uterus and bilateral adnexa are unremarkable. Other: No abdominal wall hernia or abnormality. No  abdominopelvic ascites. Musculoskeletal: No acute or significant osseous findings. IMPRESSION: No acute findings in the abdomen or pelvis. Electronically Signed   By: NAudie PintoM.D.   On: 07/20/2020 12:15    Microbiology: Recent Results (from the past 240 hour(s))  SARS Coronavirus 2 by RT PCR ([facility] Nasopharyngeal Nasopharyngeal Swab     Status: None   Collection Time: 07/20/20  7:29 AM   Specimen: Nasopharyngeal Swab  Result Value Ref Range Status   SARS Coronavirus 2 NEGATIVE NEGATIVE Final    Comment: (NOTE) SARS-CoV-2 target nucleic acids are NOT DETECTED.  The SARS-CoV-2 RNA is generally detectable in upper and lower respiratory specimens during the acute phase of infection. The lowest concentration of SARS-CoV-2 viral copies this assay can detect is 250 copies / mL. A negative result does not preclude SARS-CoV-2 infection and should not be used as the sole basis for treatment or other patient management decisions.  A negative result may occur with improper specimen collection / handling, submission of specimen other than nasopharyngeal swab, presence of viral mutation(s) within the areas targeted by this assay, and inadequate number of viral copies (<250 copies / mL). A negative result must be combined with clinical observations, patient history, and epidemiological information.  Fact Sheet for Patients:   StrictlyIdeas.no  Fact Sheet for Healthcare Providers: BankingDealers.co.za  This test is not yet approved or  cleared by the [place] FDA and has been authorized for detection and/or diagnosis of SARS-CoV-2 by FDA under an Emergency Use Authorization (EUA).  This EUA will remain in effect (meaning this test can be used) for the duration of the COVID-19 declaration under Section 564(b)(1) of the Act, 21 U.S.C. section 360bbb-3(b)(1), unless the authorization is  terminated or revoked sooner.  Performed at [facility], [place] [address], [place], [place] [address]   Blood culture (routine x 2)     Status: None (Preliminary result)   Collection Time: 07/20/20  7:30 AM   Specimen: BLOOD  Result Value Ref Range Status   Specimen Description   Final    BLOOD RIGHT ANTECUBITAL Performed at [facility] [address], [place], [place] [address]    Special Requests   Final    BOTTLES DRAWN AEROBIC AND ANAEROBIC Blood Culture adequate volume Performed at [facility] [address] [place], [place] [address]    Culture   Final    NO GROWTH < 24 HOURS Performed at [facility] [address] [place], [place] [address]    Report Status PENDING  Incomplete  Blood culture (routine x 2)     Status: None (Preliminary result)   Collection Time: 07/20/20  4:08 PM   Specimen: BLOOD  Result Value Ref Range Status   Specimen Description   Final    BLOOD LEFT ANTECUBITAL Performed at [facility] [address], [place], [place] [address]    Special Requests   Final    BOTTLES DRAWN AEROBIC ONLY Blood Culture adequate volume Performed at [facility] [address] [place], [place] [address]    Culture   Final    NO GROWTH < 12 HOURS Performed at [facility] [address] [place], [place] [address]    Report Status PENDING  Incomplete     Labs: Basic Metabolic Panel: Recent Labs  Lab 07/19/20 2338 07/20/20 1105 07/20/20 1606 07/20/20 1608 07/21/20 0543 07/21/20 1347  NA 135  --  133*  --  137 138  K 2.5*  --  2.9*  --  2.7* 3.7  CL 94*  --  98  --  105 109  CO2 22  --  24  --  23 23  GLUCOSE 119*  --  113*  --  93 97  BUN 17  --  13  --  11 9  CREATININE 1.13*  --  0.77 0.81 0.74 0.70  CALCIUM 10.0  --  8.4*  --  8.3* 8.0*  MG  --  2.0  --   --  2.7*  --    Liver Function Tests: Recent Labs  Lab 07/19/20 2338  07/21/20 0543  AST 40 29  ALT 30 22  ALKPHOS 66 48  BILITOT 0.7 0.6  PROT 9.5* 6.6  ALBUMIN 5.5* 3.6   Recent Labs  Lab 07/19/20 2338  LIPASE 22   No results for input(s): AMMONIA in the last 168 hours. CBC: Recent Labs  Lab 07/19/20 2338 07/20/20 1608 07/21/20 0543  WBC 21.9* 14.2* 10.3  HGB 13.7 12.2 11.6*  HCT 39.9 36.2 34.6*  MCV 94.1 96.0 97.7  PLT 262 209 194   Cardiac Enzymes: No results for input(s): CKTOTAL, CKMB, CKMBINDEX, TROPONINI in the last  168 hours. BNP: BNP (last 3 results) No results for input(s): BNP in the last 8760 hours.  ProBNP (last 3 results) No results for input(s): PROBNP in the last 8760 hours.  CBG: No results for input(s): GLUCAP in the last 168 hours.     Signed:  Nita Sells MD   [facility] 07/21/2020, 3:18 PM

## 2020-07-21 NOTE — Progress Notes (Signed)
CRITICAL VALUE ALERT  Critical Value:  K 2.7  Date & Time Notied:  07/21/20 5625  Provider Notified: Bruna Potter, NP  Orders Received/Actions taken: Awaiting new orders.

## 2020-07-21 NOTE — Progress Notes (Addendum)
Initial Nutrition Assessment  DOCUMENTATION CODES:   Not applicable  INTERVENTION:  Ensure Enlive po BID, each supplement provides 350 kcal and 20 grams of protein  MVI daily  NUTRITION DIAGNOSIS:   Inadequate oral intake related to nausea, vomiting as evidenced by per patient/family report.    GOAL:   Patient will meet greater than or equal to 90% of their needs    MONITOR:   PO intake, Supplement acceptance, Diet advancement, I & O's, Weight trends, Labs  REASON FOR ASSESSMENT:   Malnutrition Screening Tool    ASSESSMENT:   Pt admitted with cannabis hyperemesis syndrome concurrent with and due to cannabis dependence. PMH includes polysubstance abuse, bipolar disorder, HTN, and recurrent admissions for cyclical vomiting syndrome.  RD working remotely.  Pt c/o intractable N/V and reports an inability to tolerate PO diet. Pt now on a soft diet and has consumed 30% of 1 documented meal.   Reviewed wt history. Pt noted to have had a 14% wt loss over the last 11 months, which is insignificant for time frame.   Labs: K+ 2.7(L), Mg 2.7 (H) Medications: Pepcid  NUTRITION - FOCUSED PHYSICAL EXAM:  Unable to perform at this time; will attempt at follow-up  Diet Order:   Diet Order            DIET SOFT Room service appropriate? Yes; Fluid consistency: Thin  Diet effective now                 EDUCATION NEEDS:   No education needs have been identified at this time  Skin:  Skin Assessment: Reviewed RN Assessment  Last BM:  8/29  Height:   Ht Readings from Last 1 Encounters:  07/19/20 5\' 2"  (1.575 m)    Weight:   Wt Readings from Last 1 Encounters:  07/19/20 59 kg    BMI:  Body mass index is 23.78 kg/m.  Estimated Nutritional Needs:   Kcal:  1500-1700  Protein:  75-85 grams  Fluid:  >/=1.5L/d    07/21/20, MS, RD, LDN RD pager number and weekend/on-call pager number located in [place].

## 2020-07-25 LAB — CULTURE, BLOOD (ROUTINE X 2)
Culture: NO GROWTH
Culture: NO GROWTH
Special Requests: ADEQUATE
Special Requests: ADEQUATE

## 2020-08-27 ENCOUNTER — Ambulatory Visit (INDEPENDENT_AMBULATORY_CARE_PROVIDER_SITE_OTHER): Payer: No Payment, Other | Admitting: Behavioral Health

## 2020-08-27 ENCOUNTER — Other Ambulatory Visit: Payer: Self-pay

## 2020-08-27 DIAGNOSIS — F1220 Cannabis dependence, uncomplicated: Secondary | ICD-10-CM | POA: Diagnosis not present

## 2020-08-27 DIAGNOSIS — F419 Anxiety disorder, unspecified: Secondary | ICD-10-CM

## 2020-08-27 DIAGNOSIS — F329 Major depressive disorder, single episode, unspecified: Secondary | ICD-10-CM | POA: Diagnosis not present

## 2020-08-27 DIAGNOSIS — F32A Depression, unspecified: Secondary | ICD-10-CM

## 2020-09-03 ENCOUNTER — Ambulatory Visit (HOSPITAL_COMMUNITY): Payer: Self-pay | Admitting: Behavioral Health

## 2020-09-23 ENCOUNTER — Ambulatory Visit (INDEPENDENT_AMBULATORY_CARE_PROVIDER_SITE_OTHER): Payer: No Payment, Other | Admitting: Behavioral Health

## 2020-09-23 ENCOUNTER — Other Ambulatory Visit: Payer: Self-pay

## 2020-09-23 DIAGNOSIS — F3131 Bipolar disorder, current episode depressed, mild: Secondary | ICD-10-CM
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
## 2020-10-01 ENCOUNTER — Telehealth (HOSPITAL_COMMUNITY): Payer: Self-pay | Admitting: *Deleted

## 2020-10-01 ENCOUNTER — Other Ambulatory Visit: Payer: Self-pay

## 2020-10-01 ENCOUNTER — Ambulatory Visit (INDEPENDENT_AMBULATORY_CARE_PROVIDER_SITE_OTHER): Payer: No Payment, Other | Admitting: Psychiatry

## 2020-10-01 ENCOUNTER — Other Ambulatory Visit (HOSPITAL_COMMUNITY): Payer: Self-pay | Admitting: Psychiatry

## 2020-10-01 ENCOUNTER — Encounter (HOSPITAL_COMMUNITY): Payer: Self-pay | Admitting: Psychiatry

## 2020-10-01 VITALS — BP 154/100 | HR 92 | Temp 98.5°F | Ht 62.0 in | Wt 138.0 lb

## 2020-10-01 DIAGNOSIS — F419 Anxiety disorder, unspecified: Secondary | ICD-10-CM | POA: Diagnosis not present

## 2020-10-01 DIAGNOSIS — F3178 Bipolar disorder, in full remission, most recent episode mixed: Secondary | ICD-10-CM

## 2020-10-01 DIAGNOSIS — F1220 Cannabis dependence, uncomplicated: Secondary | ICD-10-CM

## 2020-10-01 MED ORDER — HYDROXYZINE HCL 50 MG PO TABS: 50.0000 mg | ORAL_TABLET | Freq: Two times a day (BID) | ORAL | 2 refills | Status: DC | PRN

## 2020-10-01 MED ORDER — OLANZAPINE 15 MG PO TABS: 15.0000 mg | ORAL_TABLET | Freq: Every day | ORAL | 2 refills | Status: DC

## 2020-10-01 MED FILL — OLANZapine 15 MG TABS: 15 | 30 days supply | Qty: 30 | Fill #0

## 2020-10-01 MED FILL — hydrOXYzine HCL 50 MG TABS: 50 | 30 days supply | Qty: 60 | Fill #0
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
## 2020-10-01 NOTE — Telephone Encounter (Signed)
Call[facility] but she last filled her meds at the [facility]. Her Olanzapine rx is for 20 mg 1 at HS and Hydrxyzine 50 mg 1 BID prn anxiety. Both were last picked up on 08/27/20 and both were written by Valinda Hoar NP. She has a refill available to her now on the Olanzapine.

## 2020-10-01 NOTE — Telephone Encounter (Signed)
Opened a second time in error. 

## 2020-10-28 ENCOUNTER — Ambulatory Visit (HOSPITAL_COMMUNITY): Payer: No Payment, Other | Admitting: Behavioral Health

## 2020-10-31 MED FILL — hydrOXYzine HCL 50 MG TABS: 50 | 30 days supply | Qty: 60 | Fill #1

## 2020-10-31 MED FILL — OLANZapine 15 MG TABS: 15 | 30 days supply | Qty: 30 | Fill #1

## 2020-12-03 ENCOUNTER — Ambulatory Visit (HOSPITAL_COMMUNITY): Payer: No Payment, Other | Admitting: Psychiatry

## 2020-12-04 MED FILL — OLANZapine 15 MG TABS: 15 | 30 days supply | Qty: 30 | Fill #2

## 2020-12-04 MED FILL — hydrOXYzine HCL 50 MG TABS: 50 | 30 days supply | Qty: 60 | Fill #2

## 2020-12-08 ENCOUNTER — Ambulatory Visit (HOSPITAL_COMMUNITY): Payer: No Payment, Other | Admitting: Behavioral Health

## 2020-12-15 ENCOUNTER — Other Ambulatory Visit: Payer: Self-pay

## 2020-12-15 ENCOUNTER — Ambulatory Visit (HOSPITAL_COMMUNITY): Payer: No Payment, Other | Admitting: Behavioral Health

## 2020-12-23 ENCOUNTER — Other Ambulatory Visit: Payer: Self-pay

## 2020-12-23 ENCOUNTER — Ambulatory Visit (INDEPENDENT_AMBULATORY_CARE_PROVIDER_SITE_OTHER): Payer: No Payment, Other | Admitting: Behavioral Health

## 2020-12-23 DIAGNOSIS — F3131 Bipolar disorder, current episode depressed, mild: Secondary | ICD-10-CM
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
## 2020-12-29 ENCOUNTER — Other Ambulatory Visit (HOSPITAL_COMMUNITY): Payer: Self-pay | Admitting: Psychiatry

## 2020-12-29 ENCOUNTER — Telehealth (HOSPITAL_COMMUNITY): Payer: Self-pay | Admitting: *Deleted

## 2020-12-29 DIAGNOSIS — F419 Anxiety disorder, unspecified: Secondary | ICD-10-CM

## 2020-12-29 DIAGNOSIS — F3178 Bipolar disorder, in full remission, most recent episode mixed: Secondary | ICD-10-CM

## 2020-12-29 MED FILL — OLANZapine 15 MG TABS: 15 | 30 days supply | Qty: 30 | Fill #0

## 2020-12-29 MED FILL — hydrOXYzine HCL 50 MG TABS: 50 | 30 days supply | Qty: 60 | Fill #0

## 2020-12-29 NOTE — Telephone Encounter (Signed)
Rx REFILL REQUEST  hydrOXYzine (ATARAX/VISTARIL) 50 MG tablet 60 tablet 2 10/01/2020   OLANZapine (ZYPREXA) 15 MG tablet 30 tablet 2 10/01/2020

## 2020-12-29 NOTE — Telephone Encounter (Signed)
Done

## 2021-01-06 ENCOUNTER — Ambulatory Visit (HOSPITAL_COMMUNITY): Payer: No Payment, Other | Admitting: Behavioral Health

## 2021-01-07 ENCOUNTER — Ambulatory Visit (HOSPITAL_COMMUNITY): Payer: No Payment, Other | Admitting: Psychiatry

## 2021-01-13 ENCOUNTER — Ambulatory Visit (HOSPITAL_COMMUNITY): Payer: No Payment, Other | Admitting: Behavioral Health

## 2021-01-20 NOTE — Progress Notes (Signed)
   THERAPIST PROGRESS NOTE  Session Time: 1:00PM  Participation Level: Minimal  Behavioral Response: NeatAlertPleasant  Type of Therapy: Individual Therapy  Treatment Goals addressed: Coping  Interventions: CBT and Solution Focused  Summary: Taylor Bartlett is a 35 y.o. female who presents to [facility] for a scheduled individual therapy session. Clt talked about her relationship with her significant other and stated "we get on each other nerves". Clt shared that her father is a positive support in her life and that she confides in him when she is going through difficult situations. Clt reports being easily agitated while at her job and trying not to let things easily trigger her emotions. Clt process ways to better manage her emotions.   Suicidal/Homicidal: No  Therapist Response: Therapist offered encouragement and support. Therapist discussed the circle of control and instructed client to identify the things that are in her control and the things that are outside of her control.  Plan: Return again in 2 weeks.  Diagnosis: Axis I: Depressive Disorder NOS    Axis II: No diagnosis    Mamie Nick, Counselor 01/20/2021

## 2021-01-28 ENCOUNTER — Other Ambulatory Visit (HOSPITAL_COMMUNITY): Payer: Self-pay | Admitting: Psychiatry

## 2021-01-28 ENCOUNTER — Encounter (HOSPITAL_COMMUNITY): Payer: Self-pay | Admitting: Psychiatry

## 2021-01-28 ENCOUNTER — Telehealth (INDEPENDENT_AMBULATORY_CARE_PROVIDER_SITE_OTHER): Payer: No Payment, Other | Admitting: Psychiatry

## 2021-01-28 ENCOUNTER — Other Ambulatory Visit: Payer: Self-pay

## 2021-01-28 DIAGNOSIS — F419 Anxiety disorder, unspecified: Secondary | ICD-10-CM | POA: Diagnosis not present

## 2021-01-28 DIAGNOSIS — F3178 Bipolar disorder, in full remission, most recent episode mixed: Secondary | ICD-10-CM | POA: Insufficient documentation

## 2021-01-28 MED ORDER — HYDROXYZINE HCL 50 MG PO TABS: 50.0000 mg | ORAL_TABLET | Freq: Two times a day (BID) | ORAL | 2 refills | Status: DC | PRN

## 2021-01-28 MED ORDER — OLANZAPINE 20 MG PO TABS: 20.0000 mg | ORAL_TABLET | Freq: Every day | ORAL | 2 refills | Status: DC

## 2021-01-28 MED FILL — OLANZapine 20 MG TABS: 20 | 30 days supply | Qty: 30 | Fill #0

## 2021-01-28 MED FILL — hydrOXYzine HCL 50 MG TABS: 50 | 30 days supply | Qty: 60 | Fill #0
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
## 2021-02-27 ENCOUNTER — Other Ambulatory Visit: Payer: Self-pay

## 2021-02-27 MED FILL — Olanzapine Tab 20 MG: ORAL | 30 days supply | Qty: 30 | Fill #0 | Status: AC

## 2021-02-27 MED FILL — Hydroxyzine HCl Tab 50 MG: ORAL | 30 days supply | Qty: 60 | Fill #0 | Status: CN

## 2021-03-03 ENCOUNTER — Other Ambulatory Visit: Payer: Self-pay

## 2021-03-13 ENCOUNTER — Other Ambulatory Visit: Payer: Self-pay

## 2021-04-28 ENCOUNTER — Telehealth (INDEPENDENT_AMBULATORY_CARE_PROVIDER_SITE_OTHER): Payer: No Payment, Other | Admitting: Psychiatry

## 2021-04-28 ENCOUNTER — Other Ambulatory Visit: Payer: Self-pay

## 2021-04-28 ENCOUNTER — Encounter (HOSPITAL_COMMUNITY): Payer: Self-pay | Admitting: Psychiatry

## 2021-04-28 DIAGNOSIS — F3178 Bipolar disorder, in full remission, most recent episode mixed: Secondary | ICD-10-CM

## 2021-04-28 DIAGNOSIS — F419 Anxiety disorder, unspecified: Secondary | ICD-10-CM

## 2021-04-28 MED ORDER — OLANZAPINE 20 MG PO TABS
ORAL_TABLET | Freq: Every day | ORAL | 2 refills | Status: DC
  Filled 2021-04-28: qty 30, 30d supply, fill #0
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
## 2021-05-05 ENCOUNTER — Other Ambulatory Visit: Payer: Self-pay

## 2021-05-31 ENCOUNTER — Other Ambulatory Visit: Payer: Self-pay

## 2021-05-31 ENCOUNTER — Encounter (HOSPITAL_COMMUNITY): Payer: Self-pay | Admitting: *Deleted

## 2021-05-31 ENCOUNTER — Ambulatory Visit (HOSPITAL_COMMUNITY)
Admission: EM | Admit: 2021-05-31 | Discharge: 2021-05-31 | Disposition: A | Discharge status: Home / Self Care | Payer: Self-pay | Attending: Medical Oncology | Admitting: Medical Oncology

## 2021-05-31 DIAGNOSIS — R112 Nausea with vomiting, unspecified: Secondary | ICD-10-CM

## 2021-05-31 DIAGNOSIS — R197 Diarrhea, unspecified: Secondary | ICD-10-CM

## 2021-05-31 MED ORDER — OMEPRAZOLE 20 MG PO CPDR: 20.0000 mg | DELAYED_RELEASE_CAPSULE | Freq: Every day | ORAL | 0 refills | Status: DC

## 2021-05-31 MED ORDER — ONDANSETRON 4 MG PO TBDP: 4.0000 mg | ORAL_TABLET | Freq: Three times a day (TID) | ORAL | 0 refills | Status: DC | PRN

## 2021-05-31 NOTE — ED Triage Notes (Signed)
Pt reports she ate [employer] last night and 2 hrs later had N/V/D.
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
## 2021-07-19 ENCOUNTER — Encounter (HOSPITAL_COMMUNITY): Payer: Self-pay | Admitting: Emergency Medicine

## 2021-07-19 ENCOUNTER — Emergency Department (HOSPITAL_COMMUNITY)
Admission: EM | Admit: 2021-07-19 | Discharge: 2021-07-19 | Disposition: A | Discharge status: Home / Self Care | Payer: Self-pay | Attending: Emergency Medicine | Admitting: Emergency Medicine

## 2021-07-19 ENCOUNTER — Other Ambulatory Visit: Payer: Self-pay

## 2021-07-19 DIAGNOSIS — F17210 Nicotine dependence, cigarettes, uncomplicated: Secondary | ICD-10-CM | POA: Insufficient documentation

## 2021-07-19 DIAGNOSIS — E876 Hypokalemia: Secondary | ICD-10-CM

## 2021-07-19 DIAGNOSIS — R1084 Generalized abdominal pain: Secondary | ICD-10-CM | POA: Insufficient documentation

## 2021-07-19 DIAGNOSIS — R112 Nausea with vomiting, unspecified: Secondary | ICD-10-CM

## 2021-07-19 DIAGNOSIS — I10 Essential (primary) hypertension: Secondary | ICD-10-CM | POA: Insufficient documentation

## 2021-07-19 DIAGNOSIS — Z79899 Other long term (current) drug therapy: Secondary | ICD-10-CM | POA: Insufficient documentation

## 2021-07-19 DIAGNOSIS — R197 Diarrhea, unspecified: Secondary | ICD-10-CM | POA: Insufficient documentation

## 2021-07-19 DIAGNOSIS — N9489 Other specified conditions associated with female genital organs and menstrual cycle: Secondary | ICD-10-CM | POA: Insufficient documentation

## 2021-07-19 LAB — CBC
HCT: 39 % (ref 36.0–46.0)
Hemoglobin: 13.2 g/dL (ref 12.0–15.0)
MCH: 33.1 pg (ref 26.0–34.0)
MCHC: 33.8 g/dL (ref 30.0–36.0)
MCV: 97.7 fL (ref 80.0–100.0)
Platelets: 315 10*3/uL (ref 150–400)
RBC: 3.99 MIL/uL (ref 3.87–5.11)
RDW: 13.1 % (ref 11.5–15.5)
WBC: 29.1 10*3/uL — ABNORMAL HIGH (ref 4.0–10.5)
nRBC: 0 % (ref 0.0–0.2)

## 2021-07-19 LAB — COMPREHENSIVE METABOLIC PANEL
ALT: 19 U/L (ref 0–44)
AST: 25 U/L (ref 15–41)
Albumin: 4.6 g/dL (ref 3.5–5.0)
Alkaline Phosphatase: 66 U/L (ref 38–126)
Anion gap: 13 (ref 5–15)
BUN: 14 mg/dL (ref 6–20)
CO2: 23 mmol/L (ref 22–32)
Calcium: 10 mg/dL (ref 8.9–10.3)
Chloride: 103 mmol/L (ref 98–111)
Creatinine, Ser: 0.91 mg/dL (ref 0.44–1.00)
GFR, Estimated: >60 mL/min (ref >60)
Glucose, Bld: 120 mg/dL — ABNORMAL HIGH (ref 70–99)
Potassium: 3.2 mmol/L — ABNORMAL LOW (ref 3.5–5.1)
Sodium: 139 mmol/L (ref 135–145)
Total Bilirubin: 0.6 mg/dL (ref 0.3–1.2)
Total Protein: 9 g/dL — ABNORMAL HIGH (ref 6.5–8.1)

## 2021-07-19 LAB — LIPASE, BLOOD: Lipase: 24 U/L (ref 11–51)

## 2021-07-19 LAB — I-STAT BETA HCG BLOOD, ED (MC, WL, AP ONLY): I-stat hCG, quantitative: <5 m[IU]/mL (ref <5)

## 2021-07-19 MED ORDER — POTASSIUM CHLORIDE CRYS ER 20 MEQ PO TBCR: 20.0000 meq | EXTENDED_RELEASE_TABLET | Freq: Two times a day (BID) | ORAL | 0 refills | Status: DC

## 2021-07-19 MED ORDER — PROMETHAZINE HCL 25 MG RE SUPP: 25.0000 mg | Freq: Four times a day (QID) | RECTAL | 0 refills | Status: DC | PRN

## 2021-07-19 MED ORDER — METOCLOPRAMIDE HCL 10 MG PO TABS: 10.0000 mg | ORAL_TABLET | Freq: Four times a day (QID) | ORAL | 0 refills | Status: DC

## 2021-07-19 MED ORDER — METOCLOPRAMIDE HCL 5 MG/ML IJ SOLN
10.0000 mg | Freq: Once | INTRAMUSCULAR | Status: AC
  Administered 2021-07-19: 10 mg via INTRAVENOUS
  Filled 2021-07-19: qty 2

## 2021-07-19 MED ORDER — SODIUM CHLORIDE 0.9 % IV BOLUS
1000.0000 mL | Freq: Once | INTRAVENOUS | Status: AC
  Administered 2021-07-19: 1000 mL via INTRAVENOUS

## 2021-07-19 MED ORDER — ONDANSETRON HCL 4 MG/2ML IJ SOLN
4.0000 mg | Freq: Once | INTRAMUSCULAR | Status: AC
  Administered 2021-07-19: 4 mg via INTRAVENOUS
  Filled 2021-07-19: qty 2

## 2021-07-19 MED ORDER — HALOPERIDOL LACTATE 5 MG/ML IJ SOLN
5.0000 mg | Freq: Once | INTRAMUSCULAR | Status: AC
  Administered 2021-07-19: 5 mg via INTRAVENOUS
  Filled 2021-07-19: qty 1

## 2021-07-19 MED ORDER — ONDANSETRON 4 MG PO TBDP: 4.0000 mg | ORAL_TABLET | Freq: Once | ORAL | Status: DC | PRN

## 2021-07-19 NOTE — Discharge Instructions (Addendum)
Thank you for allowing me to care for you today in the Emergency Department.   You were seen today for nausea, vomiting, diarrhea and your vomiting resolved while you are in the emergency department.  Your potassium was noted to be slightly low so I am going to send you home with a few days of potassium chloride pills.  Take 1 tablet of potassium chloride 2 times daily for the next 5 days.  For nausea or vomiting at home, you can take 1 tablet of Reglan every 6 hours by mouth.  If you are unable to tolerate taking nausea medicine by mouth, you can insert 1 promethazine suppository rectally every 6 hours as needed for nausea or vomiting.  Return to the emergency department if you continue to have uncontrollable vomiting despite using the medications prescribed, if you stop making urine, if you pass out, if you develop worsening abdominal pain with fever, temperature of 100.4 F or higher, or other new, concerning symptoms.

## 2021-07-19 NOTE — ED Notes (Signed)
Pt was able to drink a cup of water and keep it down without vomiting.
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
## 2021-07-19 NOTE — ED Triage Notes (Signed)
Patient presents complaining of possible food poisoning. Patient states she ate chicken nuggets from [employer] the day before yesterday and immediately began N/V/D. No fevers, no blood.

## 2021-07-28 ENCOUNTER — Encounter (HOSPITAL_COMMUNITY): Payer: No Payment, Other | Admitting: Physician Assistant

## 2021-10-29 ENCOUNTER — Other Ambulatory Visit: Payer: Self-pay

## 2021-10-29 ENCOUNTER — Ambulatory Visit (INDEPENDENT_AMBULATORY_CARE_PROVIDER_SITE_OTHER): Payer: No Payment, Other | Admitting: Student in an Organized Health Care Education/Training Program

## 2021-10-29 ENCOUNTER — Encounter (HOSPITAL_COMMUNITY): Payer: Self-pay | Admitting: Student in an Organized Health Care Education/Training Program

## 2021-10-29 VITALS — BP 164/112 | HR 116 | Ht 63.0 in | Wt 131.0 lb

## 2021-10-29 DIAGNOSIS — F3178 Bipolar disorder, in full remission, most recent episode mixed: Secondary | ICD-10-CM

## 2021-10-29 MED ORDER — OLANZAPINE 20 MG PO TABS
20.0000 mg | ORAL_TABLET | Freq: Every day | ORAL | 0 refills | Status: DC
  Filled 2021-10-29 – 2021-11-13 (×2): qty 30, 30d supply, fill #0
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
## 2021-11-05 ENCOUNTER — Ambulatory Visit (HOSPITAL_COMMUNITY): Payer: No Payment, Other | Admitting: Clinical

## 2021-11-05 ENCOUNTER — Other Ambulatory Visit: Payer: Self-pay

## 2021-11-09 ENCOUNTER — Ambulatory Visit (HOSPITAL_COMMUNITY): Payer: No Payment, Other | Admitting: Clinical

## 2021-11-09 ENCOUNTER — Other Ambulatory Visit: Payer: Self-pay

## 2021-11-09 ENCOUNTER — Emergency Department (HOSPITAL_COMMUNITY)
Admission: EM | Admit: 2021-11-09 | Discharge: 2021-11-10 | Disposition: A | Discharge status: Home / Self Care | Payer: Self-pay | Attending: Emergency Medicine | Admitting: Emergency Medicine

## 2021-11-09 ENCOUNTER — Encounter (HOSPITAL_COMMUNITY): Payer: Self-pay

## 2021-11-09 DIAGNOSIS — Z79899 Other long term (current) drug therapy: Secondary | ICD-10-CM | POA: Insufficient documentation

## 2021-11-09 DIAGNOSIS — N739 Female pelvic inflammatory disease, unspecified: Secondary | ICD-10-CM | POA: Insufficient documentation

## 2021-11-09 DIAGNOSIS — K219 Gastro-esophageal reflux disease without esophagitis: Secondary | ICD-10-CM | POA: Insufficient documentation

## 2021-11-09 DIAGNOSIS — N7091 Salpingitis, unspecified: Secondary | ICD-10-CM | POA: Insufficient documentation

## 2021-11-09 DIAGNOSIS — I10 Essential (primary) hypertension: Secondary | ICD-10-CM | POA: Insufficient documentation

## 2021-11-09 DIAGNOSIS — F17210 Nicotine dependence, cigarettes, uncomplicated: Secondary | ICD-10-CM | POA: Insufficient documentation

## 2021-11-09 LAB — URINALYSIS, ROUTINE W REFLEX MICROSCOPIC
Bacteria, UA: NONE SEEN
Bilirubin Urine: NEGATIVE
Glucose, UA: NEGATIVE mg/dL
Ketones, ur: 5 mg/dL — AB
Nitrite: NEGATIVE
Protein, ur: 100 mg/dL — AB
Specific Gravity, Urine: 1.03 (ref 1.005–1.030)
pH: 5 (ref 5.0–8.0)

## 2021-11-09 LAB — CBC WITH DIFFERENTIAL/PLATELET
Abs Immature Granulocytes: 0.05 10*3/uL (ref 0.00–0.07)
Basophils Absolute: 0 10*3/uL (ref 0.0–0.1)
Basophils Relative: 0 %
Eosinophils Absolute: 0.1 10*3/uL (ref 0.0–0.5)
Eosinophils Relative: 1 %
HCT: 37.1 % (ref 36.0–46.0)
Hemoglobin: 12.2 g/dL (ref 12.0–15.0)
Immature Granulocytes: 0 %
Lymphocytes Relative: 15 %
Lymphs Abs: 1.9 10*3/uL (ref 0.7–4.0)
MCH: 32.8 pg (ref 26.0–34.0)
MCHC: 32.9 g/dL (ref 30.0–36.0)
MCV: 99.7 fL (ref 80.0–100.0)
Monocytes Absolute: 0.7 10*3/uL (ref 0.1–1.0)
Monocytes Relative: 5 %
Neutro Abs: 9.8 10*3/uL — ABNORMAL HIGH (ref 1.7–7.7)
Neutrophils Relative %: 79 %
Platelets: 267 10*3/uL (ref 150–400)
RBC: 3.72 MIL/uL — ABNORMAL LOW (ref 3.87–5.11)
RDW: 13.2 % (ref 11.5–15.5)
WBC: 12.5 10*3/uL — ABNORMAL HIGH (ref 4.0–10.5)
nRBC: 0 % (ref 0.0–0.2)

## 2021-11-09 LAB — COMPREHENSIVE METABOLIC PANEL
ALT: 13 U/L (ref 0–44)
AST: 15 U/L (ref 15–41)
Albumin: 3.8 g/dL (ref 3.5–5.0)
Alkaline Phosphatase: 56 U/L (ref 38–126)
Anion gap: 9 (ref 5–15)
BUN: 9 mg/dL (ref 6–20)
CO2: 23 mmol/L (ref 22–32)
Calcium: 9.2 mg/dL (ref 8.9–10.3)
Chloride: 107 mmol/L (ref 98–111)
Creatinine, Ser: 0.61 mg/dL (ref 0.44–1.00)
GFR, Estimated: >60 mL/min (ref >60)
Glucose, Bld: 103 mg/dL — ABNORMAL HIGH (ref 70–99)
Potassium: 3.8 mmol/L (ref 3.5–5.1)
Sodium: 139 mmol/L (ref 135–145)
Total Bilirubin: 0.6 mg/dL (ref 0.3–1.2)
Total Protein: 8.2 g/dL — ABNORMAL HIGH (ref 6.5–8.1)

## 2021-11-09 LAB — I-STAT BETA HCG BLOOD, ED (MC, WL, AP ONLY): I-stat hCG, quantitative: <5 m[IU]/mL (ref <5)

## 2021-11-09 LAB — WET PREP, GENITAL
Sperm: NONE SEEN
WBC, Wet Prep HPF POC: <10 (ref <10)
Yeast Wet Prep HPF POC: NONE SEEN

## 2021-11-09 NOTE — ED Notes (Signed)
Patient is complaining of right lower abdominal pain that started three days ago. She states it feels like a poking pain. Patient states she thinks that she has not drunk enough and that is why she did not urinate until earlier today. Pain 3/10.
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
## 2021-11-09 NOTE — ED Triage Notes (Signed)
Pt presents with c/o lower abdominal pain. Pt reports the pain has been present for 2 days, also c/o constipation.

## 2021-11-09 NOTE — ED Provider Notes (Signed)
Emergency Medicine Provider Triage Evaluation Note  Taylor Bartlett , a 35 y.o. female  was evaluated in triage.  Pt complains of lower abdominal pain for 3 days.  Favors the right lower quadrant.  Patient has had constipation.  No vomiting or fever.  She does report cold sweats.  She reports pain in her abdomen with urination but not burning with urination.  No vaginal bleeding or discharge.  Review of Systems  Positive: Abdominal pain Negative: Fever  Physical Exam  BP (!) 150/108 (BP Location: Left Arm)    Pulse (!) 115    Temp 98.7 F (37.1 C) (Oral)    Resp 18    LMP 11/02/2021 (Approximate)    SpO2 100%  Gen:   Awake, no distress   Resp:  Normal effort  MSK:   Moves extremities without difficulty  Other:  Moderate right-sided lower abdominal, negative Rovsing's  Medical Decision Making  Medically screening exam initiated at 3:00 PM.  Appropriate orders placed.  Taylor Bartlett was informed that the remainder of the evaluation will be completed by another provider, this initial triage assessment does not replace that evaluation, and the importance of remaining in the ED until their evaluation is complete.     Renne Crigler, PA-C 11/09/21 1501    Lorre Nick, MD 11/11/21 1736

## 2021-11-09 NOTE — ED Notes (Signed)
Patient unable to give urine sample at this time

## 2021-11-10 ENCOUNTER — Encounter (HOSPITAL_COMMUNITY): Payer: Self-pay

## 2021-11-10 ENCOUNTER — Emergency Department (HOSPITAL_COMMUNITY): Payer: Self-pay

## 2021-11-10 MED ORDER — CEFTRIAXONE SODIUM 1 G IJ SOLR
500.0000 mg | Freq: Once | INTRAMUSCULAR | Status: AC
  Administered 2021-11-10: 03:00:00 500 mg via INTRAMUSCULAR
  Filled 2021-11-10: qty 10

## 2021-11-10 MED ORDER — METRONIDAZOLE 500 MG PO TABS
500.0000 mg | ORAL_TABLET | Freq: Once | ORAL | Status: AC
  Administered 2021-11-10: 03:00:00 500 mg via ORAL
  Filled 2021-11-10: qty 1

## 2021-11-10 MED ORDER — DOXYCYCLINE HYCLATE 100 MG PO CAPS: 100.0000 mg | ORAL_CAPSULE | Freq: Two times a day (BID) | ORAL | 0 refills | Status: DC

## 2021-11-10 MED ORDER — FENTANYL CITRATE PF 50 MCG/ML IJ SOSY
50.0000 ug | PREFILLED_SYRINGE | Freq: Once | INTRAMUSCULAR | Status: AC
  Administered 2021-11-10: 02:00:00 50 ug via INTRAVENOUS
  Filled 2021-11-10: qty 1

## 2021-11-10 MED ORDER — IBUPROFEN 600 MG PO TABS: 600.0000 mg | ORAL_TABLET | Freq: Four times a day (QID) | ORAL | 0 refills | Status: DC | PRN

## 2021-11-10 MED ORDER — DOXYCYCLINE HYCLATE 100 MG PO TABS
100.0000 mg | ORAL_TABLET | Freq: Once | ORAL | Status: AC
  Administered 2021-11-10: 03:00:00 100 mg via ORAL
  Filled 2021-11-10: qty 1

## 2021-11-10 MED ORDER — METRONIDAZOLE 500 MG PO TABS: 500.0000 mg | ORAL_TABLET | Freq: Two times a day (BID) | ORAL | 0 refills | Status: DC

## 2021-11-10 MED ORDER — IOHEXOL 350 MG/ML SOLN
80.0000 mL | Freq: Once | INTRAVENOUS | Status: AC | PRN
  Administered 2021-11-10: 01:00:00 80 mL via INTRAVENOUS

## 2021-11-10 MED ORDER — STERILE WATER FOR INJECTION IJ SOLN
INTRAMUSCULAR | Status: AC
  Administered 2021-11-10: 03:00:00 10 mL
  Filled 2021-11-10: qty 10

## 2021-11-10 NOTE — Discharge Instructions (Signed)
Follow up with OB/GYN for recheck of pelvic infection as well as your abnormal exam, as discussed. Very Important!  Take the medications as prescribed. Return to the ED with any high fever, severe pain or new symptoms of concern.

## 2021-11-11 LAB — GC/CHLAMYDIA PROBE AMP (~~LOC~~) NOT AT ARMC
Chlamydia: NEGATIVE
Comment: NEGATIVE
Comment: NORMAL
Neisseria Gonorrhea: NEGATIVE

## 2021-11-13 ENCOUNTER — Other Ambulatory Visit: Payer: Self-pay

## 2021-12-08 ENCOUNTER — Telehealth (HOSPITAL_COMMUNITY): Payer: Self-pay | Admitting: Student in an Organized Health Care Education/Training Program

## 2021-12-08 ENCOUNTER — Other Ambulatory Visit (HOSPITAL_COMMUNITY): Payer: Self-pay | Admitting: Student in an Organized Health Care Education/Training Program

## 2021-12-08 ENCOUNTER — Other Ambulatory Visit: Payer: Self-pay

## 2021-12-08 DIAGNOSIS — F3178 Bipolar disorder, in full remission, most recent episode mixed: Secondary | ICD-10-CM

## 2021-12-08 MED ORDER — OLANZAPINE 20 MG PO TABS
20.0000 mg | ORAL_TABLET | Freq: Every day | ORAL | 0 refills | Status: DC
  Filled 2021-12-08 (×2): qty 30, 30d supply, fill #0

## 2021-12-08 NOTE — Telephone Encounter (Signed)
Patient requesting refill: OLANZAPINE 20 MG refill request.  Last written 10/29/21 with no refills. [facility]. Patient phone: [phone] Next appointment Dr. Renaldo Fiddler, 12/24/21.

## 2021-12-08 NOTE — Telephone Encounter (Signed)
Received request for refill of Zyprexa.  Patient has next appointment with me on 12/24/2021.  Will send in a refill for 30 tablets with no refill.   -Zyprexa 20 mg QHS 30 tablets with 0 refills sent.   Arna Snipe MD Resident

## 2021-12-08 NOTE — Telephone Encounter (Signed)
Received request for Zyprexa refill.  Patient's next appointment with me is 12/24/2021.  Will send in one month refill.   -Zyprexa 20 mg QHS 30 tablets with 0 refills sent  Arna Snipe MD Resident

## 2021-12-10 ENCOUNTER — Other Ambulatory Visit: Payer: Self-pay

## 2021-12-11 ENCOUNTER — Other Ambulatory Visit: Payer: Self-pay

## 2021-12-24 ENCOUNTER — Ambulatory Visit (INDEPENDENT_AMBULATORY_CARE_PROVIDER_SITE_OTHER): Payer: No Payment, Other | Admitting: Student in an Organized Health Care Education/Training Program

## 2021-12-24 ENCOUNTER — Other Ambulatory Visit: Payer: Self-pay

## 2021-12-24 ENCOUNTER — Encounter (HOSPITAL_COMMUNITY): Payer: Self-pay | Admitting: Student in an Organized Health Care Education/Training Program

## 2021-12-24 VITALS — BP 165/109 | HR 96 | Ht 62.0 in | Wt 131.0 lb

## 2021-12-24 DIAGNOSIS — F419 Anxiety disorder, unspecified: Secondary | ICD-10-CM

## 2021-12-24 DIAGNOSIS — F1220 Cannabis dependence, uncomplicated: Secondary | ICD-10-CM | POA: Diagnosis not present

## 2021-12-24 DIAGNOSIS — F3178 Bipolar disorder, in full remission, most recent episode mixed: Secondary | ICD-10-CM | POA: Diagnosis not present

## 2021-12-24 MED ORDER — OLANZAPINE 20 MG PO TABS
20.0000 mg | ORAL_TABLET | Freq: Every day | ORAL | 1 refills | Status: DC
  Filled 2021-12-24 – 2022-01-08 (×2): qty 30, 30d supply, fill #0
  Filled 2022-02-04: qty 30, 30d supply, fill #1
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
## 2022-01-07 ENCOUNTER — Other Ambulatory Visit: Payer: Self-pay

## 2022-01-08 ENCOUNTER — Other Ambulatory Visit: Payer: Self-pay

## 2022-02-04 ENCOUNTER — Other Ambulatory Visit: Payer: Self-pay

## 2022-02-05 ENCOUNTER — Other Ambulatory Visit: Payer: Self-pay

## 2022-02-15 ENCOUNTER — Ambulatory Visit: Payer: Self-pay | Admitting: Nurse Practitioner

## 2022-02-17 ENCOUNTER — Other Ambulatory Visit: Payer: Self-pay

## 2022-02-17 ENCOUNTER — Ambulatory Visit (HOSPITAL_COMMUNITY)
Admission: EM | Admit: 2022-02-17 | Discharge: 2022-02-17 | Disposition: A | Discharge status: Home / Self Care | Payer: Self-pay | Attending: Physician Assistant | Admitting: Physician Assistant

## 2022-02-17 ENCOUNTER — Encounter (HOSPITAL_COMMUNITY): Payer: Self-pay

## 2022-02-17 DIAGNOSIS — I10 Essential (primary) hypertension: Secondary | ICD-10-CM

## 2022-02-17 DIAGNOSIS — R112 Nausea with vomiting, unspecified: Secondary | ICD-10-CM

## 2022-02-17 DIAGNOSIS — R197 Diarrhea, unspecified: Secondary | ICD-10-CM

## 2022-02-17 MED ORDER — AMLODIPINE BESYLATE 5 MG PO TABS
5.0000 mg | ORAL_TABLET | Freq: Every day | ORAL | 1 refills | Status: DC
  Filled 2022-02-17 – 2022-03-26 (×3): qty 30, 30d supply, fill #0
  Filled 2022-06-01: qty 30, 30d supply, fill #1
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
## 2022-02-17 NOTE — Discharge Instructions (Signed)
Eat a bland diet and drink plenty of fluid.  If your symptoms do not improve quickly or if at any point anything worsens please return for reevaluation as we discussed. ? ?Your blood pressure is elevated.  Please start amlodipine 5 mg.  We will try to establish you with a primary care provider.  Someone should see you within a few weeks and if you are unable to see them please return here so we can check your blood pressure and adjust her medication.  Avoid NSAIDs (aspirin, ibuprofen/Advil, naproxen/Aleve), sodium, caffeine, decongestants.  Monitor your blood pressure at home and keep a log for evaluation at next appointment.  If anything worsens and you have chest pain, shortness of breath, headache, vision change, dizziness in the setting of high blood pressure you need to go to the emergency room. ?

## 2022-02-17 NOTE — ED Triage Notes (Signed)
Pt presents today with vomiting and frequent diarrhea. Pt denies any pain.  ?

## 2022-02-18 ENCOUNTER — Other Ambulatory Visit: Payer: Self-pay

## 2022-02-18 ENCOUNTER — Ambulatory Visit (INDEPENDENT_AMBULATORY_CARE_PROVIDER_SITE_OTHER): Payer: No Payment, Other | Admitting: Student in an Organized Health Care Education/Training Program

## 2022-02-18 ENCOUNTER — Encounter (HOSPITAL_COMMUNITY): Payer: Self-pay | Admitting: Student in an Organized Health Care Education/Training Program

## 2022-02-18 VITALS — BP 172/112 | HR 90 | Ht 62.0 in | Wt 128.0 lb

## 2022-02-18 DIAGNOSIS — F3178 Bipolar disorder, in full remission, most recent episode mixed: Secondary | ICD-10-CM | POA: Diagnosis not present

## 2022-02-18 DIAGNOSIS — F1220 Cannabis dependence, uncomplicated: Secondary | ICD-10-CM | POA: Diagnosis not present

## 2022-02-18 DIAGNOSIS — F419 Anxiety disorder, unspecified: Secondary | ICD-10-CM | POA: Diagnosis not present

## 2022-02-18 MED ORDER — OLANZAPINE 20 MG PO TABS
20.0000 mg | ORAL_TABLET | Freq: Every day | ORAL | 1 refills | Status: DC
  Filled 2022-02-18 – 2022-03-04 (×2): qty 30, 30d supply, fill #0
  Filled 2022-03-26: qty 30, 30d supply, fill #1
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
## 2022-03-04 ENCOUNTER — Other Ambulatory Visit: Payer: Self-pay

## 2022-03-10 ENCOUNTER — Other Ambulatory Visit: Payer: Self-pay

## 2022-03-26 ENCOUNTER — Other Ambulatory Visit: Payer: Self-pay

## 2022-03-29 ENCOUNTER — Other Ambulatory Visit: Payer: Self-pay

## 2022-03-31 ENCOUNTER — Other Ambulatory Visit: Payer: Self-pay

## 2022-04-22 ENCOUNTER — Ambulatory Visit (INDEPENDENT_AMBULATORY_CARE_PROVIDER_SITE_OTHER): Payer: No Payment, Other | Admitting: Student in an Organized Health Care Education/Training Program

## 2022-04-22 ENCOUNTER — Other Ambulatory Visit: Payer: Self-pay

## 2022-04-22 ENCOUNTER — Encounter (HOSPITAL_COMMUNITY): Payer: Self-pay | Admitting: Student in an Organized Health Care Education/Training Program

## 2022-04-22 VITALS — BP 156/109 | HR 128 | Ht 62.0 in | Wt 128.0 lb

## 2022-04-22 DIAGNOSIS — F419 Anxiety disorder, unspecified: Secondary | ICD-10-CM | POA: Diagnosis not present

## 2022-04-22 DIAGNOSIS — F1220 Cannabis dependence, uncomplicated: Secondary | ICD-10-CM | POA: Diagnosis not present

## 2022-04-22 DIAGNOSIS — F3178 Bipolar disorder, in full remission, most recent episode mixed: Secondary | ICD-10-CM | POA: Diagnosis not present

## 2022-04-22 MED ORDER — OLANZAPINE 20 MG PO TABS
20.0000 mg | ORAL_TABLET | Freq: Every day | ORAL | 2 refills | Status: DC
  Filled 2022-04-22: qty 30, 30d supply, fill #0
  Filled 2022-06-01: qty 30, 30d supply, fill #1
  Filled 2022-07-06: qty 30, 30d supply, fill #2
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
## 2022-04-27 ENCOUNTER — Other Ambulatory Visit: Payer: Self-pay

## 2022-05-24 ENCOUNTER — Encounter (HOSPITAL_COMMUNITY): Payer: Self-pay | Admitting: *Deleted

## 2022-05-24 ENCOUNTER — Emergency Department (HOSPITAL_COMMUNITY)
Admission: EM | Admit: 2022-05-24 | Discharge: 2022-05-24 | Discharge status: Against Medical Advice | Payer: Self-pay | Attending: Emergency Medicine | Admitting: Emergency Medicine

## 2022-05-24 ENCOUNTER — Ambulatory Visit (HOSPITAL_COMMUNITY)
Admission: EM | Admit: 2022-05-24 | Discharge: 2022-05-24 | Disposition: A | Discharge status: Nursing Home (Includes ICF) | Payer: Self-pay | Attending: Internal Medicine | Admitting: Internal Medicine

## 2022-05-24 ENCOUNTER — Encounter (HOSPITAL_COMMUNITY): Payer: Self-pay | Admitting: Pharmacy Technician

## 2022-05-24 DIAGNOSIS — R55 Syncope and collapse: Secondary | ICD-10-CM

## 2022-05-24 DIAGNOSIS — I10 Essential (primary) hypertension: Secondary | ICD-10-CM | POA: Insufficient documentation

## 2022-05-24 DIAGNOSIS — R000 Tachycardia, unspecified: Secondary | ICD-10-CM | POA: Insufficient documentation

## 2022-05-24 DIAGNOSIS — Z5321 Procedure and treatment not carried out due to patient leaving prior to being seen by health care provider: Secondary | ICD-10-CM | POA: Insufficient documentation

## 2022-05-24 HISTORY — DX: Cardiac murmur, unspecified: R01.1

## 2022-05-24 LAB — CBC
HCT: 40 % (ref 36.0–46.0)
Hemoglobin: 12.9 g/dL (ref 12.0–15.0)
MCH: 32.7 pg (ref 26.0–34.0)
MCHC: 32.3 g/dL (ref 30.0–36.0)
MCV: 101.3 fL — ABNORMAL HIGH (ref 80.0–100.0)
Platelets: 239 10*3/uL (ref 150–400)
RBC: 3.95 MIL/uL (ref 3.87–5.11)
RDW: 12.8 % (ref 11.5–15.5)
WBC: 10.7 10*3/uL — ABNORMAL HIGH (ref 4.0–10.5)
nRBC: 0 % (ref 0.0–0.2)

## 2022-05-24 LAB — MAGNESIUM: Magnesium: 2 mg/dL (ref 1.7–2.4)

## 2022-05-24 LAB — BASIC METABOLIC PANEL
Anion gap: 10 (ref 5–15)
BUN: 9 mg/dL (ref 6–20)
CO2: 23 mmol/L (ref 22–32)
Calcium: 9.4 mg/dL (ref 8.9–10.3)
Chloride: 106 mmol/L (ref 98–111)
Creatinine, Ser: 0.79 mg/dL (ref 0.44–1.00)
GFR, Estimated: >60 mL/min (ref >60)
Glucose, Bld: 98 mg/dL (ref 70–99)
Potassium: 3.6 mmol/L (ref 3.5–5.1)
Sodium: 139 mmol/L (ref 135–145)

## 2022-05-24 LAB — CBG MONITORING, ED: Glucose-Capillary: 120 mg/dL — ABNORMAL HIGH (ref 70–99)

## 2022-05-24 NOTE — Discharge Instructions (Signed)
Please report to the nearest emergency department for further evaluation and management of your syncopal episode this afternoon.

## 2022-05-24 NOTE — ED Notes (Signed)
Patient states the wait is too long and she is leaving 

## 2022-05-24 NOTE — ED Triage Notes (Addendum)
Pt reports syncopal episode while getting ready for work @ approx 1430; states her aunt found her laying on the floor - unk length of time of episode. Believes she had a sharp pain to right side of head just PTA. Denies any pain at present, but c/o feeling dizzy. Denies n/v. Reports double vision after she closes and opens her eyes.

## 2022-05-24 NOTE — ED Notes (Signed)
[facility] made aware of transport needed to ED 

## 2022-05-24 NOTE — ED Provider Notes (Signed)
Patient presents to urgent care for evaluation after a syncopal episode while getting ready for work at approximately 2:30 PM.  She states that she was brushing her teeth, became dizzy and saw stars, and then remembers her on waking her up with her laying on the floor.  Patient denies nausea, vomiting, abdominal pain, loss of bowel or bladder continence, back pain, and blood thinner use.  She states that she does not believe that she hit her head on the bathroom floor, but does report that she was having a headache a short time after the incident that has resolved on its own.  She is currently reporting intermittent double vision.  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  She states that last night, she had 4 corona beers to drink and states that this is regular for her after a long day of work.  Sometimes she even drinks up to 6 beer cans after work.  No chest pain, weakness, or shortness of breath reported.  No attempted use of medications for symptoms over-the-counter prior to arrival to urgent care.  Vision is grossly intact.  No focal neurologic deficit to physical exam.  Patient appears mildly dehydrated and mouth appears dry.  Heart rate is 101 and sinus tachycardia without evidence of acute coronary abnormality to EKG in triage.  Patient denies history of cardiac or neurologic problems.  Denies history of diabetes.  States that she has a history of hypertension and has been out of her blood pressure medications for the last 3 weeks.  Blood pressure is noted to be elevated at 156/106 in clinic.  Recommend patient go to the emergency department via ambulance for further evaluation and possible IV rehydration with stat lab work to investigate cause of syncope.  Patient verbalizes understanding and agreement with plan.  Discussed risk of deferring emergency department evaluation.  Patient  discharged from urgent care in stable condition.    Carlisle Beers, [place] 05/24/22 [phone]

## 2022-05-24 NOTE — ED Notes (Signed)
Report called to Asher Muir, ED Charge RN. [facility] en route.

## 2022-05-24 NOTE — ED Triage Notes (Signed)
Pt here from UC with syncopal episode. Pt thinks she was out for approx 10 minutes. Aunt found her passed out in the bathroom. Pt hypertensive, not currently taking her antihypertensives. Initially tachycadic at UC. 20g RAC.

## 2022-05-24 NOTE — ED Provider Triage Note (Signed)
Emergency Medicine Provider Triage Evaluation Note  Taylor Bartlett , a 36 y.o. female  was evaluated in triage.  Pt complains of syncopal episode lasted 8 to 10 minutes prior to arrival.  Patient reports that she has hypertension, and blood pressure issues, is not currently taking her blood pressure medication because she does not have a primary care doctor.  Patient was sent from urgent care for evaluation of cause of syncope.  Patient denies any chest pain, shortness of breath.  She denies hitting her head when she fell, reporting that she fell on some laundry.  Patient reports that she works in a [employer], for 10-hour shifts and often does not eat anything the entire time.  At this time patient denies any chest pain, shortness of breath, headache, or other symptoms.  She reports that she is overall feeling a lot better..  Review of Systems  Positive: Syncope and collapse Negative: Chest pain, shortness of breath, headache, other injuries  Physical Exam  BP (!) 180/106 (BP Location: Left Arm)   Pulse 84   Temp 98.2 F (36.8 C) (Oral)   Resp 17   SpO2 98%  Gen:   Awake, no distress   Resp:  Normal effort  MSK:   Moves extremities without difficulty  Other:  Normal heart rate and rhythm, patient somewhat hypertensive in triage with systolic of 180.  Medical Decision Making  Medically screening exam initiated at 4:23 PM.  Appropriate orders placed.  YAJAYRA FELDT was informed that the remainder of the evaluation will be completed by another provider, this initial triage assessment does not replace that evaluation, and the importance of remaining in the ED until their evaluation is complete.  Workup initiated   Olene Floss, [place] 05/24/22 1624

## 2022-05-24 NOTE — ED Notes (Signed)
Report given to [facility]. 

## 2022-05-31 ENCOUNTER — Emergency Department (HOSPITAL_COMMUNITY)
Admission: EM | Admit: 2022-05-31 | Discharge: 2022-05-31 | Disposition: A | Discharge status: Home / Self Care | Payer: Self-pay | Attending: Emergency Medicine | Admitting: Emergency Medicine

## 2022-05-31 ENCOUNTER — Encounter (HOSPITAL_COMMUNITY): Payer: Self-pay

## 2022-05-31 DIAGNOSIS — R1115 Cyclical vomiting syndrome unrelated to migraine: Secondary | ICD-10-CM

## 2022-05-31 DIAGNOSIS — G43A0 Cyclical vomiting, not intractable: Secondary | ICD-10-CM | POA: Insufficient documentation

## 2022-05-31 DIAGNOSIS — A5901 Trichomonal vulvovaginitis: Secondary | ICD-10-CM | POA: Insufficient documentation

## 2022-05-31 DIAGNOSIS — R112 Nausea with vomiting, unspecified: Secondary | ICD-10-CM | POA: Insufficient documentation

## 2022-05-31 DIAGNOSIS — R1084 Generalized abdominal pain: Secondary | ICD-10-CM | POA: Insufficient documentation

## 2022-05-31 LAB — URINALYSIS, ROUTINE W REFLEX MICROSCOPIC
Glucose, UA: NEGATIVE mg/dL
Ketones, ur: 5 mg/dL — AB
Nitrite: NEGATIVE
Protein, ur: >=300 mg/dL — AB
Specific Gravity, Urine: 1.034 — ABNORMAL HIGH (ref 1.005–1.030)
pH: 5 (ref 5.0–8.0)

## 2022-05-31 LAB — COMPREHENSIVE METABOLIC PANEL
ALT: 16 U/L (ref 0–44)
AST: 20 U/L (ref 15–41)
Albumin: 4.3 g/dL (ref 3.5–5.0)
Alkaline Phosphatase: 67 U/L (ref 38–126)
Anion gap: 14 (ref 5–15)
BUN: 14 mg/dL (ref 6–20)
CO2: 26 mmol/L (ref 22–32)
Calcium: 10.6 mg/dL — ABNORMAL HIGH (ref 8.9–10.3)
Chloride: 99 mmol/L (ref 98–111)
Creatinine, Ser: 0.97 mg/dL (ref 0.44–1.00)
GFR, Estimated: >60 mL/min (ref >60)
Glucose, Bld: 123 mg/dL — ABNORMAL HIGH (ref 70–99)
Potassium: 2.8 mmol/L — ABNORMAL LOW (ref 3.5–5.1)
Sodium: 139 mmol/L (ref 135–145)
Total Bilirubin: 0.8 mg/dL (ref 0.3–1.2)
Total Protein: 9.6 g/dL — ABNORMAL HIGH (ref 6.5–8.1)

## 2022-05-31 LAB — CBC
HCT: 39 % (ref 36.0–46.0)
Hemoglobin: 13.2 g/dL (ref 12.0–15.0)
MCH: 33.2 pg (ref 26.0–34.0)
MCHC: 33.8 g/dL (ref 30.0–36.0)
MCV: 98 fL (ref 80.0–100.0)
Platelets: 248 10*3/uL (ref 150–400)
RBC: 3.98 MIL/uL (ref 3.87–5.11)
RDW: 12.9 % (ref 11.5–15.5)
WBC: 21.5 10*3/uL — ABNORMAL HIGH (ref 4.0–10.5)
nRBC: 0 % (ref 0.0–0.2)

## 2022-05-31 LAB — WET PREP, GENITAL
Clue Cells Wet Prep HPF POC: NONE SEEN
Sperm: NONE SEEN
WBC, Wet Prep HPF POC: >=10 — AB (ref <10)
Yeast Wet Prep HPF POC: NONE SEEN

## 2022-05-31 LAB — PROTIME-INR
INR: 1.1 (ref 0.8–1.2)
Prothrombin Time: 14.4 seconds (ref 11.4–15.2)

## 2022-05-31 LAB — LIPASE, BLOOD: Lipase: 20 U/L (ref 11–51)

## 2022-05-31 LAB — I-STAT BETA HCG BLOOD, ED (MC, WL, AP ONLY): I-stat hCG, quantitative: <5 m[IU]/mL (ref <5)

## 2022-05-31 MED ORDER — LACTATED RINGERS IV BOLUS
1000.0000 mL | Freq: Once | INTRAVENOUS | Status: AC
  Administered 2022-05-31: 1000 mL via INTRAVENOUS

## 2022-05-31 MED ORDER — PANTOPRAZOLE SODIUM 40 MG IV SOLR
40.0000 mg | Freq: Once | INTRAVENOUS | Status: AC
  Administered 2022-05-31: 40 mg via INTRAVENOUS
  Filled 2022-05-31: qty 10

## 2022-05-31 MED ORDER — ONDANSETRON HCL 4 MG/2ML IJ SOLN
4.0000 mg | Freq: Once | INTRAMUSCULAR | Status: AC
  Administered 2022-05-31: 4 mg via INTRAVENOUS
  Filled 2022-05-31: qty 2

## 2022-05-31 MED ORDER — ONDANSETRON 4 MG PO TBDP
4.0000 mg | ORAL_TABLET | Freq: Three times a day (TID) | ORAL | 0 refills | Status: DC | PRN
  Filled 2022-05-31: qty 20, 7d supply, fill #0

## 2022-05-31 MED ORDER — METRONIDAZOLE 500 MG PO TABS
500.0000 mg | ORAL_TABLET | Freq: Two times a day (BID) | ORAL | 0 refills | Status: DC
  Filled 2022-05-31: qty 14, 7d supply, fill #0

## 2022-05-31 MED ORDER — HYDROMORPHONE HCL 1 MG/ML IJ SOLN
0.5000 mg | Freq: Once | INTRAMUSCULAR | Status: AC
  Administered 2022-05-31: 0.5 mg via INTRAVENOUS
  Filled 2022-05-31: qty 1

## 2022-05-31 MED ORDER — METRONIDAZOLE 500 MG PO TABS: 500.0000 mg | ORAL_TABLET | Freq: Two times a day (BID) | ORAL | 0 refills | Status: DC

## 2022-05-31 NOTE — ED Triage Notes (Signed)
Pt arrived via POV, c/o diffuse abd pain, nausea and vomiting since yesterday.

## 2022-05-31 NOTE — Discharge Instructions (Addendum)
You tested positive for trichomonas this is a sexually transmitted disease.   Your workup today was otherwise reassuring.   You have been treated in the emergency department for an infection, possibly sexually transmitted. Results of your gonorrhea and chlamydia tests are pending and you will be notified if they are positive. You will need to return for treatment. Please refrain from intercourse for 7 days and until all sex partners (within previous 60 days) are evaluated and/or treated as well. Please follow up with your primary care provider for continued care and further STD evaluation.  It is very important to practice safe sex and use condoms when sexually active. If your results are positive you need to notify all sexual partners so they can be treated as well. The website [email or web address].[email or web address]/ can be used to send anonymous text messages or emails to alert sexual contacts. Follow up with your doctor, or OBGYN in regards to today's visit.   Gonorrhea and Chlamydia SYMPTOMS  In females, symptoms may go unnoticed. Symptoms that are more noticeable can include:  Belly (abdominal) pain.  Painful intercourse.  Watery mucous-like discharge from the vagina.  Miscarriage.  Discomfort when urinating.  Inflammation of the rectum.  Abnormal gray-green frothy vaginal discharge  Vaginal itching and irritatio  Itching and irritation of the area outside the vagina.   Painful urination.  Bleeding after sexual intercourse.  In males, symptoms include:  Burning with urination.  Pain in the testicles.  Watery mucous-like discharge from the penis.  It can cause longstanding (chronic) pelvic pain after frequent infections.  TREATMENT  PID can cause women to not be able to have children (sterile) if left untreated or if half-treated.  It is important to finish ALL medications given to you.  This is a sexually transmitted infection. So you are also at risk for other sexually transmitted diseases,  including HIV (AIDS), it is recommended that you get tested. HOME CARE INSTRUCTIONS  Warning: This infection is contagious. Do not have sex until treatment is completed. Follow up at your caregiver's office or the clinic to which you were referred. If your diagnosis (learning what is wrong) is confirmed by culture or some other method, your recent sexual contacts need treatment. Even if they are symptom free or have a negative culture or evaluation, they should be treated.  PREVENTION  Women should use sanitary pads instead of tampons for vaginal discharge.  Wipe front to back after using the toilet and avoid douching.   Practice safe sex, use condoms, have only one sex partner and be sure your sex partner is not having sex with others.  Ask your caregiver to test you for chlamydia at your regular checkups or sooner if you are having symptoms.  Ask for further information if you are pregnant.  SEEK IMMEDIATE MEDICAL CARE IF:  You develop an oral temperature above 102 F (38.9 C), not controlled by medications or lasting more than 2 days.  You develop an increase in pain.  You develop any type of abnormal discharge.  You develop vaginal bleeding and it is not time for your period.  You develop painful intercourse.   Bacterial Vaginosis  Bacterial vaginosis (BV) is a vaginal infection where the normal balance of bacteria in the vagina is disrupted. This is not a sexually transmitted disease and your sexual partners do NOT need to be treated. CAUSES  The cause of BV is not fully understood. BV develops when there is an increase or imbalance of harmful bacteria.  Some  activities or behaviors can upset the normal balance of bacteria in the vagina and put women at increased risk including:  Having a new sex partner or multiple sex partners.  Douching.  Using an intrauterine device (IUD) for contraception.  It is not clear what role sexual activity plays in the development of BV. However, women  that have never had sexual intercourse are rarely infected with BV.  Women do not get BV from toilet seats, bedding, swimming pools or from touching objects around them.   SYMPTOMS  Grey vaginal discharge.  A fish-like odor with discharge, especially after sexual intercourse.  Itching or burning of the vagina and vulva.  Burning or pain with urination.  Some women have no signs or symptoms at all.   TREATMENT  Sometimes BV will clear up without treatment.  BV may be treated with antibiotics.  BV can recur after treatment. If this happens, a second round of antibiotics will often be prescribed.  HOME CARE INSTRUCTIONS  Finish all medication as directed by your caregiver.  Do not have sex until treatment is completed.  Do NOT drink any alcoholic beverages while being treated  with Metronidazole (Flagyl). This will cause a severe reaction inducing vomiting.  RESOURCE GUIDE  Dental Problems  Patients with Medicaid: [facility] [phone] [address]                                           [phone] [address] Phone:  [phone]                                                  Phone:  [phone]  If unable to pay or uninsured, contact:  [facility] or [facility]. to become qualified for the adult dental clinic.  Chronic Pain Problems Contact [facility]  [phone] Patients need to be referred by their primary care doctor.  Insufficient Money for Medicine Contact [employer] Way:  call "211" or [facility] 619-733-5418.  No Primary Care Doctor Call [facility]  [phone] Other agencies that provide inexpensive medical care    [facility]  [phone]    [facility]  6173268899    [facility]  701-301-4895    [facility]  201-800-3475    [facility]  469-629-4590    [facility]  [phone]  Psychological Services [facility] Behavioral Health   9543133582 [facility]  [phone] [facility] Mental Health   [phone] (emergency services [phone])  Substance Abuse Resources [facility] Drug Services  6091772479 [facility] 450-061-6462 [facility] [place] (340)432-2591 Floydene Flock (618)355-9614 Residential & Outpatient Substance Abuse Program  249-652-6657  Abuse/Neglect [facility] Child Abuse Hotline (314)387-3278 [facility] Child Abuse Hotline 340-136-2807 (After Hours)  Emergency Shelter [facility] 203-788-7750  Maternity Homes Room at the [place] [facility] [place] 206 494 2450 [place] Alert Services [phone]  MRSA Hotline #:   6067313197    [facility]  [facility] Dept. [address]      [place] Phone:  [phone]                                   Phone:  [phone]                 Phone:  [place] Phone:  [place] [phone] (After Hours)

## 2022-05-31 NOTE — ED Provider Notes (Signed)
[facility]-EMERGENCY DEPT Provider Note   CSN: [id number] Arrival date & time: 05/31/22  1022     History  Chief Complaint  Patient presents with   Abdominal Pain    Taylor Bartlett is a 36 y.o. female.   Abdominal Pain Patient is a 36 year old female presented emergency room today with complaints of diffuse abdominal pain nausea vomiting since yesterday.  She has a history of similar presentations in the past  She states that she still smokes marijuana but not very occasionally.  She denies any chest pain or difficulty breathing no fevers.  No urinary frequency urgency dysuria hematuria  She denies any vaginal discharge but states that her pain is primarily lower.       Home Medications Prior to Admission medications   Medication Sig Start Date End Date Taking? Authorizing Provider  ondansetron (ZOFRAN-ODT) 4 MG disintegrating tablet Take 1 tablet (4 mg total) by mouth every 8 (eight) hours as needed for nausea or vomiting. 05/31/22  Yes Petro Talent S, PA  acetaminophen (TYLENOL) 325 MG tablet Take 325 mg by mouth every 6 (six) hours as needed for mild pain.    [provider]  amLODipine (NORVASC) 5 MG tablet Take 1 tablet (5 mg total) by mouth daily. 02/17/22   Raspet, Noberto Retort, PA-C  famotidine (PEPCID) 40 MG tablet Take 1 tablet (40 mg total) by mouth daily. 01/06/19 05/06/19  Curatolo, Adam, DO  ibuprofen (ADVIL) 600 MG tablet Take 1 tablet (600 mg total) by mouth every 6 (six) hours as needed. 11/10/21   Elpidio Anis, PA-C  metoCLOPramide (REGLAN) 10 MG tablet Take 1 tablet (10 mg total) by mouth every 6 (six) hours. 07/19/21   McDonald, Mia A, PA-C  metroNIDAZOLE (FLAGYL) 500 MG tablet Take 1 tablet (500 mg total) by mouth 2 (two) times daily. 05/31/22   Laderius Valbuena S, PA  OLANZapine (ZYPREXA) 20 MG tablet Take 1 tablet (20 mg total) by mouth once nightly at bedtime. 04/22/22   Lauro Franklin, MD  omeprazole (PRILOSEC) 20 MG  capsule Take 1 capsule (20 mg total) by mouth daily. 05/31/21   Rushie Chestnut, PA-C  potassium chloride SA (KLOR-CON) 20 MEQ tablet Take 1 tablet (20 mEq total) by mouth 2 (two) times daily for 5 days. 07/19/21 07/24/21  McDonald, Mia A, PA-C  promethazine (PHENERGAN) 25 MG suppository Place 1 suppository (25 mg total) rectally every 6 (six) hours as needed for nausea or vomiting. 07/19/21   McDonald, Mia A, PA-C      Allergies    Tomato and Other    Review of Systems   Review of Systems  Gastrointestinal:  Positive for abdominal pain.    Physical Exam Updated Vital Signs BP (!) 145/99   Pulse 83   Temp 98.5 F (36.9 C) (Oral)   Resp 17   SpO2 100%  Physical Exam Vitals and nursing note reviewed.  Constitutional:      General: She is in acute distress.     Comments: On initial exam patient is retching violently.  HENT:     Head: Normocephalic and atraumatic.     Nose: Nose normal.  Eyes:     General: No scleral icterus. Cardiovascular:     Rate and Rhythm: Normal rate and regular rhythm.     Pulses: Normal pulses.     Heart sounds: Normal heart sounds.  Pulmonary:     Effort: Pulmonary effort is normal. No respiratory distress.     Breath  sounds: No wheezing.  Abdominal:     Palpations: Abdomen is soft.     Tenderness: There is abdominal tenderness.     Comments: Diffuse abdominal tenderness.  Genitourinary:    Comments: Vulva without lesions or abnormality Vaginal canal without abnormal discharge or lesion Cervix appears normal, is closed No adnexal tenderness or CMT Musculoskeletal:     Cervical back: Normal range of motion.     Right lower leg: No edema.     Left lower leg: No edema.  Skin:    General: Skin is warm and dry.     Capillary Refill: Capillary refill takes less than 2 seconds.  Neurological:     Mental Status: She is alert. Mental status is at baseline.  Psychiatric:        Mood and Affect: Mood normal.        Behavior: Behavior normal.      ED Results / Procedures / Treatments   Labs (all labs ordered are listed, but only abnormal results are displayed) Labs Reviewed  WET PREP, GENITAL - Abnormal; Notable for the following components:      Result Value   Trich, Wet Prep PRESENT (*)    WBC, Wet Prep HPF POC >=10 (*)    All other components within normal limits  COMPREHENSIVE METABOLIC PANEL - Abnormal; Notable for the following components:   Potassium 2.8 (*)    Glucose, Bld 123 (*)    Calcium 10.6 (*)    Total Protein 9.6 (*)    All other components within normal limits  CBC - Abnormal; Notable for the following components:   WBC 21.5 (*)    All other components within normal limits  URINALYSIS, ROUTINE W REFLEX MICROSCOPIC - Abnormal; Notable for the following components:   Color, Urine AMBER (*)    APPearance CLOUDY (*)    Specific Gravity, Urine 1.034 (*)    Hgb urine dipstick SMALL (*)    Bilirubin Urine SMALL (*)    Ketones, ur 5 (*)    Protein, ur >=300 (*)    Leukocytes,Ua TRACE (*)    Bacteria, UA FEW (*)    All other components within normal limits  LIPASE, BLOOD  PROTIME-INR  I-STAT BETA HCG BLOOD, ED ([facility], [facility], [facility] ONLY)  GC/CHLAMYDIA PROBE AMP ([facility]) NOT AT [facility]    EKG None  Radiology No results found.  Procedures Procedures    Medications Ordered in ED Medications  lactated ringers bolus 1,000 mL (0 mLs Intravenous Stopped 05/31/22 1801)  ondansetron (ZOFRAN) injection 4 mg (4 mg Intravenous Given 05/31/22 1632)  HYDROmorphone (DILAUDID) injection 0.5 mg (0.5 mg Intravenous Given 05/31/22 1633)  pantoprazole (PROTONIX) injection 40 mg (40 mg Intravenous Given 05/31/22 1633)  lactated ringers bolus 1,000 mL (0 mLs Intravenous Stopped 05/31/22 1919)  HYDROmorphone (DILAUDID) injection 0.5 mg (0.5 mg Intravenous Given 05/31/22 1801)    ED Course/ Medical Decision Making/ A&P                           Medical Decision Making Amount and/or Complexity of Data Reviewed Labs:  ordered.  Risk Prescription drug management.   Patient is a 36 year old female presented emergency room today with complaints of diffuse abdominal pain nausea vomiting since yesterday.  She has a history of similar presentations in the past  She states that she still smokes marijuana but not very occasionally.  She denies any chest pain or difficulty breathing no fevers.  No  urinary frequency urgency dysuria hematuria  She denies any vaginal discharge but states that her pain is primarily lower.  Initial exam patient is retching and diffusely tender.  Provided with LR, Dilaudid, Protonix and Zofran.  On my reassessment patient feels much better.  No longer tender.  Pelvic exam unremarkable.  CBC with with leukocytosis likely due to stress to marginalization with forceful vomiting.  CMP notable for hypokalemia repleted here.  Wet prep positive for trichomonas.  Will treat and recommend close follow-up with PCP and OB/GYN.  GC chlamydia swab pending.  Transitioning conversation we will hold off on empiric treatment at this time.  Urinalysis with blood consistent with patient's symptoms.  She is not anemic.  Will discharge home at this time with return precautions.  We will treat trichomonas with Flagyl and will discharge home with Zofran.  She is tolerating p.o. at this time.  On reassessment no abdominal tenderness.  Final Clinical Impression(s) / ED Diagnoses Final diagnoses:  Generalized abdominal pain  Cyclical vomiting  Trichomonas vaginitis    Rx / DC Orders ED Discharge Orders          Ordered    metroNIDAZOLE (FLAGYL) 500 MG tablet  2 times daily,   Status:  Discontinued        05/31/22 2038    metroNIDAZOLE (FLAGYL) 500 MG tablet  2 times daily        05/31/22 2043    ondansetron (ZOFRAN-ODT) 4 MG disintegrating tablet  Every 8 hours PRN        05/31/22 2043              Gailen Shelter, [place] 05/31/22 2248    Ernie Avena, MD 05/31/22 2348

## 2022-06-01 ENCOUNTER — Other Ambulatory Visit: Payer: Self-pay

## 2022-06-01 LAB — GC/CHLAMYDIA PROBE AMP (~~LOC~~) NOT AT ARMC
Chlamydia: NEGATIVE
Comment: NEGATIVE
Comment: NORMAL
Neisseria Gonorrhea: NEGATIVE

## 2022-06-02 ENCOUNTER — Other Ambulatory Visit: Payer: Self-pay

## 2022-07-06 ENCOUNTER — Other Ambulatory Visit: Payer: Self-pay

## 2022-07-29 ENCOUNTER — Encounter (HOSPITAL_COMMUNITY): Payer: No Payment, Other | Admitting: Student in an Organized Health Care Education/Training Program

## 2022-08-09 ENCOUNTER — Other Ambulatory Visit: Payer: Self-pay

## 2022-08-09 ENCOUNTER — Other Ambulatory Visit (HOSPITAL_COMMUNITY): Payer: Self-pay | Admitting: Student in an Organized Health Care Education/Training Program

## 2022-08-09 DIAGNOSIS — F3178 Bipolar disorder, in full remission, most recent episode mixed: Secondary | ICD-10-CM

## 2022-08-09 MED ORDER — OLANZAPINE 20 MG PO TABS
20.0000 mg | ORAL_TABLET | Freq: Every day | ORAL | 2 refills | Status: DC
  Filled 2022-08-09: qty 30, 30d supply, fill #0
  Filled 2022-09-08: qty 30, 30d supply, fill #1
  Filled 2022-10-11: qty 30, 30d supply, fill #2

## 2022-08-09 NOTE — Telephone Encounter (Signed)
Received request for refill of patient's Zyprexa.  This was sent.   Sent Zyprexa 20 mg QHS.  30 tablets with 1 refill.    Fatima Sanger MD Resident

## 2022-09-08 ENCOUNTER — Other Ambulatory Visit: Payer: Self-pay

## 2022-09-18 ENCOUNTER — Emergency Department (HOSPITAL_COMMUNITY): Payer: Self-pay

## 2022-09-18 ENCOUNTER — Other Ambulatory Visit: Payer: Self-pay

## 2022-09-18 ENCOUNTER — Encounter (HOSPITAL_COMMUNITY): Payer: Self-pay

## 2022-09-18 ENCOUNTER — Inpatient Hospital Stay (HOSPITAL_COMMUNITY)
Admission: EM | Admit: 2022-09-18 | Discharge: 2022-09-21 | DRG: 759 | Disposition: A | Discharge status: Home / Self Care | Payer: Self-pay | Attending: Obstetrics and Gynecology | Admitting: Obstetrics and Gynecology

## 2022-09-18 DIAGNOSIS — K3184 Gastroparesis: Secondary | ICD-10-CM | POA: Diagnosis present

## 2022-09-18 DIAGNOSIS — K219 Gastro-esophageal reflux disease without esophagitis: Secondary | ICD-10-CM | POA: Diagnosis present

## 2022-09-18 DIAGNOSIS — N730 Acute parametritis and pelvic cellulitis: Principal | ICD-10-CM | POA: Diagnosis present

## 2022-09-18 DIAGNOSIS — R1031 Right lower quadrant pain: Secondary | ICD-10-CM

## 2022-09-18 DIAGNOSIS — F12188 Cannabis abuse with other cannabis-induced disorder: Secondary | ICD-10-CM | POA: Diagnosis present

## 2022-09-18 DIAGNOSIS — N83299 Other ovarian cyst, unspecified side: Secondary | ICD-10-CM | POA: Diagnosis present

## 2022-09-18 DIAGNOSIS — I10 Essential (primary) hypertension: Secondary | ICD-10-CM | POA: Diagnosis present

## 2022-09-18 LAB — COMPREHENSIVE METABOLIC PANEL
ALT: 17 U/L (ref 0–44)
AST: 23 U/L (ref 15–41)
Albumin: 4.3 g/dL (ref 3.5–5.0)
Alkaline Phosphatase: 71 U/L (ref 38–126)
Anion gap: 12 (ref 5–15)
BUN: 13 mg/dL (ref 6–20)
CO2: 27 mmol/L (ref 22–32)
Calcium: 9.8 mg/dL (ref 8.9–10.3)
Chloride: 98 mmol/L (ref 98–111)
Creatinine, Ser: 0.83 mg/dL (ref 0.44–1.00)
GFR, Estimated: >60 mL/min (ref >60)
Glucose, Bld: 119 mg/dL — ABNORMAL HIGH (ref 70–99)
Potassium: 3.4 mmol/L — ABNORMAL LOW (ref 3.5–5.1)
Sodium: 137 mmol/L (ref 135–145)
Total Bilirubin: 0.8 mg/dL (ref 0.3–1.2)
Total Protein: 9.1 g/dL — ABNORMAL HIGH (ref 6.5–8.1)

## 2022-09-18 LAB — URINALYSIS, ROUTINE W REFLEX MICROSCOPIC
Bacteria, UA: NONE SEEN
Bilirubin Urine: NEGATIVE
Glucose, UA: NEGATIVE mg/dL
Ketones, ur: 20 mg/dL — AB
Leukocytes,Ua: NEGATIVE
Nitrite: NEGATIVE
Protein, ur: >=300 mg/dL — AB
Specific Gravity, Urine: >1.046 — ABNORMAL HIGH (ref 1.005–1.030)
pH: 5 (ref 5.0–8.0)

## 2022-09-18 LAB — RAPID URINE DRUG SCREEN, HOSP PERFORMED
Amphetamines: POSITIVE — AB
Barbiturates: NOT DETECTED
Benzodiazepines: NOT DETECTED
Cocaine: NOT DETECTED
Opiates: NOT DETECTED
Tetrahydrocannabinol: POSITIVE — AB

## 2022-09-18 LAB — CBC
HCT: 40.3 % (ref 36.0–46.0)
Hemoglobin: 13.2 g/dL (ref 12.0–15.0)
MCH: 32.8 pg (ref 26.0–34.0)
MCHC: 32.8 g/dL (ref 30.0–36.0)
MCV: 100.2 fL — ABNORMAL HIGH (ref 80.0–100.0)
Platelets: 236 K/uL (ref 150–400)
RBC: 4.02 MIL/uL (ref 3.87–5.11)
RDW: 13.5 % (ref 11.5–15.5)
WBC: 19.3 K/uL — ABNORMAL HIGH (ref 4.0–10.5)
nRBC: 0 % (ref 0.0–0.2)

## 2022-09-18 LAB — LIPASE, BLOOD: Lipase: 32 U/L (ref 11–51)

## 2022-09-18 LAB — WET PREP, GENITAL
Sperm: NONE SEEN
Trich, Wet Prep: NONE SEEN
WBC, Wet Prep HPF POC: <10 (ref <10)
Yeast Wet Prep HPF POC: NONE SEEN

## 2022-09-18 LAB — HCG, QUANTITATIVE, PREGNANCY: hCG, Beta Chain, Quant, S: 1 m[IU]/mL (ref <5)

## 2022-09-18 LAB — MAGNESIUM: Magnesium: 1.9 mg/dL (ref 1.7–2.4)

## 2022-09-18 LAB — TROPONIN I (HIGH SENSITIVITY): Troponin I (High Sensitivity): 5 ng/L (ref <18)

## 2022-09-18 MED ORDER — SODIUM CHLORIDE 0.9 % IV SOLN
2.0000 g | INTRAVENOUS | Status: DC
  Administered 2022-09-20 – 2022-09-21 (×2): 2 g via INTRAVENOUS
  Filled 2022-09-18 (×4): qty 20

## 2022-09-18 MED ORDER — HYDROMORPHONE HCL 1 MG/ML IJ SOLN
0.5000 mg | INTRAMUSCULAR | Status: DC | PRN
  Administered 2022-09-19 (×4): 1 mg via INTRAVENOUS
  Administered 2022-09-19 – 2022-09-20 (×3): 2 mg via INTRAVENOUS
  Filled 2022-09-18: qty 1
  Filled 2022-09-18: qty 2
  Filled 2022-09-18 (×2): qty 1
  Filled 2022-09-18 (×2): qty 2
  Filled 2022-09-18: qty 1

## 2022-09-18 MED ORDER — SODIUM CHLORIDE 0.9 % IV SOLN
100.0000 mg | Freq: Once | INTRAVENOUS | Status: DC
  Administered 2022-09-18: 100 mg via INTRAVENOUS
  Filled 2022-09-18: qty 100

## 2022-09-18 MED ORDER — LACTATED RINGERS IV BOLUS: 1000.0000 mL | Freq: Once | INTRAVENOUS | Status: DC

## 2022-09-18 MED ORDER — FENTANYL CITRATE PF 50 MCG/ML IJ SOSY
50.0000 ug | PREFILLED_SYRINGE | Freq: Once | INTRAMUSCULAR | Status: AC
  Administered 2022-09-18: 50 ug via INTRAVENOUS
  Filled 2022-09-18: qty 1

## 2022-09-18 MED ORDER — SODIUM CHLORIDE 0.9 % IV SOLN
2.0000 g | Freq: Once | INTRAVENOUS | Status: DC
  Administered 2022-09-19: 2 g via INTRAVENOUS

## 2022-09-18 MED ORDER — DROPERIDOL 2.5 MG/ML IJ SOLN
2.5000 mg | Freq: Once | INTRAMUSCULAR | Status: AC
  Administered 2022-09-18: 2.5 mg via INTRAVENOUS
  Filled 2022-09-18: qty 2

## 2022-09-18 MED ORDER — SODIUM CHLORIDE 0.9 % IV SOLN: 25.0000 mg | Freq: Four times a day (QID) | INTRAVENOUS | Status: DC | PRN

## 2022-09-18 MED ORDER — SODIUM CHLORIDE 0.9 % IV SOLN: 3.0000 g | Freq: Once | INTRAVENOUS | Status: DC

## 2022-09-18 MED ORDER — AMLODIPINE BESYLATE 5 MG PO TABS
5.0000 mg | ORAL_TABLET | Freq: Every day | ORAL | Status: DC
  Filled 2022-09-18: qty 1

## 2022-09-18 MED ORDER — FAMOTIDINE IN NACL 20-0.9 MG/50ML-% IV SOLN
20.0000 mg | Freq: Once | INTRAVENOUS | Status: AC
  Administered 2022-09-18: 20 mg via INTRAVENOUS
  Filled 2022-09-18: qty 50

## 2022-09-18 MED ORDER — PRENATAL MULTIVITAMIN CH
1.0000 | ORAL_TABLET | Freq: Every day | ORAL | Status: DC
  Administered 2022-09-19: 1 via ORAL
  Filled 2022-09-18 (×2): qty 1

## 2022-09-18 MED ORDER — LIDOCAINE HCL 2 % IJ SOLN
INTRAMUSCULAR | Status: AC
  Administered 2022-09-18: 1 mL via INTRAMUSCULAR
  Filled 2022-09-18: qty 20

## 2022-09-18 MED ORDER — CEFTRIAXONE SODIUM 1 G IJ SOLR
500.0000 mg | Freq: Once | INTRAMUSCULAR | Status: AC
  Administered 2022-09-18: 500 mg via INTRAMUSCULAR
  Filled 2022-09-18: qty 10

## 2022-09-18 MED ORDER — OXYCODONE-ACETAMINOPHEN 5-325 MG PO TABS
1.0000 | ORAL_TABLET | ORAL | Status: DC | PRN
  Administered 2022-09-20 (×2): 2 via ORAL
  Administered 2022-09-20: 1 via ORAL
  Administered 2022-09-20 – 2022-09-21 (×2): 2 via ORAL
  Filled 2022-09-18 (×2): qty 2
  Filled 2022-09-18: qty 1
  Filled 2022-09-18 (×2): qty 2

## 2022-09-18 MED ORDER — AMLODIPINE BESYLATE 5 MG PO TABS
5.0000 mg | ORAL_TABLET | Freq: Every day | ORAL | Status: DC
  Administered 2022-09-18: 5 mg via ORAL
  Filled 2022-09-18: qty 1

## 2022-09-18 MED ORDER — OLANZAPINE 10 MG PO TABS
20.0000 mg | ORAL_TABLET | Freq: Every day | ORAL | Status: DC
  Administered 2022-09-18 – 2022-09-20 (×3): 20 mg via ORAL
  Filled 2022-09-18 (×4): qty 2

## 2022-09-18 MED ORDER — SENNOSIDES-DOCUSATE SODIUM 8.6-50 MG PO TABS
1.0000 | ORAL_TABLET | Freq: Every evening | ORAL | Status: DC | PRN
  Administered 2022-09-19: 1 via ORAL
  Filled 2022-09-18: qty 1

## 2022-09-18 MED ORDER — FENTANYL CITRATE PF 50 MCG/ML IJ SOSY
50.0000 ug | PREFILLED_SYRINGE | Freq: Once | INTRAMUSCULAR | Status: AC
  Administered 2022-09-18: 50 ug via INTRAVENOUS

## 2022-09-18 MED ORDER — DEXTROSE IN LACTATED RINGERS 5 % IV SOLN: INTRAVENOUS | Status: DC

## 2022-09-18 MED ORDER — KETOROLAC TROMETHAMINE 30 MG/ML IJ SOLN
30.0000 mg | Freq: Three times a day (TID) | INTRAMUSCULAR | Status: AC
  Administered 2022-09-18 – 2022-09-19 (×3): 30 mg via INTRAVENOUS
  Filled 2022-09-18 (×3): qty 1

## 2022-09-18 MED ORDER — FENTANYL CITRATE PF 50 MCG/ML IJ SOSY
PREFILLED_SYRINGE | INTRAMUSCULAR | Status: AC
  Administered 2022-09-18: 50 ug via INTRAVENOUS
  Filled 2022-09-18: qty 1

## 2022-09-18 MED ORDER — SODIUM CHLORIDE 0.9 % IV SOLN
100.0000 mg | Freq: Two times a day (BID) | INTRAVENOUS | Status: DC
  Administered 2022-09-19 – 2022-09-20 (×4): 100 mg via INTRAVENOUS
  Filled 2022-09-18 (×6): qty 100

## 2022-09-18 MED ORDER — ONDANSETRON HCL 4 MG PO TABS
8.0000 mg | ORAL_TABLET | Freq: Four times a day (QID) | ORAL | Status: DC | PRN
  Administered 2022-09-19: 8 mg via ORAL
  Filled 2022-09-18: qty 2

## 2022-09-18 MED ORDER — SODIUM CHLORIDE 0.9 % IV SOLN: 8.0000 mg | Freq: Four times a day (QID) | INTRAVENOUS | Status: DC | PRN

## 2022-09-18 MED ORDER — HYDRALAZINE HCL 25 MG PO TABS
25.0000 mg | ORAL_TABLET | Freq: Once | ORAL | Status: DC
  Filled 2022-09-18: qty 1

## 2022-09-18 MED ORDER — LACTATED RINGERS IV BOLUS
1000.0000 mL | Freq: Once | INTRAVENOUS | Status: AC
  Administered 2022-09-18: 1000 mL via INTRAVENOUS

## 2022-09-18 MED ORDER — ENOXAPARIN SODIUM 40 MG/0.4ML IJ SOSY
40.0000 mg | PREFILLED_SYRINGE | INTRAMUSCULAR | Status: DC
  Administered 2022-09-18 – 2022-09-20 (×3): 40 mg via SUBCUTANEOUS
  Filled 2022-09-18 (×3): qty 0.4

## 2022-09-18 MED ORDER — FAMOTIDINE IN NACL 20-0.9 MG/50ML-% IV SOLN
20.0000 mg | Freq: Two times a day (BID) | INTRAVENOUS | Status: DC
  Administered 2022-09-18 – 2022-09-21 (×6): 20 mg via INTRAVENOUS
  Filled 2022-09-18 (×6): qty 50

## 2022-09-18 MED ORDER — IOHEXOL 300 MG/ML  SOLN
100.0000 mL | Freq: Once | INTRAMUSCULAR | Status: AC | PRN
  Administered 2022-09-18: 100 mL via INTRAVENOUS

## 2022-09-18 NOTE — ED Triage Notes (Signed)
Patient said she has been vomiting since Thursday. Had smoked the same weed for a week now. Has been diagnosed with getting sick after smoking weed in the past.

## 2022-09-18 NOTE — ED Provider Notes (Signed)
[facility]-EMERGENCY DEPT Provider Note   CSN: [id number] Arrival date & time: 09/18/22  [id number]     History  Chief Complaint  Patient presents with   Emesis    Taylor Carmon GinsbergS Bogacki is a 36 y.o. female.   Emesis Associated symptoms: abdominal pain      36 year old female with medical history significant for alcohol abuse, hypertension, gastroparesis, GERD, depression, History of cannabis hyperemesis who presents emergency department with multiple complaints.  The patient states that she has been having episodes of emesis since this past Thursday.  She has been smoking cannabis and has a history of emesis after cannabis use in the past.  She states that her emesis has since turned dark and slightly coffee-ground.  She also endorses right lower quadrant abdominal pain.  She denies any diarrhea, fevers or chills.  She has not had a bowel movement in the past couple of days but has had no oral intake in that same amount of time.  She is passing gas.  She denies any vaginal bleeding or vaginal discharge.  Home Medications Prior to Admission medications   Medication Sig Start Date End Date Taking? Authorizing Provider  acetaminophen (TYLENOL) 325 MG tablet Take 325 mg by mouth every 6 (six) hours as needed for mild pain.    [provider]  amLODipine (NORVASC) 5 MG tablet Take 1 tablet (5 mg total) by mouth daily. 02/17/22   Raspet, Noberto RetortErin K, PA-C  famotidine (PEPCID) 40 MG tablet Take 1 tablet (40 mg total) by mouth daily. 01/06/19 05/06/19  Curatolo, Adam, DO  ibuprofen (ADVIL) 600 MG tablet Take 1 tablet (600 mg total) by mouth every 6 (six) hours as needed. 11/10/21   Elpidio AnisUpstill, Shari, PA-C  metoCLOPramide (REGLAN) 10 MG tablet Take 1 tablet (10 mg total) by mouth every 6 (six) hours. 07/19/21   McDonald, Mia A, PA-C  metroNIDAZOLE (FLAGYL) 500 MG tablet Take 1 tablet (500 mg total) by mouth 2 (two) times daily. 05/31/22   Fondaw, Wylder S, PA  OLANZapine (ZYPREXA)  20 MG tablet Take 1 tablet (20 mg total) by mouth once nightly at bedtime. 08/09/22   Lauro FranklinPashayan, Alexander S, MD  omeprazole (PRILOSEC) 20 MG capsule Take 1 capsule (20 mg total) by mouth daily. 05/31/21   Rushie Chestnutovington, Sarah M, PA-C  ondansetron (ZOFRAN-ODT) 4 MG disintegrating tablet Take 1 tablet (4 mg total) by mouth every 8 (eight) hours as needed for nausea or vomiting. 05/31/22   Gailen ShelterFondaw, Wylder S, PA  potassium chloride SA (KLOR-CON) 20 MEQ tablet Take 1 tablet (20 mEq total) by mouth 2 (two) times daily for 5 days. 07/19/21 07/24/21  McDonald, Mia A, PA-C  promethazine (PHENERGAN) 25 MG suppository Place 1 suppository (25 mg total) rectally every 6 (six) hours as needed for nausea or vomiting. 07/19/21   McDonald, Mia A, PA-C      Allergies    Tomato and Other    Review of Systems   Review of Systems  Gastrointestinal:  Positive for abdominal pain, nausea and vomiting.  All other systems reviewed and are negative.   Physical Exam Updated Vital Signs BP 122/89   Pulse 84   Temp 98.7 F (37.1 C) (Oral)   Resp 20   Ht 5\' 2"  (1.575 m)   Wt 60.3 kg   SpO2 100%   BMI 24.33 kg/m  Physical Exam Vitals and nursing note reviewed. Exam conducted with a chaperone present.  Constitutional:      General: She is not in  acute distress.    Appearance: She is well-developed.  HENT:     Head: Normocephalic and atraumatic.  Eyes:     Conjunctiva/sclera: Conjunctivae normal.  Cardiovascular:     Rate and Rhythm: Normal rate and regular rhythm.     Heart sounds: No murmur heard. Pulmonary:     Effort: Pulmonary effort is normal. No respiratory distress.     Breath sounds: Normal breath sounds.  Abdominal:     Palpations: Abdomen is soft.     Tenderness: There is abdominal tenderness in the right lower quadrant.  Genitourinary:    Cervix: Cervical motion tenderness present.     Adnexa:        Right: Tenderness present.        Left: No tenderness.       Comments: Significant amount of  discharge coming from the cervix, white and yellow in color, positive mild cervical motion tenderness, Right-sided adnexal tenderness Musculoskeletal:        General: No swelling.     Cervical back: Neck supple.  Skin:    General: Skin is warm and dry.     Capillary Refill: Capillary refill takes less than 2 seconds.  Neurological:     Mental Status: She is alert.  Psychiatric:        Mood and Affect: Mood normal.     ED Results / Procedures / Treatments   Labs (all labs ordered are listed, but only abnormal results are displayed) Labs Reviewed  COMPREHENSIVE METABOLIC PANEL - Abnormal; Notable for the following components:      Result Value   Potassium 3.4 (*)    Glucose, Bld 119 (*)    Total Protein 9.1 (*)    All other components within normal limits  CBC - Abnormal; Notable for the following components:   WBC 19.3 (*)    MCV 100.2 (*)    All other components within normal limits  URINALYSIS, ROUTINE W REFLEX MICROSCOPIC - Abnormal; Notable for the following components:   Color, Urine AMBER (*)    APPearance HAZY (*)    Specific Gravity, Urine >1.046 (*)    Hgb urine dipstick SMALL (*)    Ketones, ur 20 (*)    Protein, ur >=300 (*)    All other components within normal limits  RAPID URINE DRUG SCREEN, HOSP PERFORMED - Abnormal; Notable for the following components:   Amphetamines POSITIVE (*)    Tetrahydrocannabinol POSITIVE (*)    All other components within normal limits  WET PREP, GENITAL  LIPASE, BLOOD  MAGNESIUM  HCG, QUANTITATIVE, PREGNANCY  I-STAT BETA HCG BLOOD, ED ([facility], [facility], [facility] ONLY)  GC/CHLAMYDIA PROBE AMP ([place]) NOT AT [facility]  TROPONIN I (HIGH SENSITIVITY)    EKG EKG Interpretation  Date/Time:  Saturday September 18 2022 13:50:13 EDT Ventricular Rate:  82 PR Interval:  151 QRS Duration: 83 QT Interval:  380 QTC Calculation: 444 R Axis:   16 Text Interpretation: Sinus rhythm Probable left atrial enlargement Anterior infarct, old Confirmed by  Ernie Avena ([id number]) on 09/18/2022 2:50:17 PM  Radiology US PELVIC COMPLETE W TRANSVAGINAL AND TORSION R/O  Result Date: 09/18/2022 CLINICAL DATA:  Right adnexal mass on CT. Right lower quadrant pain. EXAM: TRANSABDOMINAL AND TRANSVAGINAL ULTRASOUND OF PELVIS DOPPLER ULTRASOUND OF OVARIES TECHNIQUE: Both transabdominal and transvaginal ultrasound examinations of the pelvis were performed. Transabdominal technique was performed for global imaging of the pelvis including uterus, ovaries, adnexal regions, and pelvic cul-de-sac. It was necessary to proceed with endovaginal exam following  the transabdominal exam to visualize the ovaries. Color and duplex Doppler ultrasound was utilized to evaluate blood flow to the ovaries. COMPARISON:  CT scan earlier same day. FINDINGS: Uterus Measurements: 6.1 x 3.6 x 3.4 cm = volume: 39 mL. No fibroids or other mass visualized. Endometrium Thickness: 6.  No focal abnormality visualized. Right ovary Measurements: 4.7 x 3.6 x 4.0 cm = volume: 36 mL. Multiple cystic foci are seen in the right ovary some of which have internal septation. 1.9 cm heterogeneous lesion identified in the right ovary. Dominant simple appearing cyst measures up to 1.9 cm. Left ovary Measurements: 4.0 x 2.6 x 2.9 cm = volume: 16 mL. 2.0 cm complex cyst noted in the left ovary. Pulsed Doppler evaluation of both ovaries demonstrates normal low-resistance arterial and venous waveforms. Other findings Trace free fluid identified in the pelvis. IMPRESSION: 1. 1.9 cm heterogeneous lesion in the right ovary. This may represent a hemorrhagic cyst or endometrioma. Given the patient has pain in the right lower quadrant this does not represent an incidental finding. The tubular structure suggesting dilated fallopian tube on CT is not visualized on ultrasound. Appearance may be related to multiple cysts/follicles including hemorrhagic cyst/follicle. Correlation for signs/symptoms of tubo-ovarian abscess recommended.  Arterial and venous flow is detectable in the right ovary making torsion unlikely. Gyn consultation recommended. Short-term follow-up pelvic ultrasound may prove helpful to ensure resolution 2. 2.0 cm complex cyst in the left ovary. This may be a hemorrhagic cyst . This can also be reassessed on follow-up imaging. 3. Trace free fluid in the pelvis. 4. No evidence for ovarian torsion. Electronically Signed   By: Kennith Center M.D.   On: 09/18/2022 18:51   CT ABDOMEN PELVIS W CONTRAST  Result Date: 09/18/2022 CLINICAL DATA:  Nausea vomiting with right lower quadrant pain. EXAM: CT ABDOMEN AND PELVIS WITH CONTRAST TECHNIQUE: Multidetector CT imaging of the abdomen and pelvis was performed using the standard protocol following bolus administration of intravenous contrast. RADIATION DOSE REDUCTION: This exam was performed according to the departmental dose-optimization program which includes automated exposure control, adjustment of the mA and/or kV according to patient size and/or use of iterative reconstruction technique. CONTRAST:  OMNIPAQUE IOHEXOL 300 MG/ML  SOLN COMPARISON:  11/10/2021 FINDINGS: Lower chest: Unremarkable Hepatobiliary: 7 mm hypervascular lesion lateral segment left liver (14/2) is most likely benign and may be a flash filling hemangioma or tiny vascular malformation. No followup imaging is recommended. There is no evidence for gallstones, gallbladder wall thickening, or pericholecystic fluid. No intrahepatic or extrahepatic biliary dilation. Pancreas: No focal mass lesion. No dilatation of the main duct. No intraparenchymal cyst. No peripancreatic edema. Spleen: No splenomegaly. No focal mass lesion. Adrenals/Urinary Tract: No adrenal nodule or mass. Kidneys unremarkable. No evidence for hydroureter. The urinary bladder appears normal for the degree of distention. Stomach/Bowel: Stomach is unremarkable. No gastric wall thickening. No evidence of outlet obstruction. Duodenum is normally  positioned as is the ligament of Treitz. No small bowel wall thickening. No small bowel dilatation. The terminal ileum is normal. The appendix is normal. No gross colonic mass. No colonic wall thickening. Vascular/Lymphatic: No abdominal aortic aneurysm. No abdominal aortic atherosclerotic calcification. There is no gastrohepatic or hepatoduodenal ligament lymphadenopathy. No retroperitoneal or mesenteric lymphadenopathy. 8 mm short axis aortocaval node on 24/2 is upper normal for size and similar to decreased in the interval. No pelvic sidewall lymphadenopathy. Reproductive: The uterus is unremarkable. 4.3 x 3.3 x 3.9 cm multiloculated structure identified in the right adnexal space, progressive  since 11/10/2021. Findings are more suggestive of a multiloculated complex cystic mass than a dilated coiled fallopian tube on the current study. There does appear to be a fallopian tube draped along the cranial margin of the structure. Similar 3.0 x 2.6 cm lesion identified in the left adnexal space. Other: Trace free fluid in the pelvis. Musculoskeletal: No worrisome lytic or sclerotic osseous abnormality. IMPRESSION: 1. 4.3 x 3.3 x 3.9 cm multiloculated structure in the right adnexal space, progressive since 11/10/2021. On the current study, this has an appearance more suggestive of a multiloculated adnexal mass than a coiled fallopian tube although their does appear to be a dilated fallopian draped along the cranial margin of the structure. Findings may reflect multiloculated cystic mass of the right ovary, tubo-ovarian abscess, or a coiled dilated fallopian tube. Pelvic ultrasound recommended to further evaluate. 2. Similar 3.0 x 2.6 cm lesion in the left adnexal space. 3. Trace free fluid in the pelvis. 4. 7 mm hypervascular lesion lateral segment left liver is statistically most likely benign and may be a flash filling hemangioma or tiny vascular malformation. No followup imaging is recommended. Electronically Signed    By: Kennith Center M.D.   On: 09/18/2022 17:03   DG Chest Portable 1 View  Result Date: 09/18/2022 CLINICAL DATA:  Vomiting for 2 days with a cough. EXAM: PORTABLE CHEST 1 VIEW COMPARISON:  Chest radiograph dated 09/20/2015. FINDINGS: The heart size and mediastinal contours are within normal limits. Both lungs are clear. The visualized skeletal structures are unremarkable. IMPRESSION: No active disease. Electronically Signed   By: Romona Curls M.D.   On: 09/18/2022 15:04    Procedures Procedures    Medications Ordered in ED Medications  amLODipine (NORVASC) tablet 5 mg (0 mg Oral Hold 09/18/22 1952)  doxycycline (VIBRAMYCIN) 100 mg in sodium chloride 0.9 % 250 mL IVPB (has no administration in time range)  cefTRIAXone (ROCEPHIN) 2 g in sodium chloride 0.9 % 100 mL IVPB (has no administration in time range)  lactated ringers bolus 1,000 mL (has no administration in time range)  droperidol (INAPSINE) 2.5 MG/ML injection 2.5 mg (2.5 mg Intravenous Given 09/18/22 1417)  lactated ringers bolus 1,000 mL (0 mLs Intravenous Stopped 09/18/22 1732)  famotidine (PEPCID) IVPB 20 mg premix (0 mg Intravenous Stopped 09/18/22 1531)  fentaNYL (SUBLIMAZE) injection 50 mcg (50 mcg Intravenous Given 09/18/22 1417)  iohexol (OMNIPAQUE) 300 MG/ML solution 100 mL (100 mLs Intravenous Contrast Given 09/18/22 1642)  fentaNYL (SUBLIMAZE) injection 50 mcg (50 mcg Intravenous Given 09/18/22 1914)  cefTRIAXone (ROCEPHIN) injection 500 mg (500 mg Intramuscular Given 09/18/22 1949)  lidocaine (XYLOCAINE) 2 % (with pres) injection (1 mL Intramuscular Given 09/18/22 1952)    ED Course/ Medical Decision Making/ A&P Clinical Course as of 09/18/22 2020  Sat Sep 18, 2022  1352 WBC(!): 19.3 [JL]  1906 Amphetamines(!): POSITIVE [JL]  1906 Tetrahydrocannabinol(!): POSITIVE [JL]    Clinical Course User Index [JL] Ernie Avena, MD                           Medical Decision Making Amount and/or Complexity of Data  Reviewed Labs: ordered. Decision-making details documented in ED Course. Radiology: ordered.  Risk Prescription drug management.    36 year old female with medical history significant for alcohol abuse, hypertension, gastroparesis, GERD, depression, History of cannabis hyperemesis who presents emergency department with multiple complaints.  The patient states that she has been having episodes of emesis since this past Thursday.  She  has been smoking cannabis and has a history of emesis after cannabis use in the past.  She states that her emesis has since turned dark and slightly coffee-ground.  She also endorses right lower quadrant abdominal pain.  She denies any diarrhea, fevers or chills.  She has not had a bowel movement in the past couple of days but has had no oral intake in that same amount of time.  She is passing gas.  She denies any vaginal bleeding or vaginal discharge.   Vitals and telemetry on arrival: Afebrile, not tachycardic or tachypneic, BP 145/103, saturating 99% on room air.  Sinus rhythm noted on cardiac telemetry  Pertinent exam findings include:Right lower quadrant tenderness to palpation, no rebound or guarding, positive cervical motion tenderness, purulence noted coming from the cervix, right-sided adnexal tenderness present  Differential Diagnosis: PID, tubo-ovarian abscess, ovarian torsion, appendicitis, ovarian cyst, endometrioma.  Also considered , Bowel Obstruction, Pyelonephritis, Nephrolithiasis, Pancreatitis, Cholecystitis, Shingles, Perforated Bowel or Ulcer, Diverticulosis/itis, Ischemic Mesentery, Inflammatory Bowel Disease, Strangulated/Incarcerated Hernia.  Also strongly favored cannabis hyperemesis syndrome given the patient's history.  Lab results include: Leukocytosis to 19.3, no anemia, CMP with mild hypokalemia to 3.4, otherwise unremarkable, initial troponin normal, hCG normal, magnesium 1.9, UDS with positive for amphetamines and THC, urinalysis without  evidence of UTI, ketones present  Imaging results include: Chest x-ray unremarkable CT abdomen pelvis: IMPRESSION:  1. 4.3 x 3.3 x 3.9 cm multiloculated structure in the right adnexal  space, progressive since 11/10/2021. On the current study, this has  an appearance more suggestive of a multiloculated adnexal mass than  a coiled fallopian tube although their does appear to be a dilated  fallopian draped along the cranial margin of the structure.  Findings may reflect multiloculated cystic mass of the right ovary,  tubo-ovarian abscess, or a coiled dilated fallopian tube. Pelvic  ultrasound recommended to further evaluate.  2. Similar 3.0 x 2.6 cm lesion in the left adnexal space.  3. Trace free fluid in the pelvis.  4. 7 mm hypervascular lesion lateral segment left liver is  statistically most likely benign and may be a flash filling  hemangioma or tiny vascular malformation. No followup imaging is  recommended.   Pelvic ultrasound: IMPRESSION:  1. 1.9 cm heterogeneous lesion in the right ovary. This may  represent a hemorrhagic cyst or endometrioma. Given the patient has  pain in the right lower quadrant this does not represent an  incidental finding. The tubular structure suggesting dilated  fallopian tube on CT is not visualized on ultrasound. Appearance may  be related to multiple cysts/follicles including hemorrhagic  cyst/follicle. Correlation for signs/symptoms of tubo-ovarian  abscess recommended. Arterial and venous flow is detectable in the  right ovary making torsion unlikely. Gyn consultation recommended.  Short-term follow-up pelvic ultrasound may prove helpful to ensure  resolution  2. 2.0 cm complex cyst in the left ovary. This may be a hemorrhagic  cyst . This can also be reassessed on follow-up imaging.  3. Trace free fluid in the pelvis.  4. No evidence for ovarian torsion.    Course of tx has consisted of: Patient was initially treated for cannabis  hyperemesis syndrome with IV Pepcid, IV droperidol, IV fluid bolus 1 L, IV fentanyl.  Following these interventions, the patient had resolution of her nausea and vomiting.  She continued to endorse persistent right lower quadrant, right pelvic pain.  Her pelvic exam was concerning for possible infection and her imaging, exam and labs are concerning for possible developing  tubo-ovarian abscess/PID. She was covered with IV Rocephin and Doxycycline.  I spoke with Dr. Elonda Husky of OBGYN who accepted the patient in admission at [facility]. The patient was updated regarding the plan of care.  Final Clinical Impression(s) / ED Diagnoses Final diagnoses:  Cannabis hyperemesis syndrome concurrent with and due to cannabis abuse (Taylor Bartlett)  RLQ abdominal pain  Complex ovarian cyst  PID (acute pelvic inflammatory disease)    Rx / DC Orders ED Discharge Orders     None         Regan Lemming, MD 09/18/22 2020

## 2022-09-18 NOTE — ED Notes (Signed)
Gave report to Burnard Leigh RN 1S OB speciality, patient is going to room 117, [facility] will be here in 15 minutes to transport the patient

## 2022-09-18 NOTE — ED Notes (Signed)
Pt is in MRI  

## 2022-09-18 NOTE — ED Notes (Signed)
Patient transported to Ultrasound 

## 2022-09-19 DIAGNOSIS — N730 Acute parametritis and pelvic cellulitis: Secondary | ICD-10-CM

## 2022-09-19 LAB — CBC WITH DIFFERENTIAL/PLATELET
Abs Immature Granulocytes: 0.08 10*3/uL — ABNORMAL HIGH (ref 0.00–0.07)
Basophils Absolute: 0 10*3/uL (ref 0.0–0.1)
Basophils Relative: 0 %
Eosinophils Absolute: 0.1 10*3/uL (ref 0.0–0.5)
Eosinophils Relative: 0 %
HCT: 33.9 % — ABNORMAL LOW (ref 36.0–46.0)
Hemoglobin: 11.5 g/dL — ABNORMAL LOW (ref 12.0–15.0)
Immature Granulocytes: 1 %
Lymphocytes Relative: 17 %
Lymphs Abs: 2.8 10*3/uL (ref 0.7–4.0)
MCH: 33 pg (ref 26.0–34.0)
MCHC: 33.9 g/dL (ref 30.0–36.0)
MCV: 97.1 fL (ref 80.0–100.0)
Monocytes Absolute: 0.9 10*3/uL (ref 0.1–1.0)
Monocytes Relative: 6 %
Neutro Abs: 12.5 10*3/uL — ABNORMAL HIGH (ref 1.7–7.7)
Neutrophils Relative %: 76 %
Platelets: 193 10*3/uL (ref 150–400)
RBC: 3.49 MIL/uL — ABNORMAL LOW (ref 3.87–5.11)
RDW: 13.2 % (ref 11.5–15.5)
WBC: 16.4 10*3/uL — ABNORMAL HIGH (ref 4.0–10.5)
nRBC: 0 % (ref 0.0–0.2)

## 2022-09-19 LAB — RAPID HIV SCREEN (HIV 1/2 AB+AG)
HIV 1/2 Antibodies: NONREACTIVE
HIV-1 P24 Antigen - HIV24: NONREACTIVE

## 2022-09-19 MED ORDER — NICOTINE 21 MG/24HR TD PT24
21.0000 mg | MEDICATED_PATCH | Freq: Every day | TRANSDERMAL | Status: DC
  Administered 2022-09-19 – 2022-09-20 (×2): 21 mg via TRANSDERMAL
  Filled 2022-09-19 (×4): qty 1

## 2022-09-19 MED ORDER — AMLODIPINE BESYLATE 5 MG PO TABS
10.0000 mg | ORAL_TABLET | Freq: Every day | ORAL | Status: DC
  Administered 2022-09-19 – 2022-09-21 (×3): 10 mg via ORAL
  Filled 2022-09-19 (×3): qty 2

## 2022-09-19 MED ORDER — METRONIDAZOLE 500 MG PO TABS
500.0000 mg | ORAL_TABLET | Freq: Two times a day (BID) | ORAL | Status: DC
  Administered 2022-09-19 – 2022-09-21 (×5): 500 mg via ORAL
  Filled 2022-09-19 (×5): qty 1

## 2022-09-19 NOTE — H&P (Signed)
History      Chief Complaint  Patient presents with   Emesis      Taylor Bartlett is a 36 y.o. female.     Emesis Associated symptoms: abdominal pain        36 year old female with medical history significant for alcohol abuse, hypertension, gastroparesis, GERD, depression, History of cannabis hyperemesis who presents emergency department with multiple complaints.  The patient states that she has been having episodes of emesis since this past Thursday.  She has been smoking cannabis and has a history of emesis after cannabis use in the past.  She states that her emesis has since turned dark and slightly coffee-ground.  She also endorses right lower quadrant abdominal pain.  She denies any diarrhea, fevers or chills.  She has not had a bowel movement in the past couple of days but has had no oral intake in that same amount of time.  She is passing gas.  She denies any vaginal bleeding or vaginal discharge.   Home Medications        Prior to Admission medications   Medication Sig Start Date End Date Taking? Authorizing Provider  acetaminophen (TYLENOL) 325 MG tablet Take 325 mg by mouth every 6 (six) hours as needed for mild pain.       [provider]  amLODipine (NORVASC) 5 MG tablet Take 1 tablet (5 mg total) by mouth daily. 02/17/22     Raspet, Noberto Retort, PA-C  famotidine (PEPCID) 40 MG tablet Take 1 tablet (40 mg total) by mouth daily. 01/06/19 05/06/19   Curatolo, Adam, DO  ibuprofen (ADVIL) 600 MG tablet Take 1 tablet (600 mg total) by mouth every 6 (six) hours as needed. 11/10/21     Elpidio Anis, PA-C  metoCLOPramide (REGLAN) 10 MG tablet Take 1 tablet (10 mg total) by mouth every 6 (six) hours. 07/19/21     McDonald, Mia A, PA-C  metroNIDAZOLE (FLAGYL) 500 MG tablet Take 1 tablet (500 mg total) by mouth 2 (two) times daily. 05/31/22     Fondaw, Wylder S, PA  OLANZapine (ZYPREXA) 20 MG tablet Take 1 tablet (20 mg total) by mouth once nightly at bedtime. 08/09/22     Lauro Franklin, MD  omeprazole (PRILOSEC) 20 MG capsule Take 1 capsule (20 mg total) by mouth daily. 05/31/21     Rushie Chestnut, PA-C  ondansetron (ZOFRAN-ODT) 4 MG disintegrating tablet Take 1 tablet (4 mg total) by mouth every 8 (eight) hours as needed for nausea or vomiting. 05/31/22     Gailen Shelter, PA  potassium chloride SA (KLOR-CON) 20 MEQ tablet Take 1 tablet (20 mEq total) by mouth 2 (two) times daily for 5 days. 07/19/21 07/24/21   McDonald, Mia A, PA-C  promethazine (PHENERGAN) 25 MG suppository Place 1 suppository (25 mg total) rectally every 6 (six) hours as needed for nausea or vomiting. 07/19/21     McDonald, Mia A, PA-C       Allergies            Tomato and Other     Review of Systems   Review of Systems  Gastrointestinal:  Positive for abdominal pain, nausea and vomiting.  All other systems reviewed and are negative.     Physical Exam Updated Vital Signs BP 122/89   Pulse 84   Temp 98.7 F (37.1 C) (Oral)   Resp 20   Ht 5\' 2"  (1.575 m)   Wt 60.3 kg   SpO2 100%  BMI 24.33 kg/m  Physical Exam Vitals and nursing note reviewed. Exam conducted with a chaperone present.  Constitutional:      General: She is not in acute distress.    Appearance: She is well-developed.  HENT:     Head: Normocephalic and atraumatic.  Eyes:     Conjunctiva/sclera: Conjunctivae normal.  Cardiovascular:     Rate and Rhythm: Normal rate and regular rhythm.     Heart sounds: No murmur heard. Pulmonary:     Effort: Pulmonary effort is normal. No respiratory distress.     Breath sounds: Normal breath sounds.  Abdominal:     Palpations: Abdomen is soft.     Tenderness: There is abdominal tenderness in the right lower quadrant.  Genitourinary:    Cervix: Cervical motion tenderness present.     Adnexa:        Right: Tenderness present.        Left: No tenderness.       Comments: Significant amount of discharge coming from the cervix, white and yellow in color, positive mild cervical  motion tenderness, Right-sided adnexal tenderness Musculoskeletal:        General: No swelling.     Cervical back: Neck supple.  Skin:    General: Skin is warm and dry.     Capillary Refill: Capillary refill takes less than 2 seconds.  Neurological:     Mental Status: She is alert.  Psychiatric:        Mood and Affect: Mood normal.        ED Results / Procedures / Treatments   Labs (all labs ordered are listed, but only abnormal results are displayed)      Labs Reviewed  COMPREHENSIVE METABOLIC PANEL - Abnormal; Notable for the following components:      Result Value     Potassium 3.4 (*)      Glucose, Bld 119 (*)      Total Protein 9.1 (*)      All other components within normal limits  CBC - Abnormal; Notable for the following components:    WBC 19.3 (*)      MCV 100.2 (*)      All other components within normal limits  URINALYSIS, ROUTINE W REFLEX MICROSCOPIC - Abnormal; Notable for the following components:    Color, Urine AMBER (*)      APPearance HAZY (*)      Specific Gravity, Urine >1.046 (*)      Hgb urine dipstick SMALL (*)      Ketones, ur 20 (*)      Protein, ur >=300 (*)      All other components within normal limits  RAPID URINE DRUG SCREEN, HOSP PERFORMED - Abnormal; Notable for the following components:    Amphetamines POSITIVE (*)      Tetrahydrocannabinol POSITIVE (*)      All other components within normal limits  WET PREP, GENITAL  LIPASE, BLOOD  MAGNESIUM  HCG, QUANTITATIVE, PREGNANCY  I-STAT BETA HCG BLOOD, ED ([facility], [facility], [facility] ONLY)  GC/CHLAMYDIA PROBE AMP ([place]) NOT AT [facility]  TROPONIN I (HIGH SENSITIVITY)      EKG EKG Interpretation   Date/Time:                  Saturday September 18 2022 13:50:13 EDT Ventricular Rate:         82 PR Interval:                 151 QRS Duration: 83  QT Interval:                 380 QTC Calculation:        444 R Axis:                         16 Text Interpretation:      Sinus rhythm Probable left atrial  enlargement Anterior infarct, old Confirmed by Lawsing, James ([id number]) on 09/18/2022 2:50:17 PM   Radiology US PELVIC COMPLETE W TRANSVAGINAL AND TORSION R/O   Result Date: 09/18/2022 CLINICAL DATA:  Right adnexal mass on CT. Right lower quadrant pain. EXAM: TRANSABDOMINAL AND TRANSVAGINAL ULTRASOUND OF PELVIS DOPPLER ULTRASOUND OF OVARIES TECHNIQUE: Both transabdominal and transvaginal ultrasound examinations of the pelvis were performed. Transabdominal technique was performed for global imaging of the pelvis including uterus, ovaries, adnexal regions, and pelvic cul-de-sac. It was necessary to proceed with endovaginal exam following the transabdominal exam to visualize the ovaries. Color and duplex Doppler ultrasound was utilized to evaluate blood flow to the ovaries. COMPARISON:  CT scan earlier same day. FINDINGS: Uterus Measurements: 6.1 x 3.6 x 3.4 cm = volume: 39 mL. No fibroids or other mass visualized. Endometrium Thickness: 6.  No focal abnormality visualized. Right ovary Measurements: 4.7 x 3.6 x 4.0 cm = volume: 36 mL. Multiple cystic foci are seen in the right ovary some of which have internal septation. 1.9 cm heterogeneous lesion identified in the right ovary. Dominant simple appearing cyst measures up to 1.9 cm. Left ovary Measurements: 4.0 x 2.6 x 2.9 cm = volume: 16 mL. 2.0 cm complex cyst noted in the left ovary. Pulsed Doppler evaluation of both ovaries demonstrates normal low-resistance arterial and venous waveforms. Other findings Trace free fluid identified in the pelvis. IMPRESSION: 1. 1.9 cm heterogeneous lesion in the right ovary. This may represent a hemorrhagic cyst or endometrioma. Given the patient has pain in the right lower quadrant this does not represent an incidental finding. The tubular structure suggesting dilated fallopian tube on CT is not visualized on ultrasound. Appearance may be related to multiple cysts/follicles including hemorrhagic cyst/follicle. Correlation for  signs/symptoms of tubo-ovarian abscess recommended. Arterial and venous flow is detectable in the right ovary making torsion unlikely. Gyn consultation recommended. Short-term follow-up pelvic ultrasound may prove helpful to ensure resolution 2. 2.0 cm complex cyst in the left ovary. This may be a hemorrhagic cyst . This can also be reassessed on follow-up imaging. 3. Trace free fluid in the pelvis. 4. No evidence for ovarian torsion. Electronically Signed   By: Eric  Mansell M.D.   On: 09/18/2022 18:51    CT ABDOMEN PELVIS W CONTRAST   Result Date: 09/18/2022 CLINICAL DATA:  Nausea vomiting with right lower quadrant pain. EXAM: CT ABDOMEN AND PELVIS WITH CONTRAST TECHNIQUE: Multidetector CT imaging of the abdomen and pelvis was performed using the standard protocol following bolus administration of intravenous contrast. RADIATION DOSE REDUCTION: This exam was performed according to the departmental dose-optimization program which includes automated exposure control, adjustment of the mA and/or kV according to patient size and/or use of iterative reconstruction technique. CONTRAST:  <MEASUREMERicharda<MEASUREMENTRicharda<MEASUREMENTRicharda<MEASUREMEN<MEASUREMENTRicharda<MEASUREMENTRicharda<MEASUREMENTRicharda<MEASUREMENTRicharda OverlieIPAQUE IOHEXOL 300 MG/ML  SOLN COMPARISON:  11/10/2021 FINDINGS: Lower chest: Unremarkable Hepatobiliary: 7 mm hypervascular lesion lateral segment left liver (14/2) is most likely benign and may be a flash filling hemangioma or tiny vascular malformation. No followup imaging is recommended. There is no evidence for gallstones, gallbladder wall thickening, or pericholecystic fluid. No intrahepatic or extrahepatic biliary dilation. Pancreas: No  focal mass lesion. No dilatation of the main duct. No intraparenchymal cyst. No peripancreatic edema. Spleen: No splenomegaly. No focal mass lesion. Adrenals/Urinary Tract: No adrenal nodule or mass. Kidneys unremarkable. No evidence for hydroureter. The urinary bladder appears normal for the degree of distention. Stomach/Bowel: Stomach is unremarkable. No gastric wall thickening. No  evidence of outlet obstruction. Duodenum is normally positioned as is the ligament of Treitz. No small bowel wall thickening. No small bowel dilatation. The terminal ileum is normal. The appendix is normal. No gross colonic mass. No colonic wall thickening. Vascular/Lymphatic: No abdominal aortic aneurysm. No abdominal aortic atherosclerotic calcification. There is no gastrohepatic or hepatoduodenal ligament lymphadenopathy. No retroperitoneal or mesenteric lymphadenopathy. 8 mm short axis aortocaval node on 24/2 is upper normal for size and similar to decreased in the interval. No pelvic sidewall lymphadenopathy. Reproductive: The uterus is unremarkable. 4.3 x 3.3 x 3.9 cm multiloculated structure identified in the right adnexal space, progressive since 11/10/2021. Findings are more suggestive of a multiloculated complex cystic mass than a dilated coiled fallopian tube on the current study. There does appear to be a fallopian tube draped along the cranial margin of the structure. Similar 3.0 x 2.6 cm lesion identified in the left adnexal space. Other: Trace free fluid in the pelvis. Musculoskeletal: No worrisome lytic or sclerotic osseous abnormality. IMPRESSION: 1. 4.3 x 3.3 x 3.9 cm multiloculated structure in the right adnexal space, progressive since 11/10/2021. On the current study, this has an appearance more suggestive of a multiloculated adnexal mass than a coiled fallopian tube although their does appear to be a dilated fallopian draped along the cranial margin of the structure. Findings may reflect multiloculated cystic mass of the right ovary, tubo-ovarian abscess, or a coiled dilated fallopian tube. Pelvic ultrasound recommended to further evaluate. 2. Similar 3.0 x 2.6 cm lesion in the left adnexal space. 3. Trace free fluid in the pelvis. 4. 7 mm hypervascular lesion lateral segment left liver is statistically most likely benign and may be a flash filling hemangioma or tiny vascular malformation. No  followup imaging is recommended. Electronically Signed   By: Misty Stanley M.D.   On: 09/18/2022 17:03    DG Chest Portable 1 View   Result Date: 09/18/2022 CLINICAL DATA:  Vomiting for 2 days with a cough. EXAM: PORTABLE CHEST 1 VIEW COMPARISON:  Chest radiograph dated 09/20/2015. FINDINGS: The heart size and mediastinal contours are within normal limits. Both lungs are clear. The visualized skeletal structures are unremarkable. IMPRESSION: No active disease. Electronically Signed   By: Zerita Boers M.D.   On: 09/18/2022 15:04     Procedures Procedures      Medications Ordered in ED Medications  amLODipine (NORVASC) tablet 5 mg (0 mg Oral Hold 09/18/22 1952)  doxycycline (VIBRAMYCIN) 100 mg in sodium chloride 0.9 % 250 mL IVPB (has no administration in time range)  cefTRIAXone (ROCEPHIN) 2 g in sodium chloride 0.9 % 100 mL IVPB (has no administration in time range)  lactated ringers bolus 1,000 mL (has no administration in time range)  droperidol (INAPSINE) 2.5 MG/ML injection 2.5 mg (2.5 mg Intravenous Given 09/18/22 1417)  lactated ringers bolus 1,000 mL (0 mLs Intravenous Stopped 09/18/22 1732)  famotidine (PEPCID) IVPB 20 mg premix (0 mg Intravenous Stopped 09/18/22 1531)  fentaNYL (SUBLIMAZE) injection 50 mcg (50 mcg Intravenous Given 09/18/22 1417)  iohexol (OMNIPAQUE) 300 MG/ML solution 100 mL (100 mLs Intravenous Contrast Given 09/18/22 1642)  fentaNYL (SUBLIMAZE) injection 50 mcg (50 mcg Intravenous Given 09/18/22 1914)  cefTRIAXone (ROCEPHIN) injection 500 mg (500 mg Intramuscular Given 09/18/22 1949)  lidocaine (XYLOCAINE) 2 % (with pres) injection (1 mL Intramuscular Given 09/18/22 1952)      ED Course/ Medical Decision Making/ A&P    Clinical Course as of 09/18/22 2020  Sat Sep 18, 2022  1352 WBC(!): 19.3 [JL]  1906 Amphetamines(!): POSITIVE [JL]  1906 Tetrahydrocannabinol(!): POSITIVE [JL]     Clinical Course User Index [JL] Ernie Avena, MD                             Medical Decision Making Amount and/or Complexity of Data Reviewed Labs: ordered. Decision-making details documented in ED Course. Radiology: ordered.   Risk Prescription drug management.      36 year old female with medical history significant for alcohol abuse, hypertension, gastroparesis, GERD, depression, History of cannabis hyperemesis who presents emergency department with multiple complaints.  The patient states that she has been having episodes of emesis since this past Thursday.  She has been smoking cannabis and has a history of emesis after cannabis use in the past.  She states that her emesis has since turned dark and slightly coffee-ground.  She also endorses right lower quadrant abdominal pain.  She denies any diarrhea, fevers or chills.  She has not had a bowel movement in the past couple of days but has had no oral intake in that same amount of time.  She is passing gas.  She denies any vaginal bleeding or vaginal discharge.     Vitals and telemetry on arrival: Afebrile, not tachycardic or tachypneic, BP 145/103, saturating 99% on room air.  Sinus rhythm noted on cardiac telemetry   Pertinent exam findings include:Right lower quadrant tenderness to palpation, no rebound or guarding, positive cervical motion tenderness, purulence noted coming from the cervix, right-sided adnexal tenderness present   Differential Diagnosis: PID, tubo-ovarian abscess, ovarian torsion, appendicitis, ovarian cyst, endometrioma.  Also considered , Bowel Obstruction, Pyelonephritis, Nephrolithiasis, Pancreatitis, Cholecystitis, Shingles, Perforated Bowel or Ulcer, Diverticulosis/itis, Ischemic Mesentery, Inflammatory Bowel Disease, Strangulated/Incarcerated Hernia.  Also strongly favored cannabis hyperemesis syndrome given the patient's history.   Lab results include: Leukocytosis to 19.3, no anemia, CMP with mild hypokalemia to 3.4, otherwise unremarkable, initial troponin normal, hCG normal,  magnesium 1.9, UDS with positive for amphetamines and THC, urinalysis without evidence of UTI, ketones present   Imaging results include: Chest x-ray unremarkable CT abdomen pelvis: IMPRESSION:  1. 4.3 x 3.3 x 3.9 cm multiloculated structure in the right adnexal  space, progressive since 11/10/2021. On the current study, this has  an appearance more suggestive of a multiloculated adnexal mass than  a coiled fallopian tube although their does appear to be a dilated  fallopian draped along the cranial margin of the structure.  Findings may reflect multiloculated cystic mass of the right ovary,  tubo-ovarian abscess, or a coiled dilated fallopian tube. Pelvic  ultrasound recommended to further evaluate.  2. Similar 3.0 x 2.6 cm lesion in the left adnexal space.  3. Trace free fluid in the pelvis.  4. 7 mm hypervascular lesion lateral segment left liver is  statistically most likely benign and may be a flash filling  hemangioma or tiny vascular malformation. No followup imaging is  recommended.    Pelvic ultrasound: IMPRESSION:  1. 1.9 cm heterogeneous lesion in the right ovary. This may  represent a hemorrhagic cyst or endometrioma. Given the patient has  pain in the right lower quadrant this does not represent  an  incidental finding. The tubular structure suggesting dilated  fallopian tube on CT is not visualized on ultrasound. Appearance may  be related to multiple cysts/follicles including hemorrhagic  cyst/follicle. Correlation for signs/symptoms of tubo-ovarian  abscess recommended. Arterial and venous flow is detectable in the  right ovary making torsion unlikely. Gyn consultation recommended.  Short-term follow-up pelvic ultrasound may prove helpful to ensure  resolution  2. 2.0 cm complex cyst in the left ovary. This may be a hemorrhagic  cyst . This can also be reassessed on follow-up imaging.  3. Trace free fluid in the pelvis.  4. No evidence for ovarian torsion.       Course of tx has consisted of: Patient was initially treated for cannabis hyperemesis syndrome with IV Pepcid, IV droperidol, IV fluid bolus 1 L, IV fentanyl.  Following these interventions, the patient had resolution of her nausea and vomiting.  She continued to endorse persistent right lower quadrant, right pelvic pain.  Her pelvic exam was concerning for possible infection and her imaging, exam and labs are concerning for possible developing tubo-ovarian abscess/PID. She was covered with IV Rocephin and Doxycycline.   I spoke with Dr. Despina Hidden of OBGYN who accepted the patient in admission at [facility]. The patient was updated regarding the plan of care.   Final Clinical Impression(s) / ED Diagnoses Final diagnoses:  Cannabis hyperemesis syndrome concurrent with and due to cannabis abuse (HCC)  RLQ abdominal pain  Complex ovarian cyst  PID (acute pelvic inflammatory disease)    [name], MD
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
## 2022-09-20 ENCOUNTER — Encounter (HOSPITAL_COMMUNITY): Payer: Self-pay | Admitting: Obstetrics & Gynecology

## 2022-09-20 LAB — CBC WITH DIFFERENTIAL/PLATELET
Abs Immature Granulocytes: 0.08 10*3/uL — ABNORMAL HIGH (ref 0.00–0.07)
Basophils Absolute: 0 10*3/uL (ref 0.0–0.1)
Basophils Relative: 0 %
Eosinophils Absolute: 0.1 10*3/uL (ref 0.0–0.5)
Eosinophils Relative: 1 %
HCT: 35.1 % — ABNORMAL LOW (ref 36.0–46.0)
Hemoglobin: 11.2 g/dL — ABNORMAL LOW (ref 12.0–15.0)
Immature Granulocytes: 1 %
Lymphocytes Relative: 11 %
Lymphs Abs: 1.6 10*3/uL (ref 0.7–4.0)
MCH: 31.9 pg (ref 26.0–34.0)
MCHC: 31.9 g/dL (ref 30.0–36.0)
MCV: 100 fL (ref 80.0–100.0)
Monocytes Absolute: 0.8 10*3/uL (ref 0.1–1.0)
Monocytes Relative: 5 %
Neutro Abs: 12 10*3/uL — ABNORMAL HIGH (ref 1.7–7.7)
Neutrophils Relative %: 82 %
Platelets: 216 10*3/uL (ref 150–400)
RBC: 3.51 MIL/uL — ABNORMAL LOW (ref 3.87–5.11)
RDW: 13.1 % (ref 11.5–15.5)
WBC: 14.6 10*3/uL — ABNORMAL HIGH (ref 4.0–10.5)
nRBC: 0 % (ref 0.0–0.2)

## 2022-09-20 LAB — GC/CHLAMYDIA PROBE AMP (~~LOC~~) NOT AT ARMC
Chlamydia: NEGATIVE
Comment: NEGATIVE
Comment: NORMAL
Neisseria Gonorrhea: NEGATIVE

## 2022-09-20 MED ORDER — ENALAPRIL MALEATE 2.5 MG PO TABS
10.0000 mg | ORAL_TABLET | Freq: Every day | ORAL | Status: DC
  Administered 2022-09-20 – 2022-09-21 (×2): 10 mg via ORAL
  Filled 2022-09-20 (×2): qty 4

## 2022-09-20 NOTE — Care Management Note (Addendum)
Case Management Note  Patient Details  Name: Taylor Bartlett MRN: [id number] Date of Birth: [birth year]  Subjective/Objective:                  PID (acute pelvic inflammatory disease)  Discharge planning Services  [facility] ; PCP appointment made; Financial counselor    Additional Comments: CM spoke to patient and she verified address and phone number. She shared that she works 1p-11pm but does not have insurance.  She does not drive and uses [employer] or Ly[employer] to get places. CM provided MATCH to the [facility] pharmacy to take care of medications for patient for dc and they will be delivered to patient prior to dc and CM discussed with patient about a PCP.  She shared she does not currently have one and would like one. CM called [facility] and spoke to [place] and CM received and appointment for November 30 th at 10:00 am .( Appt placed in follow up section) No other needs at this time.   Yong Channel, RN 09/20/2022, 2:28 PM
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
## 2022-09-21 ENCOUNTER — Other Ambulatory Visit (HOSPITAL_COMMUNITY): Payer: Self-pay

## 2022-09-21 LAB — CBC WITH DIFFERENTIAL/PLATELET
Abs Immature Granulocytes: 0.09 10*3/uL — ABNORMAL HIGH (ref 0.00–0.07)
Basophils Absolute: 0 10*3/uL (ref 0.0–0.1)
Basophils Relative: 0 %
Eosinophils Absolute: 0.1 10*3/uL (ref 0.0–0.5)
Eosinophils Relative: 1 %
HCT: 32.9 % — ABNORMAL LOW (ref 36.0–46.0)
Hemoglobin: 11.2 g/dL — ABNORMAL LOW (ref 12.0–15.0)
Immature Granulocytes: 1 %
Lymphocytes Relative: 11 %
Lymphs Abs: 1.6 10*3/uL (ref 0.7–4.0)
MCH: 33 pg (ref 26.0–34.0)
MCHC: 34 g/dL (ref 30.0–36.0)
MCV: 97.1 fL (ref 80.0–100.0)
Monocytes Absolute: 1.1 10*3/uL — ABNORMAL HIGH (ref 0.1–1.0)
Monocytes Relative: 7 %
Neutro Abs: 11.4 10*3/uL — ABNORMAL HIGH (ref 1.7–7.7)
Neutrophils Relative %: 80 %
Platelets: 221 10*3/uL (ref 150–400)
RBC: 3.39 MIL/uL — ABNORMAL LOW (ref 3.87–5.11)
RDW: 13 % (ref 11.5–15.5)
WBC: 14.2 10*3/uL — ABNORMAL HIGH (ref 4.0–10.5)
nRBC: 0 % (ref 0.0–0.2)

## 2022-09-21 MED ORDER — OXYCODONE-ACETAMINOPHEN 5-325 MG PO TABS
1.0000 | ORAL_TABLET | ORAL | 0 refills | Status: DC | PRN
  Filled 2022-09-21: qty 30, 5d supply, fill #0

## 2022-09-21 MED ORDER — METRONIDAZOLE 500 MG PO TABS
500.0000 mg | ORAL_TABLET | Freq: Two times a day (BID) | ORAL | 0 refills | Status: DC
  Filled 2022-09-21: qty 20, 10d supply, fill #0

## 2022-09-21 MED ORDER — AMLODIPINE BESYLATE 10 MG PO TABS
10.0000 mg | ORAL_TABLET | Freq: Every day | ORAL | 1 refills | Status: DC
  Filled 2022-09-21: qty 30, 30d supply, fill #0
  Filled 2022-11-16: qty 30, 30d supply, fill #1

## 2022-09-21 MED ORDER — DOXYCYCLINE HYCLATE 100 MG PO TABS
100.0000 mg | ORAL_TABLET | Freq: Two times a day (BID) | ORAL | 0 refills | Status: DC
  Filled 2022-09-21: qty 10, 5d supply, fill #0

## 2022-09-21 MED ORDER — IBUPROFEN 800 MG PO TABS
800.0000 mg | ORAL_TABLET | Freq: Three times a day (TID) | ORAL | 1 refills | Status: DC | PRN
  Filled 2022-09-21: qty 30, 10d supply, fill #0

## 2022-09-21 MED ORDER — ENALAPRIL MALEATE 10 MG PO TABS
10.0000 mg | ORAL_TABLET | Freq: Every day | ORAL | 1 refills | Status: DC
  Filled 2022-09-21: qty 30, 30d supply, fill #0

## 2022-09-21 NOTE — Discharge Summary (Signed)
Physician Discharge Summary  Patient ID: Taylor Bartlett MRN: [id number] DOB/AGE: 36-07-1986 36 y.o.  Admit date: 09/18/2022 Discharge date: 09/21/2022  Admission Diagnoses: TOA  Discharge Diagnoses: TOA  Discharged Condition: good  Hospital Course: Taylor Bartlett was admitted with above Dx. She received IV antibiotics. Her WBC decreased and her abd/pelvic pain resolved.  She progressed to ambulating, voiding, tolerating diet and good oral pain control. She was started on antihypertensive medications with good response Discharge planning assisted pt in obtaining her meds and PCP f/u She was felt to be amendable for discharge home Discharge instructions, medications and follow up were reviewed with pt Pt verbalized understanding and pt was discharged home  Consults: None  Significant Diagnostic Studies: labs and radiology  Treatments: IV hydration and antibiotics  Discharge Exam: Blood pressure (!) 155/89, pulse 91, temperature 98.6 F (37 C), temperature source Oral, resp. rate 17, height 5\' 2"  (1.575 m), weight 60.3 kg, SpO2 98 %.  Lungs clear Heart RRR Abd soft, + BS, non tender GU deferred Ext non tender  Disposition: Discharge disposition: 01-Home or Self Care       Discharge Instructions     Call MD for:  difficulty breathing, headache or visual disturbances   Complete by: As directed    Call MD for:  extreme fatigue   Complete by: As directed    Call MD for:  hives   Complete by: As directed    Call MD for:  persistant dizziness or light-headedness   Complete by: As directed    Call MD for:  persistant nausea and vomiting   Complete by: As directed    Call MD for:  redness, tenderness, or signs of infection (pain, swelling, redness, odor or green/yellow discharge around incision site)   Complete by: As directed    Call MD for:  severe uncontrolled pain   Complete by: As directed    Call MD for:  temperature >100.4   Complete by: As directed     Diet - low sodium heart healthy   Complete by: As directed    Increase activity slowly   Complete by: As directed       Allergies as of 09/21/2022       Reactions   Tomato Anaphylaxis, Itching   Other Nausea And Vomiting   Lettuce- visit to ed        Medication List     STOP taking these medications    famotidine 40 MG tablet Commonly known as: PEPCID   metoCLOPramide 10 MG tablet Commonly known as: REGLAN   omeprazole 20 MG capsule Commonly known as: PRILOSEC   ondansetron 4 MG disintegrating tablet Commonly known as: ZOFRAN-ODT   potassium chloride SA 20 MEQ tablet Commonly known as: KLOR-CON M   promethazine 25 MG suppository Commonly known as: PHENERGAN       TAKE these medications    amLODipine 10 MG tablet Commonly known as: NORVASC Take 1 tablet (10 mg total) by mouth daily. Start taking on: September 22, 2022 What changed:  medication strength how much to take   doxycycline 100 MG capsule Commonly known as: VIBRAMYCIN Take 1 capsule (100 mg total) by mouth 2 (two) times daily.   enalapril 10 MG tablet Commonly known as: VASOTEC Take 1 tablet (10 mg total) by mouth daily. Start taking on: September 22, 2022   ibuprofen 800 MG tablet Commonly known as: ADVIL Take 1 tablet (800 mg total) by mouth 3 (three) times daily with meals as needed for  headache or moderate pain. What changed:  medication strength how much to take when to take this reasons to take this   metroNIDAZOLE 500 MG tablet Commonly known as: FLAGYL Take 1 tablet (500 mg total) by mouth 2 (two) times daily.   OLANZapine 20 MG tablet Commonly known as: ZYPREXA Take 1 tablet (20 mg total) by mouth once nightly at bedtime.   oxyCODONE-acetaminophen 5-325 MG tablet Commonly known as: PERCOCET/ROXICET Take 1 tablet by mouth every 4 (four) hours as needed (moderate to severe pain (when tolerating fluids)).        Follow-up Information     [facility] Follow up on 10/21/2022.   Why: appointment made 10/21/22 at 10:00 am with Rose Phi Clung PA Contact information: [address] [place] [address] [phone]        [facility] [employer] [facility]. Schedule an appointment as soon as possible for a visit in 4 day(s).   Specialty: Obstetrics and Gynecology Contact information: [address] [place] [address] [phone]                Signed: Hermina Staggers 09/21/2022, 10:11 AM

## 2022-09-21 NOTE — Progress Notes (Signed)
   09/21/22 1304  Departure Condition  Departure Condition Good  Mobility at [facility]  Patient/Caregiver Teaching Teach Back Method Used;Discharge instructions reviewed;Follow-up care reviewed;Pain management discussed;Patient/caregiver verbalized understanding;Medications discussed;Prescriptions reviewed (Discharge info given prior)  Departure Mode By self  Was procedural sedation performed on this patient during this visit? No   Patient alert and oriented x4, pain stable.

## 2022-09-21 NOTE — Plan of Care (Signed)

## 2022-09-21 NOTE — Plan of Care (Signed)
  Problem: Education: Goal: Knowledge of General Education information will improve Description: Including pain rating scale, medication(s)/side effects and non-pharmacologic comfort measures 09/21/2022 1118 by Romualdo Bolk, RN Outcome: Adequate for Discharge 09/21/2022 0846 by Romualdo Bolk, RN Outcome: Progressing   Problem: Health Behavior/Discharge Planning: Goal: Ability to manage health-related needs will improve 09/21/2022 1118 by Romualdo Bolk, RN Outcome: Adequate for Discharge 09/21/2022 0846 by Romualdo Bolk, RN Outcome: Progressing   Problem: Clinical Measurements: Goal: Ability to maintain clinical measurements within normal limits will improve 09/21/2022 1118 by Romualdo Bolk, RN Outcome: Adequate for Discharge 09/21/2022 0846 by Romualdo Bolk, RN Outcome: Progressing Goal: Will remain free from infection 09/21/2022 1118 by Romualdo Bolk, RN Outcome: Adequate for Discharge 09/21/2022 0846 by Romualdo Bolk, RN Outcome: Progressing Goal: Diagnostic test results will improve 09/21/2022 1118 by Romualdo Bolk, RN Outcome: Adequate for Discharge 09/21/2022 0846 by Romualdo Bolk, RN Outcome: Progressing Goal: Respiratory complications will improve 09/21/2022 1118 by Romualdo Bolk, RN Outcome: Adequate for Discharge 09/21/2022 0846 by Romualdo Bolk, RN Outcome: Progressing Goal: Cardiovascular complication will be avoided 09/21/2022 1118 by Romualdo Bolk, RN Outcome: Adequate for Discharge 09/21/2022 0846 by Romualdo Bolk, RN Outcome: Progressing   Problem: Activity: Goal: Risk for activity intolerance will decrease 09/21/2022 1118 by Romualdo Bolk, RN Outcome: Adequate for Discharge 09/21/2022 0846 by Romualdo Bolk, RN Outcome: Progressing   Problem: Nutrition: Goal: Adequate nutrition will be maintained 09/21/2022 1118 by Romualdo Bolk, RN Outcome: Adequate for  Discharge 09/21/2022 0846 by Romualdo Bolk, RN Outcome: Progressing   Problem: Coping: Goal: Level of anxiety will decrease 09/21/2022 1118 by Romualdo Bolk, RN Outcome: Adequate for Discharge 09/21/2022 0846 by Romualdo Bolk, RN Outcome: Progressing   Problem: Elimination: Goal: Will not experience complications related to bowel motility 09/21/2022 1118 by Romualdo Bolk, RN Outcome: Adequate for Discharge 09/21/2022 0846 by Romualdo Bolk, RN Outcome: Progressing Goal: Will not experience complications related to urinary retention 09/21/2022 1118 by Romualdo Bolk, RN Outcome: Adequate for Discharge 09/21/2022 0846 by Romualdo Bolk, RN Outcome: Progressing   Problem: Pain Managment: Goal: General experience of comfort will improve 09/21/2022 1118 by Romualdo Bolk, RN Outcome: Adequate for Discharge 09/21/2022 0846 by Romualdo Bolk, RN Outcome: Progressing   Problem: Safety: Goal: Ability to remain free from injury will improve 09/21/2022 1118 by Romualdo Bolk, RN Outcome: Adequate for Discharge 09/21/2022 0846 by Romualdo Bolk, RN Outcome: Progressing   Problem: Skin Integrity: Goal: Risk for impaired skin integrity will decrease 09/21/2022 1118 by Romualdo Bolk, RN Outcome: Adequate for Discharge 09/21/2022 0846 by Romualdo Bolk, RN Outcome: Progressing

## 2022-10-11 ENCOUNTER — Other Ambulatory Visit: Payer: Self-pay

## 2022-10-13 ENCOUNTER — Other Ambulatory Visit (HOSPITAL_COMMUNITY)
Admission: RE | Admit: 2022-10-13 | Discharge: 2022-10-13 | Disposition: A | Discharge status: Home / Self Care | Payer: Self-pay | Source: Ambulatory Visit | Attending: Obstetrics and Gynecology | Admitting: Obstetrics and Gynecology

## 2022-10-13 ENCOUNTER — Other Ambulatory Visit: Payer: Self-pay

## 2022-10-13 ENCOUNTER — Ambulatory Visit (INDEPENDENT_AMBULATORY_CARE_PROVIDER_SITE_OTHER): Payer: Self-pay | Admitting: Obstetrics and Gynecology

## 2022-10-13 ENCOUNTER — Encounter: Payer: Self-pay | Admitting: Obstetrics and Gynecology

## 2022-10-13 VITALS — BP 150/100

## 2022-10-13 DIAGNOSIS — Z124 Encounter for screening for malignant neoplasm of cervix: Secondary | ICD-10-CM

## 2022-10-13 DIAGNOSIS — I10 Essential (primary) hypertension: Secondary | ICD-10-CM

## 2022-10-13 DIAGNOSIS — N730 Acute parametritis and pelvic cellulitis: Secondary | ICD-10-CM

## 2022-10-13 MED ORDER — ENALAPRIL MALEATE 10 MG PO TABS
10.0000 mg | ORAL_TABLET | Freq: Every day | ORAL | 1 refills | Status: AC
  Filled 2022-10-13: qty 30, 30d supply, fill #0
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
## 2022-10-15 ENCOUNTER — Other Ambulatory Visit: Payer: Self-pay

## 2022-10-18 ENCOUNTER — Encounter (HOSPITAL_COMMUNITY): Payer: No Payment, Other | Admitting: Student in an Organized Health Care Education/Training Program

## 2022-10-18 LAB — CYTOLOGY - PAP
Adequacy: ABSENT
Chlamydia: NEGATIVE
Comment: NEGATIVE
Comment: NEGATIVE
Comment: NORMAL
Diagnosis: NEGATIVE
High risk HPV: NEGATIVE
Neisseria Gonorrhea: NEGATIVE

## 2022-10-20 NOTE — Progress Notes (Deleted)
Patient ID: Taylor Bartlett, female   DOB: [birth year], 36 y.o.   MRN: [id number]   After hospitalization for abdominal pain and possible PID 10/28-10/31/2023.  She saw gyn in f/up 10/13/2022.  She is here today for blood pressure management and to establish care.   From ED notes and discharge summary:  Hospital Course: Taylor Bartlett was admitted with above Dx. She received IV antibiotics. Her WBC decreased and her abd/pelvic pain resolved.  She progressed to ambulating, voiding, tolerating diet and good oral pain control. She was started on antihypertensive medications with good response   36 year old female with medical history significant for alcohol abuse, hypertension, gastroparesis, GERD, depression, History of cannabis hyperemesis who presents emergency department with multiple complaints.  The patient states that she has been having episodes of emesis since this past Thursday.  She has been smoking cannabis and has a history of emesis after cannabis use in the past.  She states that her emesis has since turned dark and slightly coffee-ground.  She also endorses right lower quadrant abdominal pain.  She denies any diarrhea, fevers or chills.  She has not had a bowel movement in the past couple of days but has had no oral intake in that same amount of time.  She is passing gas.  She denies any vaginal bleeding or vaginal discharge.    Course of tx has consisted of: Patient was initially treated for cannabis hyperemesis syndrome with IV Pepcid, IV droperidol, IV fluid bolus 1 L, IV fentanyl.  Following these interventions, the patient had resolution of her nausea and vomiting.  She continued to endorse persistent right lower quadrant, right pelvic pain.  Her pelvic exam was concerning for possible infection and her imaging, exam and labs are concerning for possible developing tubo-ovarian abscess/PID. She was covered with IV Rocephin and Doxycycline.

## 2022-10-21 ENCOUNTER — Inpatient Hospital Stay: Payer: Self-pay | Admitting: Physician Assistant

## 2022-10-22 ENCOUNTER — Other Ambulatory Visit: Payer: Self-pay

## 2022-10-22 ENCOUNTER — Encounter (HOSPITAL_COMMUNITY): Payer: Self-pay | Admitting: Student in an Organized Health Care Education/Training Program

## 2022-10-22 ENCOUNTER — Ambulatory Visit (INDEPENDENT_AMBULATORY_CARE_PROVIDER_SITE_OTHER): Payer: No Payment, Other | Admitting: Student in an Organized Health Care Education/Training Program

## 2022-10-22 VITALS — BP 144/105 | HR 72 | Ht 62.0 in | Wt 129.2 lb

## 2022-10-22 DIAGNOSIS — F419 Anxiety disorder, unspecified: Secondary | ICD-10-CM

## 2022-10-22 DIAGNOSIS — F1220 Cannabis dependence, uncomplicated: Secondary | ICD-10-CM | POA: Diagnosis not present

## 2022-10-22 DIAGNOSIS — Z79899 Other long term (current) drug therapy: Secondary | ICD-10-CM

## 2022-10-22 DIAGNOSIS — F3178 Bipolar disorder, in full remission, most recent episode mixed: Secondary | ICD-10-CM | POA: Diagnosis not present

## 2022-10-22 MED ORDER — OLANZAPINE 20 MG PO TABS
20.0000 mg | ORAL_TABLET | Freq: Every day | ORAL | 2 refills | Status: DC
  Filled 2022-10-22 – 2022-11-16 (×2): qty 30, 30d supply, fill #0
  Filled 2022-12-14: qty 30, 30d supply, fill #1
  Filled 2023-01-13: qty 30, 30d supply, fill #2

## 2022-10-22 MED ORDER — SERTRALINE HCL 25 MG PO TABS
25.0000 mg | ORAL_TABLET | Freq: Every day | ORAL | 1 refills | Status: DC
  Filled 2022-10-22 – 2022-12-14 (×3): qty 30, 30d supply, fill #0
  Filled 2023-06-14 (×2): qty 30, 30d supply, fill #1
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
## 2022-10-29 ENCOUNTER — Other Ambulatory Visit: Payer: Self-pay

## 2022-11-12 ENCOUNTER — Other Ambulatory Visit: Payer: Self-pay

## 2022-11-16 ENCOUNTER — Other Ambulatory Visit: Payer: Self-pay

## 2022-11-23 ENCOUNTER — Other Ambulatory Visit: Payer: Self-pay

## 2022-12-03 ENCOUNTER — Encounter (HOSPITAL_COMMUNITY): Payer: Self-pay | Admitting: Student in an Organized Health Care Education/Training Program
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
## 2022-12-14 ENCOUNTER — Other Ambulatory Visit: Payer: Self-pay

## 2022-12-15 ENCOUNTER — Other Ambulatory Visit: Payer: Self-pay

## 2023-01-13 ENCOUNTER — Other Ambulatory Visit: Payer: Self-pay

## 2023-02-08 ENCOUNTER — Other Ambulatory Visit (HOSPITAL_COMMUNITY): Payer: Self-pay | Admitting: Student in an Organized Health Care Education/Training Program

## 2023-02-08 ENCOUNTER — Other Ambulatory Visit: Payer: Self-pay | Admitting: Obstetrics and Gynecology

## 2023-02-08 DIAGNOSIS — F3178 Bipolar disorder, in full remission, most recent episode mixed: Secondary | ICD-10-CM

## 2023-02-09 ENCOUNTER — Other Ambulatory Visit: Payer: Self-pay

## 2023-02-09 MED ORDER — OLANZAPINE 20 MG PO TABS
20.0000 mg | ORAL_TABLET | Freq: Every day | ORAL | 2 refills | Status: DC
  Filled 2023-02-09: qty 30, 30d supply, fill #0
  Filled 2023-03-21: qty 30, 30d supply, fill #1
  Filled 2023-04-20: qty 30, 30d supply, fill #2

## 2023-02-09 NOTE — Telephone Encounter (Signed)
Received message from patient's pharmacy for refill of Zyprexa.  As she has remained stable on it for so long refills were sent and will have staff call to schedule follow up appointment.    Sent: -Zyprexa 20 mg QHS.  30 tablets with 2 refills.    Fatima Sanger MD Resident

## 2023-02-11 ENCOUNTER — Other Ambulatory Visit: Payer: Self-pay

## 2023-02-11 MED ORDER — AMLODIPINE BESYLATE 10 MG PO TABS
10.0000 mg | ORAL_TABLET | Freq: Every day | ORAL | 1 refills | Status: AC
  Filled 2023-02-11 – 2023-06-14 (×2): qty 30, 30d supply, fill #0

## 2023-02-14 ENCOUNTER — Other Ambulatory Visit: Payer: Self-pay

## 2023-02-17 ENCOUNTER — Other Ambulatory Visit: Payer: Self-pay

## 2023-03-21 ENCOUNTER — Other Ambulatory Visit: Payer: Self-pay

## 2023-04-20 ENCOUNTER — Other Ambulatory Visit: Payer: Self-pay

## 2023-04-21 ENCOUNTER — Other Ambulatory Visit: Payer: Self-pay

## 2023-05-20 ENCOUNTER — Other Ambulatory Visit: Payer: Self-pay

## 2023-05-20 ENCOUNTER — Other Ambulatory Visit (HOSPITAL_COMMUNITY): Payer: Self-pay | Admitting: Student in an Organized Health Care Education/Training Program

## 2023-05-20 DIAGNOSIS — F3178 Bipolar disorder, in full remission, most recent episode mixed: Secondary | ICD-10-CM

## 2023-05-20 MED ORDER — OLANZAPINE 20 MG PO TABS
20.0000 mg | ORAL_TABLET | Freq: Every day | ORAL | 0 refills | Status: DC
  Filled 2023-05-20: qty 30, 30d supply, fill #0

## 2023-05-20 NOTE — Telephone Encounter (Signed)
Recevied message from patient's pharmacy for refill of her Zyprexa.  As she has not been seen since 10/2022 a one month refill will be sent but no further refills will be sent until a follow up appointment is made.  Sent: -Zyprexa 20 mg QHS.  30 tablets with 0 refills.    Arna Snipe MD Resident

## 2023-05-23 ENCOUNTER — Other Ambulatory Visit: Payer: Self-pay

## 2023-06-14 ENCOUNTER — Other Ambulatory Visit: Payer: Self-pay

## 2023-06-14 ENCOUNTER — Other Ambulatory Visit (HOSPITAL_COMMUNITY): Payer: Self-pay | Admitting: Student in an Organized Health Care Education/Training Program

## 2023-06-14 DIAGNOSIS — F3178 Bipolar disorder, in full remission, most recent episode mixed: Secondary | ICD-10-CM

## 2023-06-21 ENCOUNTER — Other Ambulatory Visit: Payer: Self-pay

## 2023-06-23 ENCOUNTER — Ambulatory Visit (INDEPENDENT_AMBULATORY_CARE_PROVIDER_SITE_OTHER): Payer: No Payment, Other | Admitting: Mental Health

## 2023-06-23 DIAGNOSIS — F3131 Bipolar disorder, current episode depressed, mild: Secondary | ICD-10-CM | POA: Diagnosis not present

## 2023-06-23 DIAGNOSIS — F1220 Cannabis dependence, uncomplicated: Secondary | ICD-10-CM
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
## 2023-06-24 ENCOUNTER — Other Ambulatory Visit: Payer: Self-pay

## 2023-06-24 ENCOUNTER — Other Ambulatory Visit (HOSPITAL_COMMUNITY): Payer: Self-pay | Admitting: Student in an Organized Health Care Education/Training Program

## 2023-06-24 DIAGNOSIS — F3178 Bipolar disorder, in full remission, most recent episode mixed: Secondary | ICD-10-CM

## 2023-06-24 MED ORDER — OLANZAPINE 20 MG PO TABS
20.0000 mg | ORAL_TABLET | Freq: Every day | ORAL | 0 refills | Status: DC
  Filled 2023-06-24: qty 30, 30d supply, fill #0

## 2023-06-24 NOTE — Telephone Encounter (Signed)
Received message from pharmacy that patient needed refill of her Zyprexa.  She has an appointment scheduled for 8/30 so a 1 month refill will be sent.   Sent: -Zyprexa 20 mg QHS.  30 tablets with 0 refills.    Arna Snipe MD Resident

## 2023-07-22 ENCOUNTER — Other Ambulatory Visit: Payer: Self-pay

## 2023-07-22 ENCOUNTER — Ambulatory Visit (INDEPENDENT_AMBULATORY_CARE_PROVIDER_SITE_OTHER): Payer: No Payment, Other | Admitting: Student in an Organized Health Care Education/Training Program

## 2023-07-22 ENCOUNTER — Encounter (HOSPITAL_COMMUNITY): Payer: Self-pay | Admitting: Student in an Organized Health Care Education/Training Program

## 2023-07-22 DIAGNOSIS — F3178 Bipolar disorder, in full remission, most recent episode mixed: Secondary | ICD-10-CM | POA: Diagnosis not present

## 2023-07-22 DIAGNOSIS — F419 Anxiety disorder, unspecified: Secondary | ICD-10-CM | POA: Diagnosis not present

## 2023-07-22 MED ORDER — OLANZAPINE 20 MG PO TABS
20.0000 mg | ORAL_TABLET | Freq: Every day | ORAL | 2 refills | Status: DC
Start: 2023-07-22 — End: 2023-10-14
  Filled 2023-07-22: qty 30, 30d supply, fill #0
  Filled 2023-08-25: qty 30, 30d supply, fill #1
  Filled 2023-09-19: qty 30, 30d supply, fill #2

## 2023-07-22 MED ORDER — SERTRALINE HCL 25 MG PO TABS
25.0000 mg | ORAL_TABLET | Freq: Every day | ORAL | 2 refills | Status: DC
  Filled 2023-07-22: qty 30, 30d supply, fill #0
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
## 2023-07-26 ENCOUNTER — Other Ambulatory Visit: Payer: Self-pay

## 2023-08-04 IMAGING — CT CT ABD-PELV W/ CM
2 of 4 series · 16 of 46 positions shown, 18 images · IV contrast (OMNIPAQUE 350)
Comparison: CT abdomen pelvis dated 07/20/2020.

CLINICAL DATA: Right lower quadrant abdominal pain.

EXAM:
CT ABDOMEN AND PELVIS WITH CONTRAST
TECHNIQUE: Multidetector CT imaging of the abdomen and pelvis was performed
using the standard protocol following bolus administration of
intravenous contrast.
CONTRAST:  80mL OMNIPAQUE IOHEXOL 350 MG/ML SOLN

[Series 2: axial st · axial · 0.64mm/px · z∈[-492,-147]mm · 13 of 79 slices shown, 15 images]
[im 5/79  soft-tissue]
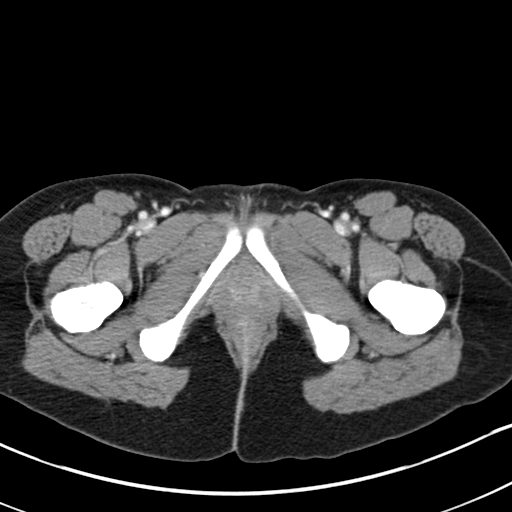
[im 5/79  bone]
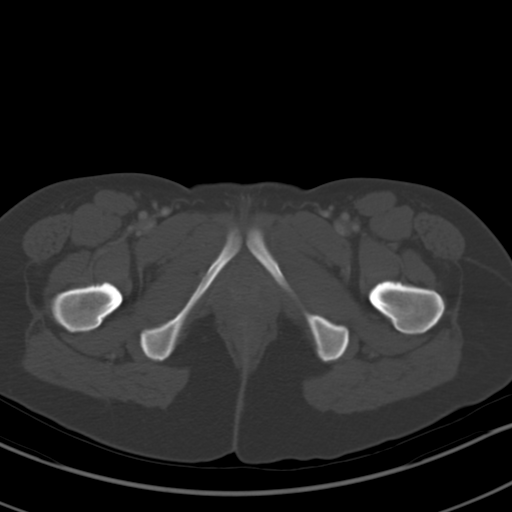
[im 13/79  soft-tissue]
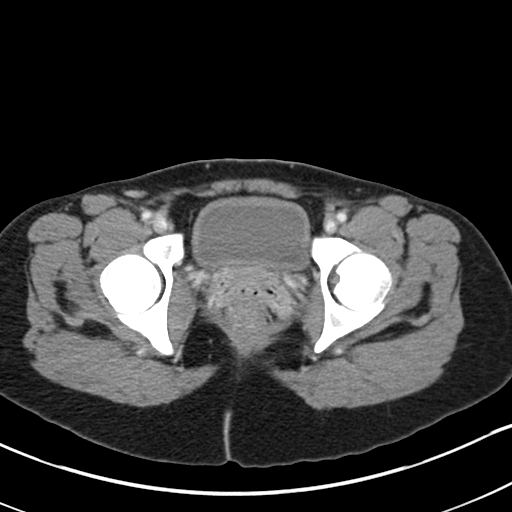
[im 17/79  soft-tissue]
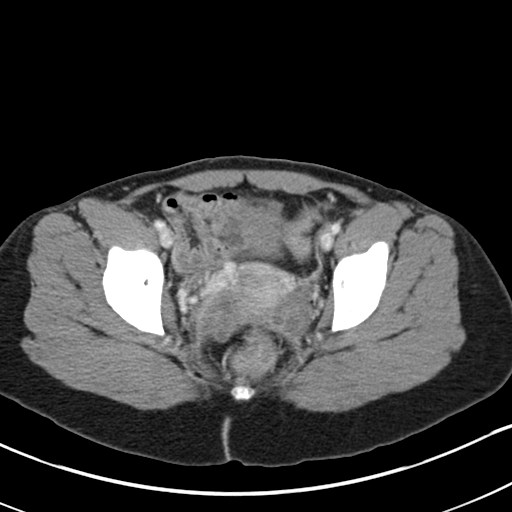
[im 21/79  soft-tissue]
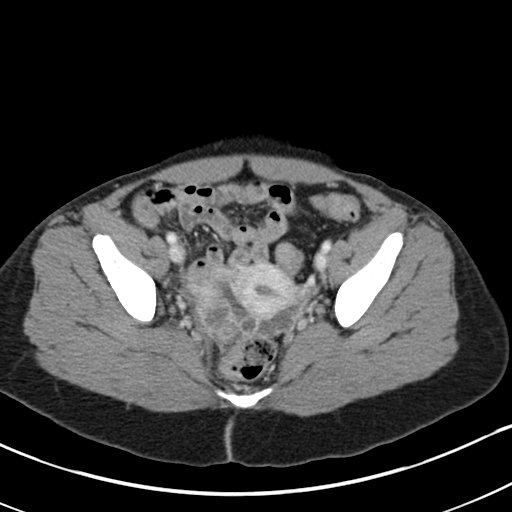
[im 29/79  soft-tissue]
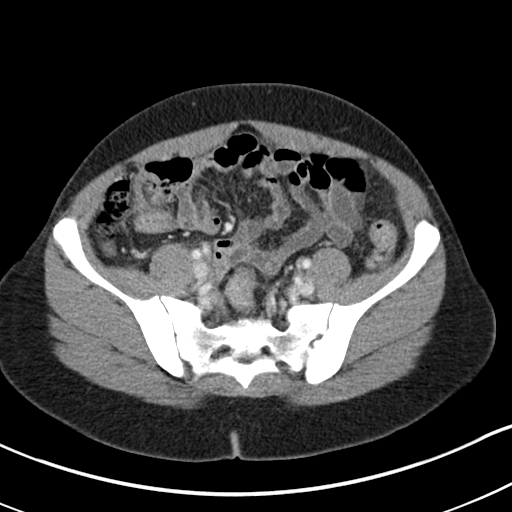
[im 33/79  soft-tissue]
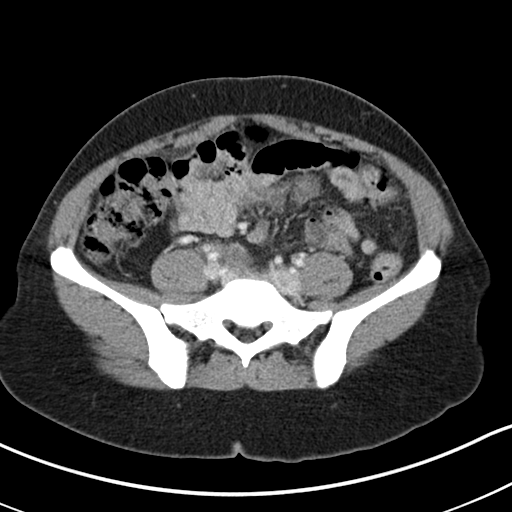
[im 42/79  soft-tissue]
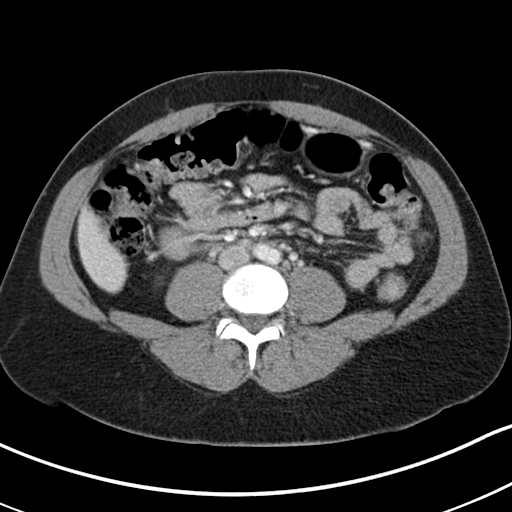
[im 46/79  soft-tissue]
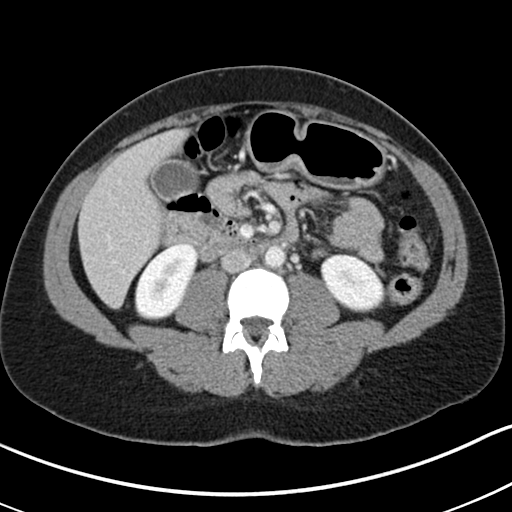
[im 50/79  soft-tissue]
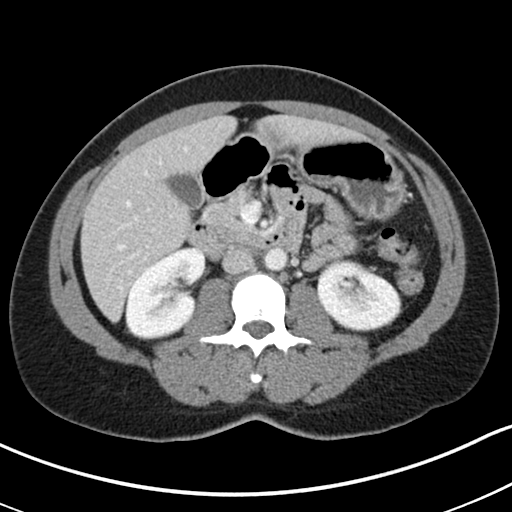
[im 50/79  bone]
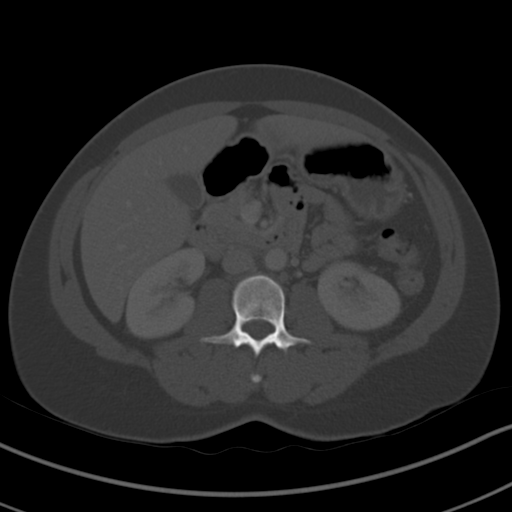
[im 58/79  soft-tissue]
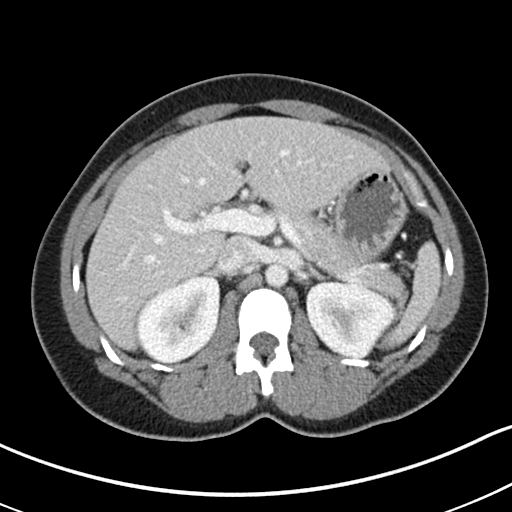
[im 62/79  soft-tissue]
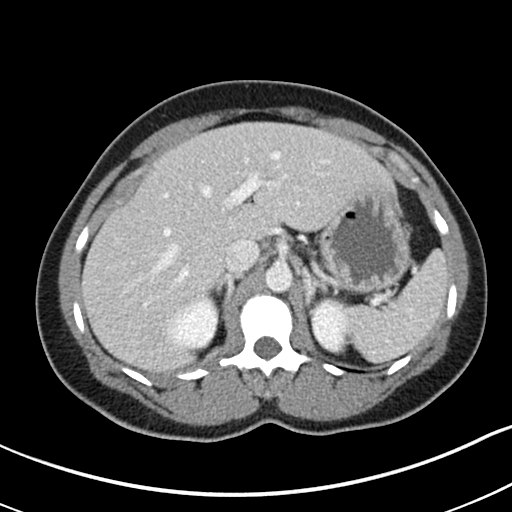
[im 66/79  soft-tissue]
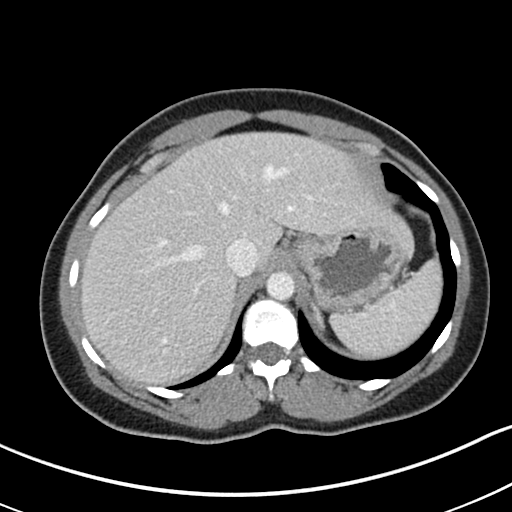
[im 74/79  soft-tissue]
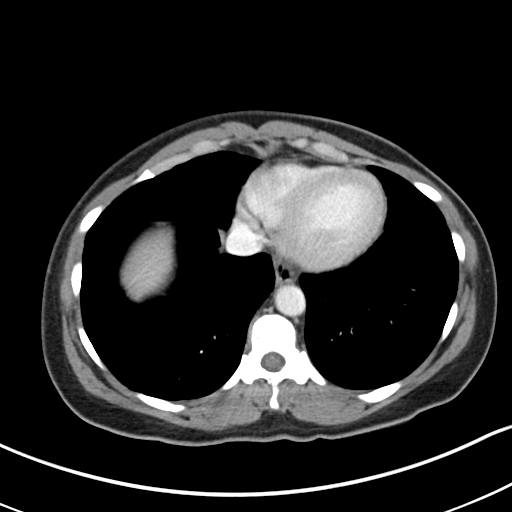

[Series 4: coronal st · coronal · 0.61mm/px · 3 of 121 slices shown]
[im 41/121  soft-tissue]
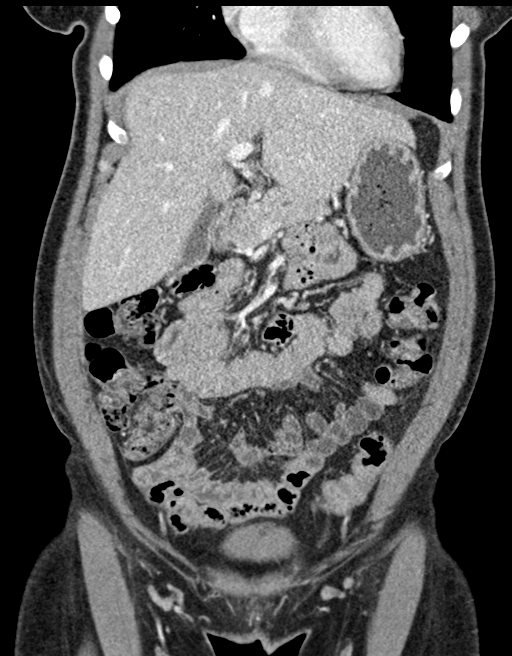
[im 54/121  soft-tissue]
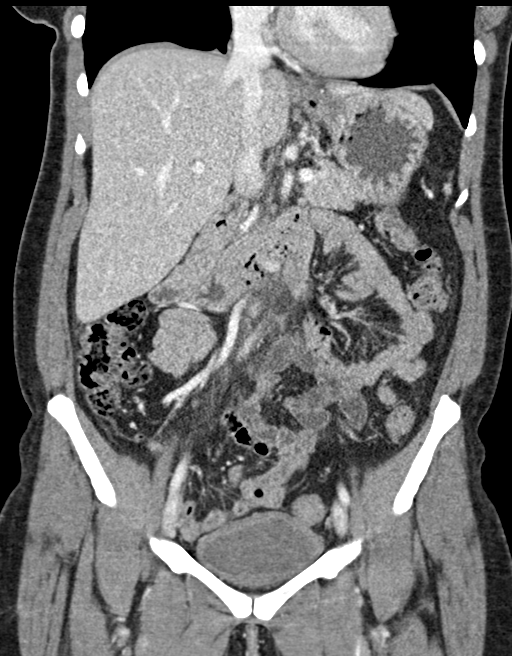
[im 67/121  soft-tissue]
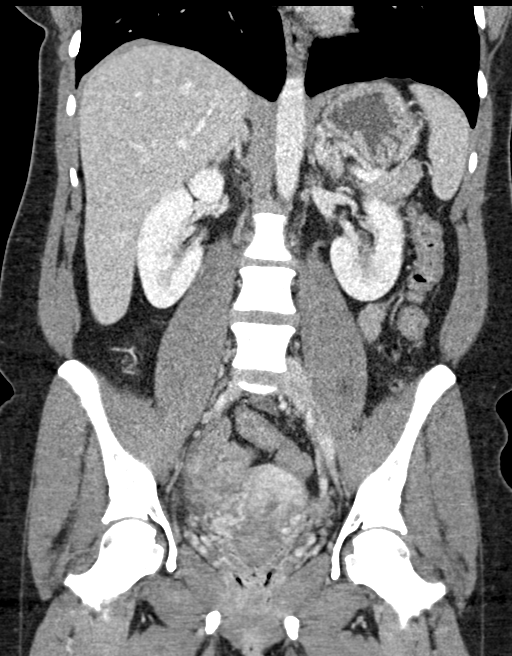

[16 of 46 positions shown; findings below may reference images not displayed]

FINDINGS: Lower chest: The visualized lung bases are clear.

No intra-abdominal free air or free fluid.

Hepatobiliary: No focal liver abnormality is seen. No gallstones,
gallbladder wall thickening, or biliary dilatation.

Pancreas: Unremarkable. No pancreatic ductal dilatation or
surrounding inflammatory changes.

Spleen: Normal in size without focal abnormality.

Adrenals/Urinary Tract: Adrenal glands are unremarkable. Kidneys are
normal, without renal calculi, focal lesion, or hydronephrosis.
Bladder is unremarkable.

Stomach/Bowel: There is no bowel obstruction or active inflammation.
The appendix is normal.

Vascular/Lymphatic: The abdominal aorta and IVC are unremarkable. No
portal venous gas. There is no adenopathy.

Reproductive: The uterus is retroflexed. Mildly dilated tubular
structures in the region of the adnexa with mucosal enhancement and
surrounding stranding and haziness most consistent with salpingitis.
Clinical correlation is recommended to evaluate for pelvic
inflammatory disease. No drainable fluid collection or abscess at
this time.

Other: None

Musculoskeletal: No acute or significant osseous findings.
IMPRESSION: 1. Findings most consistent with salpingitis/PID. No drainable fluid
collection or abscess.
2. No bowel obstruction. Normal appendix.

## 2023-08-25 ENCOUNTER — Other Ambulatory Visit: Payer: Self-pay

## 2023-09-19 ENCOUNTER — Other Ambulatory Visit: Payer: Self-pay

## 2023-09-26 ENCOUNTER — Other Ambulatory Visit: Payer: Self-pay

## 2023-10-14 ENCOUNTER — Encounter (HOSPITAL_COMMUNITY): Payer: Self-pay | Admitting: Student in an Organized Health Care Education/Training Program

## 2023-10-14 ENCOUNTER — Ambulatory Visit (INDEPENDENT_AMBULATORY_CARE_PROVIDER_SITE_OTHER): Payer: No Payment, Other | Admitting: Student in an Organized Health Care Education/Training Program

## 2023-10-14 ENCOUNTER — Other Ambulatory Visit: Payer: Self-pay

## 2023-10-14 VITALS — BP 193/120 | HR 100 | Ht 62.0 in | Wt 135.0 lb

## 2023-10-14 DIAGNOSIS — Z79899 Other long term (current) drug therapy: Secondary | ICD-10-CM | POA: Diagnosis not present

## 2023-10-14 DIAGNOSIS — F3178 Bipolar disorder, in full remission, most recent episode mixed: Secondary | ICD-10-CM | POA: Diagnosis not present

## 2023-10-14 MED ORDER — OLANZAPINE 20 MG PO TABS
20.0000 mg | ORAL_TABLET | Freq: Every day | ORAL | 2 refills | Status: DC
  Filled 2023-10-14 – 2023-10-28 (×2): qty 30, 30d supply, fill #0
  Filled 2023-11-29: qty 30, 30d supply, fill #1
  Filled 2024-01-03: qty 30, 30d supply, fill #2
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
## 2023-10-21 ENCOUNTER — Encounter (HOSPITAL_COMMUNITY): Payer: No Payment, Other | Admitting: Student in an Organized Health Care Education/Training Program

## 2023-10-28 ENCOUNTER — Other Ambulatory Visit: Payer: Self-pay

## 2023-10-31 ENCOUNTER — Other Ambulatory Visit: Payer: Self-pay

## 2023-11-01 ENCOUNTER — Other Ambulatory Visit: Payer: Self-pay

## 2023-11-29 ENCOUNTER — Other Ambulatory Visit: Payer: Self-pay

## 2024-01-03 ENCOUNTER — Other Ambulatory Visit: Payer: Self-pay

## 2024-01-20 ENCOUNTER — Other Ambulatory Visit: Payer: Self-pay

## 2024-01-20 ENCOUNTER — Ambulatory Visit (INDEPENDENT_AMBULATORY_CARE_PROVIDER_SITE_OTHER): Payer: No Payment, Other | Admitting: Student in an Organized Health Care Education/Training Program

## 2024-01-20 VITALS — BP 169/99 | HR 88 | Ht 62.0 in | Wt 135.4 lb

## 2024-01-20 DIAGNOSIS — F3178 Bipolar disorder, in full remission, most recent episode mixed: Secondary | ICD-10-CM | POA: Diagnosis not present

## 2024-01-20 MED ORDER — OLANZAPINE 20 MG PO TABS
20.0000 mg | ORAL_TABLET | Freq: Every day | ORAL | 2 refills | Status: DC
Start: 2024-01-20 — End: 2024-04-13
  Filled 2024-01-20 – 2024-02-03 (×2): qty 30, 30d supply, fill #0
  Filled 2024-03-05: qty 30, 30d supply, fill #1
  Filled 2024-03-28 – 2024-04-06 (×2): qty 30, 30d supply, fill #2

## 2024-01-20 MED ORDER — HYDROXYZINE HCL 25 MG PO TABS
25.0000 mg | ORAL_TABLET | Freq: Three times a day (TID) | ORAL | 1 refills | Status: DC | PRN
  Filled 2024-01-20 – 2024-02-03 (×2): qty 45, 15d supply, fill #0
  Filled 2024-03-05: qty 45, 15d supply, fill #1
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
## 2024-01-31 ENCOUNTER — Other Ambulatory Visit: Payer: Self-pay

## 2024-02-03 ENCOUNTER — Other Ambulatory Visit: Payer: Self-pay

## 2024-03-05 ENCOUNTER — Other Ambulatory Visit: Payer: Self-pay

## 2024-03-06 ENCOUNTER — Ambulatory Visit: Payer: Self-pay | Admitting: Nurse Practitioner

## 2024-03-14 ENCOUNTER — Other Ambulatory Visit: Payer: Self-pay

## 2024-03-28 ENCOUNTER — Other Ambulatory Visit: Payer: Self-pay

## 2024-03-29 ENCOUNTER — Other Ambulatory Visit: Payer: Self-pay

## 2024-04-06 ENCOUNTER — Other Ambulatory Visit: Payer: Self-pay

## 2024-04-13 ENCOUNTER — Other Ambulatory Visit: Payer: Self-pay

## 2024-04-13 ENCOUNTER — Ambulatory Visit (INDEPENDENT_AMBULATORY_CARE_PROVIDER_SITE_OTHER): Admitting: Student in an Organized Health Care Education/Training Program

## 2024-04-13 ENCOUNTER — Encounter (HOSPITAL_COMMUNITY): Payer: Self-pay | Admitting: Student in an Organized Health Care Education/Training Program

## 2024-04-13 DIAGNOSIS — F3178 Bipolar disorder, in full remission, most recent episode mixed: Secondary | ICD-10-CM | POA: Diagnosis not present

## 2024-04-13 MED ORDER — OLANZAPINE 20 MG PO TABS
20.0000 mg | ORAL_TABLET | Freq: Every day | ORAL | 2 refills | Status: DC
Start: 2024-04-13 — End: 2024-07-17
  Filled 2024-04-13 – 2024-05-04 (×2): qty 30, 30d supply, fill #0
  Filled 2024-05-28: qty 30, 30d supply, fill #1
  Filled 2024-07-05: qty 30, 30d supply, fill #2

## 2024-04-13 MED ORDER — HYDROXYZINE HCL 25 MG PO TABS
25.0000 mg | ORAL_TABLET | Freq: Three times a day (TID) | ORAL | 2 refills | Status: DC | PRN
  Filled 2024-04-13: qty 75, 25d supply, fill #0
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
## 2024-04-26 ENCOUNTER — Other Ambulatory Visit: Payer: Self-pay

## 2024-05-04 ENCOUNTER — Other Ambulatory Visit: Payer: Self-pay

## 2024-05-08 ENCOUNTER — Other Ambulatory Visit: Payer: Self-pay

## 2024-05-28 ENCOUNTER — Other Ambulatory Visit: Payer: Self-pay

## 2024-06-23 ENCOUNTER — Emergency Department (HOSPITAL_COMMUNITY): Payer: Self-pay

## 2024-06-23 ENCOUNTER — Observation Stay (HOSPITAL_COMMUNITY)
Admission: EM | Admit: 2024-06-23 | Discharge: 2024-06-24 | Disposition: A | Discharge status: Home / Self Care | Payer: Self-pay | Attending: Emergency Medicine | Admitting: Emergency Medicine

## 2024-06-23 ENCOUNTER — Other Ambulatory Visit: Payer: Self-pay

## 2024-06-23 ENCOUNTER — Encounter (HOSPITAL_COMMUNITY): Payer: Self-pay | Admitting: Internal Medicine

## 2024-06-23 DIAGNOSIS — I10 Essential (primary) hypertension: Secondary | ICD-10-CM

## 2024-06-23 DIAGNOSIS — I160 Hypertensive urgency: Secondary | ICD-10-CM | POA: Insufficient documentation

## 2024-06-23 DIAGNOSIS — F1010 Alcohol abuse, uncomplicated: Secondary | ICD-10-CM | POA: Insufficient documentation

## 2024-06-23 DIAGNOSIS — F12188 Cannabis abuse with other cannabis-induced disorder: Secondary | ICD-10-CM | POA: Insufficient documentation

## 2024-06-23 DIAGNOSIS — F319 Bipolar disorder, unspecified: Secondary | ICD-10-CM | POA: Insufficient documentation

## 2024-06-23 DIAGNOSIS — N83201 Unspecified ovarian cyst, right side: Principal | ICD-10-CM

## 2024-06-23 DIAGNOSIS — F17201 Nicotine dependence, unspecified, in remission: Secondary | ICD-10-CM | POA: Insufficient documentation

## 2024-06-23 DIAGNOSIS — N83291 Other ovarian cyst, right side: Principal | ICD-10-CM | POA: Insufficient documentation

## 2024-06-23 DIAGNOSIS — N83209 Unspecified ovarian cyst, unspecified side: Secondary | ICD-10-CM | POA: Diagnosis present

## 2024-06-23 DIAGNOSIS — R112 Nausea with vomiting, unspecified: Secondary | ICD-10-CM | POA: Diagnosis present

## 2024-06-23 LAB — COMPREHENSIVE METABOLIC PANEL WITH GFR
ALT: 14 U/L (ref 0–44)
AST: 22 U/L (ref 15–41)
Albumin: 4.1 g/dL (ref 3.5–5.0)
Alkaline Phosphatase: 68 U/L (ref 38–126)
Anion gap: 10 (ref 5–15)
BUN: 10 mg/dL (ref 6–20)
CO2: 23 mmol/L (ref 22–32)
Calcium: 9.4 mg/dL (ref 8.9–10.3)
Chloride: 107 mmol/L (ref 98–111)
Creatinine, Ser: 0.72 mg/dL (ref 0.44–1.00)
GFR, Estimated: >60 mL/min (ref >60)
Glucose, Bld: 119 mg/dL — ABNORMAL HIGH (ref 70–99)
Potassium: 3.9 mmol/L (ref 3.5–5.1)
Sodium: 140 mmol/L (ref 135–145)
Total Bilirubin: 0.7 mg/dL (ref 0.0–1.2)
Total Protein: 8.2 g/dL — ABNORMAL HIGH (ref 6.5–8.1)

## 2024-06-23 LAB — URINALYSIS, ROUTINE W REFLEX MICROSCOPIC
Bacteria, UA: NONE SEEN
Bilirubin Urine: NEGATIVE
Glucose, UA: NEGATIVE mg/dL
Ketones, ur: NEGATIVE mg/dL
Leukocytes,Ua: NEGATIVE
Nitrite: NEGATIVE
Protein, ur: 100 mg/dL — AB
Specific Gravity, Urine: 1.014 (ref 1.005–1.030)
pH: 5 (ref 5.0–8.0)

## 2024-06-23 LAB — CK: Total CK: 181 U/L (ref 38–234)

## 2024-06-23 LAB — LIPASE, BLOOD: Lipase: 31 U/L (ref 11–51)

## 2024-06-23 LAB — CBC
HCT: 44.4 % (ref 36.0–46.0)
Hemoglobin: 14 g/dL (ref 12.0–15.0)
MCH: 31.5 pg (ref 26.0–34.0)
MCHC: 31.5 g/dL (ref 30.0–36.0)
MCV: 100 fL (ref 80.0–100.0)
Platelets: 234 K/uL (ref 150–400)
RBC: 4.44 MIL/uL (ref 3.87–5.11)
RDW: 13.2 % (ref 11.5–15.5)
WBC: 11 K/uL — ABNORMAL HIGH (ref 4.0–10.5)
nRBC: 0 % (ref 0.0–0.2)

## 2024-06-23 LAB — HCG, SERUM, QUALITATIVE: Preg, Serum: NEGATIVE

## 2024-06-23 MED ORDER — KETOROLAC TROMETHAMINE 15 MG/ML IJ SOLN
15.0000 mg | Freq: Once | INTRAMUSCULAR | Status: AC
  Administered 2024-06-23: 15 mg via INTRAVENOUS
  Filled 2024-06-23: qty 1

## 2024-06-23 MED ORDER — MELATONIN 3 MG PO TABS: 6.0000 mg | ORAL_TABLET | Freq: Every evening | ORAL | Status: DC | PRN

## 2024-06-23 MED ORDER — HYDRALAZINE HCL 20 MG/ML IJ SOLN
5.0000 mg | INTRAMUSCULAR | Status: DC | PRN
  Filled 2024-06-23: qty 1

## 2024-06-23 MED ORDER — MORPHINE SULFATE (PF) 4 MG/ML IV SOLN
4.0000 mg | Freq: Once | INTRAVENOUS | Status: AC
  Administered 2024-06-23: 4 mg via INTRAVENOUS
  Filled 2024-06-23: qty 1

## 2024-06-23 MED ORDER — NICOTINE 7 MG/24HR TD PT24
7.0000 mg | MEDICATED_PATCH | Freq: Every day | TRANSDERMAL | Status: DC
  Administered 2024-06-23 – 2024-06-24 (×2): 7 mg via TRANSDERMAL
  Filled 2024-06-23 (×2): qty 1

## 2024-06-23 MED ORDER — ONDANSETRON HCL 4 MG/2ML IJ SOLN: 4.0000 mg | Freq: Four times a day (QID) | INTRAMUSCULAR | Status: DC | PRN

## 2024-06-23 MED ORDER — ONDANSETRON HCL 4 MG/2ML IJ SOLN
4.0000 mg | Freq: Once | INTRAMUSCULAR | Status: AC
  Administered 2024-06-23: 4 mg via INTRAVENOUS
  Filled 2024-06-23: qty 2

## 2024-06-23 MED ORDER — HYDROMORPHONE HCL 1 MG/ML IJ SOLN
1.0000 mg | Freq: Once | INTRAMUSCULAR | Status: AC
  Administered 2024-06-23: 1 mg via INTRAVENOUS
  Filled 2024-06-23: qty 1

## 2024-06-23 MED ORDER — IOHEXOL 300 MG/ML  SOLN
100.0000 mL | Freq: Once | INTRAMUSCULAR | Status: AC | PRN
  Administered 2024-06-23: 100 mL via INTRAVENOUS

## 2024-06-23 MED ORDER — PANTOPRAZOLE SODIUM 40 MG IV SOLR
40.0000 mg | Freq: Two times a day (BID) | INTRAVENOUS | Status: DC
  Administered 2024-06-23 – 2024-06-24 (×2): 40 mg via INTRAVENOUS
  Filled 2024-06-23 (×2): qty 10

## 2024-06-23 MED ORDER — OLANZAPINE 10 MG PO TABS
20.0000 mg | ORAL_TABLET | Freq: Every day | ORAL | Status: DC
  Administered 2024-06-23: 20 mg via ORAL
  Filled 2024-06-23: qty 4
  Filled 2024-06-23 (×2): qty 2

## 2024-06-23 MED ORDER — NIFEDIPINE ER OSMOTIC RELEASE 30 MG PO TB24
30.0000 mg | ORAL_TABLET | Freq: Every day | ORAL | Status: DC
  Administered 2024-06-23: 30 mg via ORAL
  Filled 2024-06-23 (×2): qty 1

## 2024-06-23 MED ORDER — HYDRALAZINE HCL 20 MG/ML IJ SOLN
10.0000 mg | Freq: Once | INTRAMUSCULAR | Status: AC
  Administered 2024-06-23: 10 mg via INTRAVENOUS
  Filled 2024-06-23: qty 1

## 2024-06-23 MED ORDER — HYDROMORPHONE HCL 1 MG/ML IJ SOLN: 0.5000 mg | INTRAMUSCULAR | Status: DC | PRN

## 2024-06-23 MED ORDER — HYDROXYZINE HCL 25 MG PO TABS: 25.0000 mg | ORAL_TABLET | Freq: Three times a day (TID) | ORAL | Status: DC | PRN

## 2024-06-23 MED ORDER — ACETAMINOPHEN 500 MG PO TABS
1000.0000 mg | ORAL_TABLET | Freq: Four times a day (QID) | ORAL | Status: DC | PRN
  Administered 2024-06-24: 1000 mg via ORAL
  Filled 2024-06-23: qty 2

## 2024-06-23 MED ORDER — PROCHLORPERAZINE EDISYLATE 10 MG/2ML IJ SOLN: 10.0000 mg | Freq: Four times a day (QID) | INTRAMUSCULAR | Status: DC | PRN

## 2024-06-23 MED ORDER — POLYETHYLENE GLYCOL 3350 17 G PO PACK: 17.0000 g | PACK | Freq: Every day | ORAL | Status: DC | PRN

## 2024-06-23 MED ORDER — SODIUM CHLORIDE 0.9 % IV SOLN
25.0000 mg | Freq: Once | INTRAVENOUS | Status: AC
  Administered 2024-06-23: 25 mg via INTRAVENOUS
  Filled 2024-06-23: qty 1

## 2024-06-23 MED ORDER — SODIUM CHLORIDE 0.9 % IV BOLUS
1000.0000 mL | Freq: Once | INTRAVENOUS | Status: AC
  Administered 2024-06-23: 1000 mL via INTRAVENOUS

## 2024-06-23 MED ORDER — OXYCODONE HCL 5 MG PO TABS: 2.5000 mg | ORAL_TABLET | ORAL | Status: DC | PRN

## 2024-06-23 MED ORDER — SODIUM CHLORIDE 0.9% FLUSH
3.0000 mL | Freq: Two times a day (BID) | INTRAVENOUS | Status: DC
  Administered 2024-06-24 (×2): 3 mL via INTRAVENOUS

## 2024-06-23 MED ORDER — ALBUTEROL SULFATE (2.5 MG/3ML) 0.083% IN NEBU: 2.5000 mg | INHALATION_SOLUTION | RESPIRATORY_TRACT | Status: DC | PRN

## 2024-06-23 MED ORDER — KETOROLAC TROMETHAMINE 30 MG/ML IJ SOLN: 30.0000 mg | Freq: Four times a day (QID) | INTRAMUSCULAR | Status: DC | PRN

## 2024-06-23 MED ORDER — OXYCODONE HCL 5 MG PO TABS
5.0000 mg | ORAL_TABLET | ORAL | Status: DC | PRN
  Administered 2024-06-23: 5 mg via ORAL
  Filled 2024-06-23: qty 1

## 2024-06-23 MED ORDER — MEGESTROL ACETATE 40 MG PO TABS
40.0000 mg | ORAL_TABLET | Freq: Every day | ORAL | Status: DC
  Administered 2024-06-23: 40 mg via ORAL
  Filled 2024-06-23: qty 1

## 2024-06-23 NOTE — ED Triage Notes (Signed)
 Patient is complaining of N/V and abdominal pain. Denies fever or body aches. Patient states she was having abdominal pain last night and started vomiting this am. Patient pain 9/10.

## 2024-06-23 NOTE — ED Provider Notes (Signed)
  Physical Exam  BP (!) 168/96   Pulse (!) 57   Temp 97.6 F (36.4 C) (Oral)   Resp 15   SpO2 100%   Physical Exam  Procedures  Procedures  ED Course / MDM    Medical Decision Making Amount and/or Complexity of Data Reviewed Radiology: ordered.  Risk Prescription drug management. Decision regarding hospitalization.   Right hemorrhagic cyst (CT and transvag US ) WBC 11 Pain uncontrolled in the ED after morphine  and dilaudid  Nausea and vomiting is improved.  Discussed with Dr. Jayne - 30 days of megestrol  (Megace ) 40 mg every day with outpatient f/u with [facility] Being admitted for pain control H/o HTN  Pending consult with hospitalist for admission for pain control  Discussed with the hospitalist who accepts the patient for admission.      Odell Balls, PA-C 06/23/24 2016    Garrick Charleston, MD 06/23/24 [id number]
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
## 2024-06-24 LAB — BASIC METABOLIC PANEL WITH GFR
Anion gap: 8 (ref 5–15)
BUN: 7 mg/dL (ref 6–20)
CO2: 24 mmol/L (ref 22–32)
Calcium: 8.7 mg/dL — ABNORMAL LOW (ref 8.9–10.3)
Chloride: 103 mmol/L (ref 98–111)
Creatinine, Ser: 0.71 mg/dL (ref 0.44–1.00)
GFR, Estimated: >60 mL/min (ref >60)
Glucose, Bld: 95 mg/dL (ref 70–99)
Potassium: 3.2 mmol/L — ABNORMAL LOW (ref 3.5–5.1)
Sodium: 135 mmol/L (ref 135–145)

## 2024-06-24 LAB — CBC
HCT: 38.3 % (ref 36.0–46.0)
Hemoglobin: 12.2 g/dL (ref 12.0–15.0)
MCH: 32.1 pg (ref 26.0–34.0)
MCHC: 31.9 g/dL (ref 30.0–36.0)
MCV: 100.8 fL — ABNORMAL HIGH (ref 80.0–100.0)
Platelets: 178 K/uL (ref 150–400)
RBC: 3.8 MIL/uL — ABNORMAL LOW (ref 3.87–5.11)
RDW: 13.4 % (ref 11.5–15.5)
WBC: 9 K/uL (ref 4.0–10.5)
nRBC: 0 % (ref 0.0–0.2)

## 2024-06-24 LAB — HIV ANTIBODY (ROUTINE TESTING W REFLEX): HIV Screen 4th Generation wRfx: NONREACTIVE

## 2024-06-24 LAB — MAGNESIUM: Magnesium: 1.9 mg/dL (ref 1.7–2.4)

## 2024-06-24 LAB — PHOSPHORUS: Phosphorus: 2.8 mg/dL (ref 2.5–4.6)

## 2024-06-24 MED ORDER — MEGESTROL ACETATE 40 MG PO TABS: 40.0000 mg | ORAL_TABLET | Freq: Every day | ORAL | 0 refills | Status: AC

## 2024-06-24 MED ORDER — NICOTINE 7 MG/24HR TD PT24: 7.0000 mg | MEDICATED_PATCH | Freq: Every day | TRANSDERMAL | 0 refills | Status: DC

## 2024-06-24 NOTE — Progress Notes (Signed)
 Progress Note   Patient: Taylor Bartlett FMW:[id number] DOB: [birth year] DOA: 06/23/2024     0 DOS: the patient was seen and examined on 06/24/2024   Brief hospital course:The patient  is a 38 y.o. female with hx of untreated hypertension, cyclic vomiting/cannabis hyperemesis, prior GI bleed, remote history of PID, polysubstance use (alcohol, THC) current smoker, bipolar disorder, who presented to [facility] with acute onset of right lower quadrant pain and nausea and vomiting.   In the ED she was found to have a hemorrhagic right ovarian cyst on CT abdomen and pelvis. OB/Gyn was consulted and the recommendation was for Megace  40 mg daily for 30 days and outpatient follow up with OV Gyn.   The patient was admitted. She was given pain control, antiemetics, and blood pressure medication.   Today she states that her pain is gone and that she has no nausea or vomiting. Her diet has been advanced to a soft diet. If she tolerates this, she will be discharged to home.   Assessment and Plan: Hemorrhagic right ovarian cyst Acute RLQ pain x 1 day. Afebrile. WBC 11. CT with suggestion of R hemorrhagic ovarian cyst, no other acute abdominal pathology.  Pelvic ultrasound confirming complex right adnexal cyst, R ovary 4.5 x 3.9 x 3.7 cm; with complex cyst 4.2 x 3.3 x 3.7 cm with fine lacelike septations and debris compatible with hemorrhagic cyst.  - EDP consulted with GYN Dr. Jayne who recommends to start on Megace  40 mg daily x 30 days and  follow-up with GYN outpatient.  -Symptomatic management heat, Tylenol  as needed mild, Toradol  30 mg IV q 6 for moderate, oxycodone  5 mg q4 hr for severe, Dilaudid  0.5 mg IV every 4 hours as needed for breakthrough   Hypertensive urgency Severe range hypertension up to 250/228 in the ED, improved with treatment with hydralazine .  Untreated hypertension since '23.  Previously on amlodipine , enalapril . -Started on nifedipine  XR 30 mg nightly   -Hydralazine  5 mg IV every 4 as needed for SBP greater than 200   Nausea and vomiting ?  Scant upper GI bleeding Multiple episodes of emesis today with 1 scant blood in the ED.  Hemoglobin 14 - For now pantoprazole  40 mg IV twice daily; may have gastritis related to her alcohol use/smoking may benefit from discharge on PPI and GI follow-up - Clear liquid diet - Monitor for recurrent bleeding, serial blood counts  Hypokalemia Likely due to GI losses. Will supplement.   Smoking cessation Smoking about 3 cigarettes/day.  She is precontemplative, does not want to quit - Nicotine  7 mg patch while inpatient per request -- The patient is warned not to smoke while taking Megace  due to increased risk of thrombosis.   Chronic medical problems: Cyclic vomiting/cannabis hyperemesis: Doubt related to above.  Can continue to encourage marijuana cessation. Alcohol use: Continue to encourage moderation, denies heavy use and use. Bipolar disorder: Continue home olanzapine , hydroxyzine  prn   Subjective: The patient is resting comfortably. No new complaints. She wants a regular diet.  Physical Exam: Vitals:   06/23/24 2316 06/24/24 0059 06/24/24 0530 06/24/24 0823  BP:  (!) 167/99 (!) 180/95 (!) 171/93  Pulse:  (!) 58 66 (!) 59  Resp:  15 15   Temp:  98.5 F (36.9 C) 97.9 F (36.6 C)   TempSrc:  Oral Oral   SpO2:  100% 100%   Weight: 57.7 kg     Height: 5' 2 (1.575 m)  Exam:  Constitutional:  The patient is awake, alert, and oriented x 3. No acute distress. Eyes:  pupils and irises appear normal Normal lids and conjunctivae ENMT:  grossly normal hearing  Lips appear normal external ears, nose appear normal Oropharynx: mucosa, tongue,posterior pharynx appear normal Neck:  neck appears normal, no masses, normal ROM, supple no thyromegaly Respiratory:  No increased work of breathing. No wheezes, rales, or rhonchi No tactile fremitus Cardiovascular:  Regular rate and  rhythm No murmurs, ectopy, or gallups. No lateral PMI. No thrills. Abdomen:  Abdomen is soft, non-tender, non-distended No hernias, masses, or organomegaly Normoactive bowel sounds.  Musculoskeletal:  No cyanosis, clubbing, or edema Skin:  No rashes, lesions, ulcers palpation of skin: no induration or nodules Neurologic:  CN 2-12 intact Sensation all 4 extremities intact Psychiatric:  Mental status Mood, affect appropriate Orientation to person, place, time  judgment and insight appear intact  Data Reviewed:  Vitals CBC BMP  Family Communication: None available  Disposition: Status is: Observation The patient remains OBS appropriate and will d/c before 2 midnights.  Planned Discharge Destination: Home    Time spent: 32 minutes  Author: Kelvis Berger, DO 06/24/2024 1:08 PM  For on call review [email or web address].

## 2024-06-24 NOTE — Discharge Summary (Addendum)
 Physician Discharge Summary   Patient: Taylor Bartlett MRN: [id number] DOB: Jan 10, 1986  Admit date:     06/23/2024  Discharge date: 06/24/24  Discharge Physician: Brigida Bureau   PCP: Pcp, No   Recommendations at discharge:    Discharge to home. STOP SMOKING. Continuing to smoke while taking megace  dramatically increases the risk of formation of clots which may be fatal. Follow up with OB/Gyn in 4 weeks. Follow up with PCP in 7-10 days.  Discharge Diagnoses: Principal Problem:   Hemorrhagic ovarian cyst Active Problems:   Nausea and vomiting   Hypertensive urgency Tobacco abuse Resolved Problems:   * No resolved hospital problems. [facility] Course: The patient  is a 38 y.o. female with hx of untreated hypertension, cyclic vomiting/cannabis hyperemesis, prior GI bleed, remote history of PID, polysubstance use (alcohol, THC) current smoker, bipolar disorder, who presented to [facility] with acute onset of right lower quadrant pain and nausea and vomiting.    In the ED she was found to have a hemorrhagic right ovarian cyst on CT abdomen and pelvis. OB/Gyn was consulted and the recommendation was for Megace  40 mg daily for 30 days and outpatient follow up with OV Gyn.    The patient was admitted. She was given pain control, antiemetics, and blood pressure medication.    Today she states that her pain is gone and that she has no nausea or vomiting. Her diet has been advanced to a soft diet.  She has tolerated a soft diet and has been verbally advised that she must not smoke while on Megace . She will be discharged to home.  Assessment and Plan: Hemorrhagic right ovarian cyst Acute RLQ pain x 1 day. Afebrile. WBC 11. CT with suggestion of R hemorrhagic ovarian cyst, no other acute abdominal pathology.  Pelvic ultrasound confirming complex right adnexal cyst, R ovary 4.5 x 3.9 x 3.7 cm; with complex cyst 4.2 x 3.3 x 3.7 cm with fine lacelike septations and debris  compatible with hemorrhagic cyst.  - EDP consulted with GYN Dr. Jayne who recommends to start on Megace  40 mg daily x 30 days and  follow-up with GYN outpatient.  -Symptomatic management heat, Tylenol  as needed mild, Toradol  30 mg IV q 6 for moderate, oxycodone  5 mg q4 hr for severe, Dilaudid  0.5 mg IV every 4 hours as needed for breakthrough  The patient states that her pain has completely resolved, and she is now tolerating her diet. She will be discharged to home.   Hypertensive urgency Severe range hypertension up to 250/228 in the ED, improved with treatment with hydralazine .  Untreated hypertension since '23.  Previously on amlodipine , enalapril . -Started on nifedipine  XR 30 mg nightly  -Hydralazine  5 mg IV every 4 as needed for SBP greater than 200  She will be discharged on amlodipine  and enalapril  as she has been taking previously.   Nausea and vomiting ?  Scant upper GI bleeding Multiple episodes of emesis today with 1 scant blood in the ED.  Hemoglobin 14 Hemoglobin has been relatively stable as the patient was somewhat volume contracted on presentation. Her hemoglobin has dropped from 14.0 to 12.2.  She will be continued on pantoprazole . No evidence for GI bleed.    Hypokalemia Likely due to GI losses. Supplemented.   Smoking cessation Smoking about 3 cigarettes/day.  She is precontemplative, does not want to quit - Nicotine  7 mg patch while inpatient per request -- The patient is warned not to smoke while taking Megace   due to increased risk of thrombosis.   Chronic medical problems: Cyclic vomiting/cannabis hyperemesis: Doubt related to above.  Can continue to encourage marijuana cessation. Alcohol use: Continue to encourage moderation, denies heavy use and use. Bipolar disorder: Continue home olanzapine , hydroxyzine  prn          Consultants: None Procedures performed: None  Disposition: Home Diet recommendation:  Discharge Diet Orders (From admission, onward)      Start     Ordered   06/24/24 0000  Diet - low sodium heart healthy        06/24/24 1607           Regular diet DISCHARGE MEDICATION: Allergies as of 06/24/2024       Reactions   Tomato Anaphylaxis, Itching   Other Nausea And Vomiting   Lettuce- visit to ed        Medication List     TAKE these medications    amLODipine  10 MG tablet Commonly known as: NORVASC  Take 1 tablet (10 mg total) by mouth daily.   enalapril  10 MG tablet Commonly known as: VASOTEC  Take 1 tablet (10 mg total) by mouth daily.   hydrOXYzine  25 MG tablet Commonly known as: ATARAX  Take 1 tablet (25 mg total) by mouth 3 (three) times daily as needed for anxiety.   megestrol  40 MG tablet Commonly known as: MEGACE  Take 1 tablet (40 mg total) by mouth at bedtime.   nicotine  7 mg/24hr patch Commonly known as: NICODERM CQ  - dosed in mg/24 hr Place 1 patch (7 mg total) onto the skin daily. Start taking on: June 25, 2024   OLANZapine  20 MG tablet Commonly known as: ZYPREXA  Take 1 tablet (20 mg total) by mouth once nightly at bedtime.        Discharge Exam: Filed Weights   06/23/24 2316  Weight: 57.7 kg   Constitutional:  The patient is awake, alert, and oriented x 3. No acute distress. Eyes:  pupils and irises appear normal Normal lids and conjunctivae ENMT:  grossly normal hearing  Lips appear normal external ears, nose appear normal Oropharynx: mucosa, tongue,posterior pharynx appear normal Neck:  neck appears normal, no masses, normal ROM, supple no thyromegaly Respiratory:  No increased work of breathing. No wheezes, rales, or rhonchi No tactile fremitus Cardiovascular:  Regular rate and rhythm No murmurs, ectopy, or gallups. No lateral PMI. No thrills. Abdomen:  Abdomen is soft, non-tender, non-distended No hernias, masses, or organomegaly Normoactive bowel sounds.  Musculoskeletal:  No cyanosis, clubbing, or edema Skin:  No rashes, lesions, ulcers palpation  of skin: no induration or nodules Neurologic:  CN 2-12 intact Sensation all 4 extremities intact Psychiatric:  Mental status Mood, affect appropriate Orientation to person, place, time  judgment and insight appear intact  Condition at discharge: fair  The results of significant diagnostics from this hospitalization (including imaging, microbiology, ancillary and laboratory) are listed below for reference.   Imaging Studies: US  Pelvis Complete Result Date: 06/23/2024 CLINICAL DATA:  Concern for right-sided hemorrhagic cyst. EXAM: TRANSABDOMINAL AND TRANSVAGINAL ULTRASOUND OF PELVIS DOPPLER ULTRASOUND OF OVARIES TECHNIQUE: Both transabdominal and transvaginal ultrasound examinations of the pelvis were performed. Transabdominal technique was performed for global imaging of the pelvis including uterus, ovaries, adnexal regions, and pelvic cul-de-sac. It was necessary to proceed with endovaginal exam following the transabdominal exam to visualize the anatomy. Color and duplex Doppler ultrasound was utilized to evaluate blood flow to the ovaries. COMPARISON:  06/23/2024, 09/18/2022. FINDINGS: Uterus Measurements: 5.7 x 2.8 x 3.9 cm = volume:  31.8 mL. No fibroids or other mass visualized. Endometrium Thickness: 6.7 mm.  No focal abnormality visualized. Right ovary Measurements: 4.5 x 3.9 x 3.7 cm = volume: 33.5 mL. There is a complex cyst with fine lace-like septations and debris measuring 4.2 x 3.3 x 3.7 cm, compatible with hemorrhagic cyst. Left ovary Measurements: 2.6 x 2.6 x 2.7 cm. Normal appearance/no adnexal mass. Pulsed Doppler evaluation of both ovaries demonstrates normal low-resistance arterial and venous waveforms. Other findings Small amount of free fluid in the pelvis. IMPRESSION: 1. No evidence of ovarian torsion. 2. Complex cyst in the right adnexa, compatible with hemorrhagic cyst. 3. Small amount of free fluid in the pelvis. Electronically Signed   By: Leita Birmingham M.D.   On: 06/23/2024  18:33   US  Transvaginal Non-OB Result Date: 06/23/2024 CLINICAL DATA:  Concern for right-sided hemorrhagic cyst. EXAM: TRANSABDOMINAL AND TRANSVAGINAL ULTRASOUND OF PELVIS DOPPLER ULTRASOUND OF OVARIES TECHNIQUE: Both transabdominal and transvaginal ultrasound examinations of the pelvis were performed. Transabdominal technique was performed for global imaging of the pelvis including uterus, ovaries, adnexal regions, and pelvic cul-de-sac. It was necessary to proceed with endovaginal exam following the transabdominal exam to visualize the anatomy. Color and duplex Doppler ultrasound was utilized to evaluate blood flow to the ovaries. COMPARISON:  06/23/2024, 09/18/2022. FINDINGS: Uterus Measurements: 5.7 x 2.8 x 3.9 cm = volume: 31.8 mL. No fibroids or other mass visualized. Endometrium Thickness: 6.7 mm.  No focal abnormality visualized. Right ovary Measurements: 4.5 x 3.9 x 3.7 cm = volume: 33.5 mL. There is a complex cyst with fine lace-like septations and debris measuring 4.2 x 3.3 x 3.7 cm, compatible with hemorrhagic cyst. Left ovary Measurements: 2.6 x 2.6 x 2.7 cm. Normal appearance/no adnexal mass. Pulsed Doppler evaluation of both ovaries demonstrates normal low-resistance arterial and venous waveforms. Other findings Small amount of free fluid in the pelvis. IMPRESSION: 1. No evidence of ovarian torsion. 2. Complex cyst in the right adnexa, compatible with hemorrhagic cyst. 3. Small amount of free fluid in the pelvis. Electronically Signed   By: Leita Birmingham M.D.   On: 06/23/2024 18:33   US  Art/Ven Flow Abd Pelv Doppler Result Date: 06/23/2024 CLINICAL DATA:  Concern for right-sided hemorrhagic cyst. EXAM: TRANSABDOMINAL AND TRANSVAGINAL ULTRASOUND OF PELVIS DOPPLER ULTRASOUND OF OVARIES TECHNIQUE: Both transabdominal and transvaginal ultrasound examinations of the pelvis were performed. Transabdominal technique was performed for global imaging of the pelvis including uterus, ovaries, adnexal regions,  and pelvic cul-de-sac. It was necessary to proceed with endovaginal exam following the transabdominal exam to visualize the anatomy. Color and duplex Doppler ultrasound was utilized to evaluate blood flow to the ovaries. COMPARISON:  06/23/2024, 09/18/2022. FINDINGS: Uterus Measurements: 5.7 x 2.8 x 3.9 cm = volume: 31.8 mL. No fibroids or other mass visualized. Endometrium Thickness: 6.7 mm.  No focal abnormality visualized. Right ovary Measurements: 4.5 x 3.9 x 3.7 cm = volume: 33.5 mL. There is a complex cyst with fine lace-like septations and debris measuring 4.2 x 3.3 x 3.7 cm, compatible with hemorrhagic cyst. Left ovary Measurements: 2.6 x 2.6 x 2.7 cm. Normal appearance/no adnexal mass. Pulsed Doppler evaluation of both ovaries demonstrates normal low-resistance arterial and venous waveforms. Other findings Small amount of free fluid in the pelvis. IMPRESSION: 1. No evidence of ovarian torsion. 2. Complex cyst in the right adnexa, compatible with hemorrhagic cyst. 3. Small amount of free fluid in the pelvis. Electronically Signed   By: Leita Birmingham M.D.   On: 06/23/2024 18:33   CT ABDOMEN  PELVIS W CONTRAST Result Date: 06/23/2024 CLINICAL DATA:  R sided abdominal pain, nausea/vomiting. EXAM: CT ABDOMEN AND PELVIS WITH CONTRAST TECHNIQUE: Multidetector CT imaging of the abdomen and pelvis was performed using the standard protocol following bolus administration of intravenous contrast. RADIATION DOSE REDUCTION: This exam was performed according to the departmental dose-optimization program which includes automated exposure control, adjustment of the mA and/or kV according to patient size and/or use of iterative reconstruction technique. CONTRAST:  OMNIPAQUE  IOHEXOL  300 MG/ML  SOLN COMPARISON:  CT scan abdomen and pelvis from 09/18/2022. FINDINGS: Lower chest: There are patchy atelectatic changes in the visualized lung bases. No overt consolidation. No pleural effusion. The heart is normal in size. No  pericardial effusion. Hepatobiliary: The liver is normal in size. Non-cirrhotic configuration. No suspicious mass. These is mild diffuse hepatic steatosis. No intrahepatic or extrahepatic bile duct dilation. No calcified gallstones. Normal gallbladder wall thickness. No pericholecystic inflammatory changes. Pancreas: Unremarkable. No pancreatic ductal dilatation or surrounding inflammatory changes. Spleen: Within normal limits. No focal lesion. Adrenals/Urinary Tract: There is stable, diffuse thickening of bilateral adrenal glands (left > right), without discrete nodule. Findings are nonspecific but mostly associated with adrenal hyperplasia. No suspicious renal mass. No hydronephrosis. No renal or ureteric calculi. Urinary bladder is under distended, precluding optimal assessment. However, no large mass or stones identified. No perivesical fat stranding. Stomach/Bowel: No disproportionate dilation of the small or large bowel loops. No evidence of abnormal bowel wall thickening or inflammatory changes. The appendix is unremarkable. Vascular/Lymphatic: No ascites or pneumoperitoneum. No abdominal or pelvic lymphadenopathy, by size criteria. No aneurysmal dilation of the major abdominal arteries. There are mild peripheral atherosclerotic vascular calcifications of the aorta and its major branches. Reproductive: Not well evaluated on the CT scan exam. Note is made of normal-sized retroverted uterus. No focal lesion. There is a well-circumscribed oval 3.6 x 4.3 cm cystic lesion in the right adnexa, favored to arise from the right ovary. The structure exhibits fluid-fluid level with dependent hyperattenuating areas and is favored to represent a hemorrhagic cyst. No left adnexal mass. Other: The visualized soft tissues and abdominal wall are unremarkable. Musculoskeletal: No suspicious osseous lesions. IMPRESSION: 1. No acute inflammatory process identified within the abdomen or pelvis. 2. There is a well-circumscribed  oval 3.6 x 4.3 cm cystic lesion in the right adnexa, favored to arise from the right ovary. The structure exhibits fluid-fluid level and is favored to represent a hemorrhagic cyst. Correlate clinically. If indicated findings can be better evaluated with pelvic ultrasound. 3. Multiple other nonacute observations, as described above. Aortic Atherosclerosis (ICD10-I70.0). Electronically Signed   By: Ree Molt M.D.   On: 06/23/2024 17:04    Microbiology: Results for orders placed or performed during the hospital encounter of 09/18/22  Wet prep, genital     Status: Abnormal   Collection Time: 09/18/22  8:04 PM  Result Value Ref Range Status   Yeast Wet Prep HPF POC NONE SEEN NONE SEEN Final   Trich, Wet Prep NONE SEEN NONE SEEN Final   Clue Cells Wet Prep HPF POC PRESENT (A) NONE SEEN Final   WBC, Wet Prep HPF POC <10 <10 Final   Sperm NONE SEEN  Final    Comment: Performed at [facility], [address] [place], [place] [address]    Labs: CBC: Recent Labs  Lab 06/23/24 1420 06/24/24 0326  WBC 11.0* 9.0  HGB 14.0 12.2  HCT 44.4 38.3  MCV 100.0 100.8*  PLT 234 178   Basic  Metabolic Panel: Recent Labs  Lab 06/23/24 1420 06/24/24 0326  NA 140 135  K 3.9 3.2*  CL 107 103  CO2 23 24  GLUCOSE 119* 95  BUN 10 7  CREATININE 0.72 0.71  CALCIUM 9.4 8.7*  MG  --  1.9  PHOS  --  2.8   Liver Function Tests: Recent Labs  Lab 06/23/24 1420  AST 22  ALT 14  ALKPHOS 68  BILITOT 0.7  PROT 8.2*  ALBUMIN 4.1   CBG: No results for input(s): GLUCAP in the last 168 hours.  Discharge time spent: greater than 30 minutes.  Signed: Ashe Graybeal, DO [facility] 06/24/2024

## 2024-07-05 ENCOUNTER — Other Ambulatory Visit: Payer: Self-pay
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
## 2024-07-17 ENCOUNTER — Encounter (HOSPITAL_COMMUNITY): Payer: Self-pay | Admitting: Psychiatry

## 2024-07-17 ENCOUNTER — Telehealth (HOSPITAL_COMMUNITY): Admitting: Psychiatry

## 2024-07-17 ENCOUNTER — Other Ambulatory Visit: Payer: Self-pay

## 2024-07-17 DIAGNOSIS — F3178 Bipolar disorder, in full remission, most recent episode mixed: Secondary | ICD-10-CM

## 2024-07-17 DIAGNOSIS — F1220 Cannabis dependence, uncomplicated: Secondary | ICD-10-CM

## 2024-07-17 DIAGNOSIS — I10 Essential (primary) hypertension: Secondary | ICD-10-CM

## 2024-07-17 DIAGNOSIS — Z79899 Other long term (current) drug therapy: Secondary | ICD-10-CM

## 2024-07-17 MED ORDER — NICOTINE 7 MG/24HR TD PT24
7.0000 mg | MEDICATED_PATCH | Freq: Every day | TRANSDERMAL | 0 refills | Status: AC
  Filled 2024-07-17: qty 28, 28d supply, fill #0

## 2024-07-17 MED ORDER — OLANZAPINE 20 MG PO TABS
20.0000 mg | ORAL_TABLET | Freq: Every day | ORAL | 2 refills | Status: DC
Start: 2024-08-05 — End: 2024-09-18
  Filled 2024-07-17 – 2024-08-08 (×2): qty 30, 30d supply, fill #0
  Filled 2024-09-06 (×2): qty 30, 30d supply, fill #1

## 2024-07-17 MED ORDER — HYDROXYZINE HCL 25 MG PO TABS
25.0000 mg | ORAL_TABLET | Freq: Every day | ORAL | 2 refills | Status: DC | PRN
  Filled 2024-07-17 – 2024-09-06 (×2): qty 30, 30d supply, fill #0

## 2024-07-19 NOTE — Addendum Note (Signed)
 Addended by: MERCY DOMINO A on: 07/19/2024 05:36 PM   Modules accepted: Level of Service

## 2024-07-27 ENCOUNTER — Other Ambulatory Visit: Payer: Self-pay

## 2024-08-08 ENCOUNTER — Other Ambulatory Visit: Payer: Self-pay

## 2024-08-13 NOTE — Progress Notes (Signed)
 ERRONEOUS ENCOUNTER - PATIENT UNABLE TO MAKE VIRTUAL APPOINTMENT DUE TO BEING SICK  Interval History:  -did not obtain A1c, lipid panel

## 2024-08-14 ENCOUNTER — Ambulatory Visit: Payer: Self-pay | Admitting: Certified Nurse Midwife

## 2024-08-23 ENCOUNTER — Telehealth (HOSPITAL_COMMUNITY): Payer: Self-pay | Admitting: Psychiatry

## 2024-08-23 ENCOUNTER — Ambulatory Visit: Payer: Self-pay | Admitting: Physician Assistant

## 2024-08-23 ENCOUNTER — Encounter (HOSPITAL_COMMUNITY): Admitting: Psychiatry

## 2024-08-23 NOTE — Telephone Encounter (Signed)
 Patient called front desk this AM stating she was not feeling well and she was switched to virtual. Waited and sent 2 reminders to patient's phone at virtual appointment time. I called patient at virtual appointment time. Patient reports she is throwing up and not able to make appointment. Rescheduled appointment to 10/28 at 10:30am. Confirmed that patient has enough refills of psychotropic medications to make it to next appointment.

## 2024-08-27 ENCOUNTER — Encounter (HOSPITAL_COMMUNITY): Payer: Self-pay

## 2024-08-27 ENCOUNTER — Other Ambulatory Visit (HOSPITAL_COMMUNITY)

## 2024-09-06 ENCOUNTER — Other Ambulatory Visit: Payer: Self-pay

## 2024-09-07 ENCOUNTER — Other Ambulatory Visit: Payer: Self-pay
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
## 2024-09-18 ENCOUNTER — Telehealth (INDEPENDENT_AMBULATORY_CARE_PROVIDER_SITE_OTHER): Admitting: Psychiatry

## 2024-09-18 ENCOUNTER — Other Ambulatory Visit: Payer: Self-pay

## 2024-09-18 DIAGNOSIS — Z79899 Other long term (current) drug therapy: Secondary | ICD-10-CM | POA: Diagnosis not present

## 2024-09-18 DIAGNOSIS — F1220 Cannabis dependence, uncomplicated: Secondary | ICD-10-CM

## 2024-09-18 DIAGNOSIS — F419 Anxiety disorder, unspecified: Secondary | ICD-10-CM | POA: Diagnosis not present

## 2024-09-18 DIAGNOSIS — F3178 Bipolar disorder, in full remission, most recent episode mixed: Secondary | ICD-10-CM | POA: Diagnosis not present

## 2024-09-18 MED ORDER — OLANZAPINE 20 MG PO TABS
20.0000 mg | ORAL_TABLET | Freq: Every day | ORAL | 2 refills | Status: AC
  Filled 2024-09-18 – 2024-10-12 (×2): qty 30, 30d supply, fill #0
  Filled 2024-11-14 (×2): qty 30, 30d supply, fill #1

## 2024-09-18 MED ORDER — HYDROXYZINE HCL 25 MG PO TABS
25.0000 mg | ORAL_TABLET | Freq: Every day | ORAL | 2 refills | Status: AC | PRN
  Filled 2024-09-18: qty 30, 30d supply, fill #0

## 2024-09-18 NOTE — Addendum Note (Signed)
 Addended by: GRAHAM KRABBE on: 09/18/2024 11:59 AM   Modules accepted: Orders

## 2024-09-20 ENCOUNTER — Encounter (HOSPITAL_COMMUNITY): Payer: Self-pay

## 2024-09-25 ENCOUNTER — Ambulatory Visit (HOSPITAL_COMMUNITY): Admitting: Mental Health

## 2024-09-26 ENCOUNTER — Other Ambulatory Visit (INDEPENDENT_AMBULATORY_CARE_PROVIDER_SITE_OTHER)

## 2024-09-26 DIAGNOSIS — F200 Paranoid schizophrenia: Secondary | ICD-10-CM

## 2024-09-26 DIAGNOSIS — F3178 Bipolar disorder, in full remission, most recent episode mixed: Secondary | ICD-10-CM

## 2024-09-26 DIAGNOSIS — Z79899 Other long term (current) drug therapy: Secondary | ICD-10-CM

## 2024-09-26 NOTE — Progress Notes (Signed)
 Patient presented to the office for labs, labs was drawn from the right Ac with no issue and will follow up with provider for results .

## 2024-09-29 LAB — HEMOGLOBIN A1C
Est. average glucose Bld gHb Est-mCnc: 100 mg/dL
Hgb A1c MFr Bld: 5.1 % (ref 4.8–5.6)

## 2024-09-29 LAB — LIPID PANEL
Chol/HDL Ratio: 3.5 ratio (ref 0.0–4.4)
Cholesterol, Total: 168 mg/dL (ref 100–199)
HDL: 48 mg/dL (ref >39)
LDL Chol Calc (NIH): 97 mg/dL (ref 0–99)
Triglycerides: 127 mg/dL (ref 0–149)
VLDL Cholesterol Cal: 23 mg/dL (ref 5–40)

## 2024-09-29 LAB — TSH: TSH: 1.7 u[IU]/mL (ref 0.450–4.500)

## 2024-09-29 LAB — VITAMIN D 25 HYDROXY (VIT D DEFICIENCY, FRACTURES): Vit D, 25-Hydroxy: 12.4 ng/mL — ABNORMAL LOW (ref 30.0–100.0)

## 2024-10-01 ENCOUNTER — Ambulatory Visit (HOSPITAL_COMMUNITY): Payer: Self-pay | Admitting: Psychiatry

## 2024-10-01 ENCOUNTER — Other Ambulatory Visit (HOSPITAL_COMMUNITY): Payer: Self-pay | Admitting: Psychiatry

## 2024-10-01 ENCOUNTER — Other Ambulatory Visit: Payer: Self-pay

## 2024-10-01 DIAGNOSIS — E559 Vitamin D deficiency, unspecified: Secondary | ICD-10-CM

## 2024-10-01 MED ORDER — VITAMIN D (ERGOCALCIFEROL) 1.25 MG (50000 UNIT) PO CAPS
50000.0000 [IU] | ORAL_CAPSULE | ORAL | 0 refills | Status: AC
  Filled 2024-10-01: qty 8, 56d supply, fill #0

## 2024-10-10 ENCOUNTER — Other Ambulatory Visit: Payer: Self-pay

## 2024-10-12 ENCOUNTER — Other Ambulatory Visit: Payer: Self-pay

## 2024-11-14 ENCOUNTER — Other Ambulatory Visit: Payer: Self-pay

## 2024-11-23 ENCOUNTER — Other Ambulatory Visit: Payer: Self-pay

## 2024-12-04 NOTE — Progress Notes (Signed)
 ERRONEOUS ENCOUNTER   Called patient at [phone]. Patient reported that she is getting ready for work and unable to make virtual appointment today. Rescheduled patient for Feb 12th at 2:30pm, virtual.   Interval History:  --no show to therapist 09/2024

## 2024-12-06 ENCOUNTER — Encounter (HOSPITAL_COMMUNITY): Payer: Self-pay

## 2024-12-06 ENCOUNTER — Encounter (HOSPITAL_COMMUNITY): Admitting: Psychiatry

## 2024-12-06 DIAGNOSIS — Z79899 Other long term (current) drug therapy: Secondary | ICD-10-CM

## 2024-12-06 DIAGNOSIS — F419 Anxiety disorder, unspecified: Secondary | ICD-10-CM

## 2024-12-06 DIAGNOSIS — E559 Vitamin D deficiency, unspecified: Secondary | ICD-10-CM

## 2024-12-06 DIAGNOSIS — F3178 Bipolar disorder, in full remission, most recent episode mixed: Secondary | ICD-10-CM

## 2024-12-06 DIAGNOSIS — F1220 Cannabis dependence, uncomplicated: Secondary | ICD-10-CM

## 2024-12-19 ENCOUNTER — Emergency Department (HOSPITAL_COMMUNITY): Payer: Self-pay

## 2024-12-19 ENCOUNTER — Emergency Department (HOSPITAL_COMMUNITY): Admission: EM | Admit: 2024-12-19 | Discharge: 2024-12-20 | Discharge status: Against Medical Advice | Payer: Self-pay

## 2024-12-19 ENCOUNTER — Other Ambulatory Visit: Payer: Self-pay

## 2024-12-19 ENCOUNTER — Encounter (HOSPITAL_COMMUNITY): Payer: Self-pay

## 2024-12-19 DIAGNOSIS — R1031 Right lower quadrant pain: Secondary | ICD-10-CM | POA: Insufficient documentation

## 2024-12-19 DIAGNOSIS — Z5329 Procedure and treatment not carried out because of patient's decision for other reasons: Secondary | ICD-10-CM | POA: Insufficient documentation

## 2024-12-19 LAB — CBC
HCT: 43.5 % (ref 36.0–46.0)
Hemoglobin: 13.7 g/dL (ref 12.0–15.0)
MCH: 32.2 pg (ref 26.0–34.0)
MCHC: 31.5 g/dL (ref 30.0–36.0)
MCV: 102.4 fL — ABNORMAL HIGH (ref 80.0–100.0)
Platelets: 247 10*3/uL (ref 150–400)
RBC: 4.25 MIL/uL (ref 3.87–5.11)
RDW: 13.3 % (ref 11.5–15.5)
WBC: 10.2 10*3/uL (ref 4.0–10.5)
nRBC: 0 % (ref 0.0–0.2)

## 2024-12-19 LAB — COMPREHENSIVE METABOLIC PANEL WITH GFR
ALT: 16 U/L (ref 0–44)
AST: 27 U/L (ref 15–41)
Albumin: 4.7 g/dL (ref 3.5–5.0)
Alkaline Phosphatase: 86 U/L (ref 38–126)
Anion gap: 14 (ref 5–15)
BUN: 9 mg/dL (ref 6–20)
CO2: 24 mmol/L (ref 22–32)
Calcium: 10 mg/dL (ref 8.9–10.3)
Chloride: 102 mmol/L (ref 98–111)
Creatinine, Ser: 0.82 mg/dL (ref 0.44–1.00)
GFR, Estimated: >60 mL/min
Glucose, Bld: 116 mg/dL — ABNORMAL HIGH (ref 70–99)
Potassium: 3.9 mmol/L (ref 3.5–5.1)
Sodium: 141 mmol/L (ref 135–145)
Total Bilirubin: 0.5 mg/dL (ref 0.0–1.2)
Total Protein: 8.3 g/dL — ABNORMAL HIGH (ref 6.5–8.1)

## 2024-12-19 LAB — HCG, SERUM, QUALITATIVE: Preg, Serum: NEGATIVE

## 2024-12-19 LAB — URINALYSIS, ROUTINE W REFLEX MICROSCOPIC
Bacteria, UA: NONE SEEN
Bilirubin Urine: NEGATIVE
Glucose, UA: NEGATIVE mg/dL
Ketones, ur: NEGATIVE mg/dL
Leukocytes,Ua: NEGATIVE
Nitrite: NEGATIVE
Protein, ur: >=300 mg/dL — AB
Specific Gravity, Urine: 1.017 (ref 1.005–1.030)
pH: 5 (ref 5.0–8.0)

## 2024-12-19 LAB — LIPASE, BLOOD: Lipase: 22 U/L (ref 11–51)

## 2024-12-19 MED ORDER — IOHEXOL 300 MG/ML  SOLN: 100.0000 mL | Freq: Once | INTRAMUSCULAR | Status: DC | PRN

## 2024-12-19 MED ORDER — HYDROMORPHONE HCL 1 MG/ML IJ SOLN
1.0000 mg | Freq: Once | INTRAMUSCULAR | Status: AC
  Administered 2024-12-19: 1 mg via INTRAVENOUS
  Filled 2024-12-19: qty 1

## 2024-12-19 MED ORDER — IOHEXOL 300 MG/ML  SOLN
100.0000 mL | Freq: Once | INTRAMUSCULAR | Status: AC | PRN
  Administered 2024-12-19: 100 mL via INTRAVENOUS

## 2024-12-19 MED ORDER — ONDANSETRON HCL 4 MG/2ML IJ SOLN
4.0000 mg | Freq: Once | INTRAMUSCULAR | Status: AC
  Administered 2024-12-19: 4 mg via INTRAVENOUS
  Filled 2024-12-19: qty 2

## 2024-12-19 NOTE — ED Triage Notes (Addendum)
 Pt to ED via [facility] from home c/o abdominal pain, right lower, n/v that started 3 hours ago. 4mg  Zofran  Given by EMS. #[id number].  Last VS: 168/98, hr 72, 99%RA, CBG 112.

## 2024-12-19 NOTE — ED Provider Triage Note (Signed)
 Emergency Medicine Provider Triage Evaluation Note  Taylor Bartlett , a 39 y.o. female  was evaluated in triage.  Pt complains of right lower quadrant pain.  Symptoms began 4 hours ago, associated with nausea/vomiting, history of right sided hemorrhagic ovarian cyst.  Denies fever.  Review of Systems  Positive: As above Negative: As above  Physical Exam  BP (!) 214/111   Pulse 83   Temp 97.8 F (36.6 C) (Oral)   Resp (!) 22   Ht 5' 2 (1.575 m)   Wt 58.5 kg   SpO2 100%   BMI 23.59 kg/m  Gen:   Awake, in acute distress Resp:  Normal effort  MSK:   Moves extremities without difficulty  Other:  Right lower quadrant is significantly tender on exam  Medical Decision Making  Medically screening exam initiated at 7:58 PM.  Appropriate orders placed.  Taylor Bartlett was informed that the remainder of the evaluation will be completed by another provider, this initial triage assessment does not replace that evaluation, and the importance of remaining in the ED until their evaluation is complete.     Glendia Rocky SAILOR, [place] 12/19/24 1958

## 2024-12-19 NOTE — ED Notes (Signed)
 US  to bedside

## 2024-12-19 NOTE — ED Triage Notes (Signed)
 Pt wife is staying with pt while pt waits.
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
## 2025-01-03 ENCOUNTER — Telehealth (HOSPITAL_COMMUNITY): Admitting: Psychiatry
